# Patient Record
Sex: Male | Born: 1955 | Race: Black or African American | Hispanic: No | Marital: Married | Smoking: Former smoker
Health system: Southern US, Community
[De-identification: ages and names within clinical notes are randomized; demographics above are authoritative.]

## PROBLEM LIST (undated history)

## (undated) DIAGNOSIS — B20 Human immunodeficiency virus [HIV] disease: Secondary | ICD-10-CM

## (undated) DIAGNOSIS — F329 Major depressive disorder, single episode, unspecified: Secondary | ICD-10-CM

## (undated) DIAGNOSIS — Z21 Asymptomatic human immunodeficiency virus [HIV] infection status: Secondary | ICD-10-CM

## (undated) DIAGNOSIS — Z720 Tobacco use: Secondary | ICD-10-CM

## (undated) DIAGNOSIS — E119 Type 2 diabetes mellitus without complications: Secondary | ICD-10-CM

## (undated) DIAGNOSIS — F32A Depression, unspecified: Secondary | ICD-10-CM

## (undated) HISTORY — DX: Tobacco use: Z72.0

## (undated) HISTORY — DX: Human immunodeficiency virus (HIV) disease: B20

## (undated) HISTORY — DX: Major depressive disorder, single episode, unspecified: F32.9

## (undated) HISTORY — PX: OTHER SURGICAL HISTORY: SHX169

## (undated) HISTORY — DX: Depression, unspecified: F32.A

## (undated) HISTORY — DX: Asymptomatic human immunodeficiency virus (hiv) infection status: Z21

---

## 2014-05-03 ENCOUNTER — Encounter: Payer: Self-pay | Admitting: Internal Medicine

## 2014-05-03 ENCOUNTER — Ambulatory Visit (INDEPENDENT_AMBULATORY_CARE_PROVIDER_SITE_OTHER): Payer: Managed Care, Other (non HMO) | Admitting: Internal Medicine

## 2014-05-03 ENCOUNTER — Other Ambulatory Visit (INDEPENDENT_AMBULATORY_CARE_PROVIDER_SITE_OTHER): Payer: Managed Care, Other (non HMO)

## 2014-05-03 VITALS — BP 124/86 | HR 62 | Temp 98.0°F | Resp 12 | Ht 69.0 in | Wt 151.8 lb

## 2014-05-03 DIAGNOSIS — Z0000 Encounter for general adult medical examination without abnormal findings: Secondary | ICD-10-CM

## 2014-05-03 DIAGNOSIS — D171 Benign lipomatous neoplasm of skin and subcutaneous tissue of trunk: Secondary | ICD-10-CM | POA: Insufficient documentation

## 2014-05-03 DIAGNOSIS — Z87891 Personal history of nicotine dependence: Secondary | ICD-10-CM | POA: Insufficient documentation

## 2014-05-03 DIAGNOSIS — Z720 Tobacco use: Secondary | ICD-10-CM

## 2014-05-03 LAB — CBC
HCT: 45.5 % (ref 39.0–52.0)
Hemoglobin: 15.1 g/dL (ref 13.0–17.0)
MCHC: 33.1 g/dL (ref 30.0–36.0)
MCV: 81.4 fl (ref 78.0–100.0)
Platelets: 243 10*3/uL (ref 150.0–400.0)
RBC: 5.6 Mil/uL (ref 4.22–5.81)
RDW: 13.3 % (ref 11.5–15.5)
WBC: 6.9 10*3/uL (ref 4.0–10.5)

## 2014-05-03 LAB — LIPID PANEL
CHOLESTEROL: 132 mg/dL (ref 0–200)
HDL: 30.6 mg/dL — ABNORMAL LOW (ref >39.00)
LDL CALC: 80 mg/dL (ref 0–99)
NonHDL: 101.4
TRIGLYCERIDES: 109 mg/dL (ref 0.0–149.0)
Total CHOL/HDL Ratio: 4
VLDL: 21.8 mg/dL (ref 0.0–40.0)

## 2014-05-03 LAB — BASIC METABOLIC PANEL
BUN: 12 mg/dL (ref 6–23)
CALCIUM: 8.7 mg/dL (ref 8.4–10.5)
CO2: 26 mEq/L (ref 19–32)
Chloride: 107 mEq/L (ref 96–112)
Creatinine, Ser: 0.9 mg/dL (ref 0.4–1.5)
GFR: 108.6 mL/min (ref >60.00)
GLUCOSE: 102 mg/dL — AB (ref 70–99)
Potassium: 3.6 mEq/L (ref 3.5–5.1)
Sodium: 138 mEq/L (ref 135–145)

## 2014-05-03 NOTE — Progress Notes (Signed)
Pre visit review using our clinic review tool, if applicable. No additional management support is needed unless otherwise documented below in the visit note. 

## 2014-05-03 NOTE — Progress Notes (Signed)
   Subjective:    Patient ID: Ronald Castaneda, male    DOB: February 06, 1956, 58 y.o.   MRN: [id number]  HPI The patient is a 58 year old male who comes in to establish care today. He has no known past medical history. He does have a spot on his back he wants me to look at. He denies any chest pain, shortness of breath, cough, abdominal pains, diarrhea, constipation, GERD. He denies any joint pain or swelling. He has never had colonoscopy. He does not wish any immunizations today. He is a half pack per day smoker and does not wish to quit at this time although he has thought about it.   Review of Systems  Constitutional: Negative for fever, activity change, appetite change, fatigue and unexpected weight change.  HENT: Negative.   Respiratory: Negative for cough, chest tightness, shortness of breath and wheezing.   Cardiovascular: Negative for chest pain, palpitations and leg swelling.  Gastrointestinal: Negative for abdominal pain, diarrhea, constipation and abdominal distention.  Musculoskeletal: Positive for back pain. Negative for myalgias and arthralgias.       Occasional stiffness after work  Skin: Negative.   Neurological: Negative.       Objective:   Physical Exam  Constitutional: He is oriented to person, place, and time. He appears well-developed and well-nourished. No distress.  HENT:  Head: Normocephalic and atraumatic.  Eyes: EOM are normal.  Neck: Normal range of motion.  Cardiovascular: Normal rate and regular rhythm.   Pulmonary/Chest: Effort normal and breath sounds normal. No respiratory distress. He has no wheezes. He has no rales.  Abdominal: Soft. Bowel sounds are normal. He exhibits no distension. There is no tenderness. There is no rebound.  Neurological: He is alert and oriented to person, place, and time. Coordination normal.  Skin: Skin is warm and dry.  1 cm diameter lipoma upper left scapula.   Filed Vitals:   05/03/14 0845  BP: 124/86  Pulse: 62  Temp: 98 F  (36.7 C)  TempSrc: Oral  Resp: 12  Height: 5\' 9"  (1.753 m)  Weight: 151 lb 12.8 oz (68.856 kg)  SpO2: 98%      Assessment & Plan:

## 2014-05-03 NOTE — Assessment & Plan Note (Signed)
Lesion on back lipoma, spoke with him about removal for cosmetic reasons or if it's irritated. No need for intervention at this time.

## 2014-05-03 NOTE — Patient Instructions (Addendum)
The spot on your back is called a lipoma. This means that it's a little bit of fat that is under the skin. It is not harmful and not cancerous. If it ever hurts you or gets irritated and starts bleeding we can send you to a skin doctor who can take it off. If it is not bothering you we did not need to do anything about it. It may grow a little bit over the years.  We will check your blood work and call you back with the results. Think about getting colon cancer screening.  Colonoscopy A colonoscopy is an exam to look at your colon. This exam can help find lumps (tumors), growths (polyps), bleeding, and redness and puffiness (inflammation) in your colon.  BEFORE THE PROCEDURE  Ask your doctor about changing or stopping your regular medicines.  You may need to drink a large amount of a special liquid (oral bowel prep). You start drinking this the day before your procedure. It will cause you to have watery poop (stool). This cleans out your colon.  Do not eat or drink anything else once you have started the bowel prep, unless your doctor tells you it is safe to do so.  Make plans for someone to drive you home after the procedure. PROCEDURE  You will be given medicine to help you relax (sedative).  You will lie on your side with your knees bent.  A tube with a camera on the end is put in the opening of your butt (anus) and into your colon. Pictures are sent to a computer screen. Your doctor will look for anything that is not normal.  Your doctor may take a tissue sample (biopsy) from your colon to be looked at more closely.  The exam is finished when your doctor has viewed all of the colon. AFTER THE PROCEDURE  Do not drive for 24 hours after the exam.  You may have a small amount of blood in your poop. This is normal.  You may pass gas and have belly (abdominal) cramps. This is normal.  Ask when your test results will be ready. Make sure you get your test results. Document Released:  06/12/2010 Document Revised: 05/15/2013 Document Reviewed: 01/15/2013 [facility] Patient Information 2015 [place], [place]. This information is not intended to replace advice given to you by your health care provider. Make sure you discuss any questions you have with your health care provider.

## 2014-05-03 NOTE — Assessment & Plan Note (Signed)
Talked with patient about colonoscopy and explained procedure and risk of colon cancer. He did understand and asked several questions. He wishes to think further about it at this time and provided more information for him to take home and read. He declines all immunizations today. Check lipid panel, BMP, CBC.

## 2014-05-03 NOTE — Assessment & Plan Note (Signed)
Patient smokes half pack per day. Talked to him about cessation and he thinks now is not a good time. He has thought about it in the past but just doesn't want to try right now. Reminded him about the risks and harms from cigarette smoke.

## 2015-02-20 ENCOUNTER — Ambulatory Visit (INDEPENDENT_AMBULATORY_CARE_PROVIDER_SITE_OTHER): Payer: BLUE CROSS/BLUE SHIELD | Admitting: Internal Medicine

## 2015-02-20 ENCOUNTER — Encounter: Payer: Self-pay | Admitting: Internal Medicine

## 2015-02-20 ENCOUNTER — Other Ambulatory Visit (INDEPENDENT_AMBULATORY_CARE_PROVIDER_SITE_OTHER): Payer: BLUE CROSS/BLUE SHIELD

## 2015-02-20 VITALS — BP 132/84 | HR 68 | Temp 99.5°F | Resp 12 | Ht 69.5 in | Wt 157.4 lb

## 2015-02-20 DIAGNOSIS — D171 Benign lipomatous neoplasm of skin and subcutaneous tissue of trunk: Secondary | ICD-10-CM

## 2015-02-20 DIAGNOSIS — Z0000 Encounter for general adult medical examination without abnormal findings: Secondary | ICD-10-CM

## 2015-02-20 DIAGNOSIS — Z720 Tobacco use: Secondary | ICD-10-CM | POA: Diagnosis not present

## 2015-02-20 LAB — BASIC METABOLIC PANEL
BUN: 12 mg/dL (ref 6–23)
CHLORIDE: 103 meq/L (ref 96–112)
CO2: 33 mEq/L — ABNORMAL HIGH (ref 19–32)
Calcium: 9.3 mg/dL (ref 8.4–10.5)
Creatinine, Ser: 0.95 mg/dL (ref 0.40–1.50)
GFR: 104.36 mL/min (ref >60.00)
GLUCOSE: 106 mg/dL — AB (ref 70–99)
POTASSIUM: 4.1 meq/L (ref 3.5–5.1)
Sodium: 140 mEq/L (ref 135–145)

## 2015-02-20 LAB — HEMOGLOBIN A1C: Hgb A1c MFr Bld: 6.2 % (ref 4.6–6.5)

## 2015-02-20 LAB — LIPID PANEL
CHOLESTEROL: 153 mg/dL (ref 0–200)
HDL: 41.1 mg/dL (ref >39.00)
LDL CALC: 91 mg/dL (ref 0–99)
NonHDL: 111.68
Total CHOL/HDL Ratio: 4
Triglycerides: 104 mg/dL (ref 0.0–149.0)
VLDL: 20.8 mg/dL (ref 0.0–40.0)

## 2015-02-20 NOTE — Patient Instructions (Signed)
We will check the blood work today and call you back about the results.  We will send you to the surgeon who will take off the spot on your back.  You can use ibuprofen or tylenol for the pain in the elbow and below is the stretching and icing regimen for it.   Lateral Epicondylitis (Tennis Elbow) with Rehab Lateral epicondylitis involves inflammation and pain around the outer portion of the elbow. The pain is caused by inflammation of the tendons in the forearm that bring back (extend) the wrist. Lateral epicondylitis is also called tennis elbow, because it is very common in tennis players. However, it may occur in any individual who extends the wrist repetitively. If lateral epicondylitis is left untreated, it may become a chronic problem. SYMPTOMS   Pain, tenderness, and inflammation on the outer (lateral) side of the elbow.  Pain or weakness with gripping activities.  Pain that increases with wrist-twisting motions (playing tennis, using a screwdriver, opening a door or a jar).  Pain with lifting objects, including a coffee cup. CAUSES  Lateral epicondylitis is caused by inflammation of the tendons that extend the wrist. Causes of injury may include:  Repetitive stress and strain on the muscles and tendons that extend the wrist.  Sudden change in activity level or intensity.  Incorrect grip in racquet sports.  Incorrect grip size of racquet (often too large).  Incorrect hitting position or technique (usually backhand, leading with the elbow).  Using a racket that is too heavy. RISK INCREASES WITH:  Sports or occupations that require repetitive and/or strenuous forearm and wrist movements (tennis, squash, racquetball, carpentry).  Poor wrist and forearm strength and flexibility.  Failure to warm up properly before activity.  Resuming activity before healing, rehabilitation, and conditioning are complete. PREVENTION   Warm up and stretch properly before  activity.  Maintain physical fitness:  Strength, flexibility, and endurance.  Cardiovascular fitness.  Wear and use properly fitted equipment.  Learn and use proper technique and have a coach correct improper technique.  Wear a tennis elbow (counterforce) brace. PROGNOSIS  The course of this condition depends on the degree of the injury. If treated properly, acute cases (symptoms lasting less than 4 weeks) are often resolved in 2 to 6 weeks. Chronic (longer lasting cases) often resolve in 3 to 6 months but may require physical therapy. RELATED COMPLICATIONS   Frequently recurring symptoms, resulting in a chronic problem. Properly treating the problem the first time decreases frequency of recurrence.  Chronic inflammation, scarring tendon degeneration, and partial tendon tear, requiring surgery.  Delayed healing or resolution of symptoms. TREATMENT  Treatment first involves the use of ice and medicine to reduce pain and inflammation. Strengthening and stretching exercises may help reduce discomfort if performed regularly. These exercises may be performed at home if the condition is an acute injury. Chronic cases may require a referral to a physical therapist for evaluation and treatment. Your caregiver may advise a corticosteroid injection to help reduce inflammation. Rarely, surgery is needed. MEDICATION  If pain medicine is needed, nonsteroidal anti-inflammatory medicines (aspirin and ibuprofen), or other minor pain relievers (acetaminophen), are often advised.  Do not take pain medicine for 7 days before surgery.  Prescription pain relievers may be given, if your caregiver thinks they are needed. Use only as directed and only as much as you need.  Corticosteroid injections may be recommended. These injections should be reserved only for the most severe cases, because they can only be given a certain number of  times. HEAT AND COLD  Cold treatment (icing) should be applied for 10  to 15 minutes every 2 to 3 hours for inflammation and pain, and immediately after activity that aggravates your symptoms. Use ice packs or an ice massage.  Heat treatment may be used before performing stretching and strengthening activities prescribed by your caregiver, physical therapist, or athletic trainer. Use a heat pack or a warm water soak. SEEK MEDICAL CARE IF: Symptoms get worse or do not improve in 2 weeks, despite treatment. EXERCISES  RANGE OF MOTION (ROM) AND STRETCHING EXERCISES - Epicondylitis, Lateral (Tennis Elbow) These exercises may help you when beginning to rehabilitate your injury. Your symptoms may go away with or without further involvement from your physician, physical therapist, or athletic trainer. While completing these exercises, remember:   Restoring tissue flexibility helps normal motion to return to the joints. This allows healthier, less painful movement and activity.  An effective stretch should be held for at least 30 seconds.  A stretch should never be painful. You should only feel a gentle lengthening or release in the stretched tissue. RANGE OF MOTION - Wrist Flexion, Active-Assisted  Extend your right / left elbow with your fingers pointing down.*  Gently pull the back of your hand towards you, until you feel a gentle stretch on the top of your forearm.  Hold this position for __________ seconds. Repeat __________ times. Complete this exercise __________ times per day.  *If directed by your physician, physical therapist or athletic trainer, complete this stretch with your elbow bent, rather than extended. RANGE OF MOTION - Wrist Extension, Active-Assisted  Extend your right / left elbow and turn your palm upwards.*  Gently pull your palm and fingertips back, so your wrist extends and your fingers point more toward the ground.  You should feel a gentle stretch on the inside of your forearm.  Hold this position for __________ seconds. Repeat  __________ times. Complete this exercise __________ times per day. *If directed by your physician, physical therapist or athletic trainer, complete this stretch with your elbow bent, rather than extended. STRETCH - Wrist Flexion  Place the back of your right / left hand on a tabletop, leaving your elbow slightly bent. Your fingers should point away from your body.  Gently press the back of your hand down onto the table by straightening your elbow. You should feel a stretch on the top of your forearm.  Hold this position for __________ seconds. Repeat __________ times. Complete this stretch __________ times per day.  STRETCH - Wrist Extension   Place your right / left fingertips on a tabletop, leaving your elbow slightly bent. Your fingers should point backwards.  Gently press your fingers and palm down onto the table by straightening your elbow. You should feel a stretch on the inside of your forearm.  Hold this position for __________ seconds. Repeat __________ times. Complete this stretch __________ times per day.  STRENGTHENING EXERCISES - Epicondylitis, Lateral (Tennis Elbow) These exercises may help you when beginning to rehabilitate your injury. They may resolve your symptoms with or without further involvement from your physician, physical therapist, or athletic trainer. While completing these exercises, remember:   Muscles can gain both the endurance and the strength needed for everyday activities through controlled exercises.  Complete these exercises as instructed by your physician, physical therapist or athletic trainer. Increase the resistance and repetitions only as guided.  You may experience muscle soreness or fatigue, but the pain or discomfort you are trying to eliminate  should never worsen during these exercises. If this pain does get worse, stop and make sure you are following the directions exactly. If the pain is still present after adjustments, discontinue the exercise  until you can discuss the trouble with your caregiver. STRENGTH - Wrist Flexors  Sit with your right / left forearm palm-up and fully supported on a table or countertop. Your elbow should be resting below the height of your shoulder. Allow your wrist to extend over the edge of the surface.  Loosely holding a __________ weight, or a piece of rubber exercise band or tubing, slowly curl your hand up toward your forearm.  Hold this position for __________ seconds. Slowly lower the wrist back to the starting position in a controlled manner. Repeat __________ times. Complete this exercise __________ times per day.  STRENGTH - Wrist Extensors  Sit with your right / left forearm palm-down and fully supported on a table or countertop. Your elbow should be resting below the height of your shoulder. Allow your wrist to extend over the edge of the surface.  Loosely holding a __________ weight, or a piece of rubber exercise band or tubing, slowly curl your hand up toward your forearm.  Hold this position for __________ seconds. Slowly lower the wrist back to the starting position in a controlled manner. Repeat __________ times. Complete this exercise __________ times per day.  STRENGTH - Ulnar Deviators  Stand with a ____________________ weight in your right / left hand, or sit while holding a rubber exercise band or tubing, with your healthy arm supported on a table or countertop.  Move your wrist, so that your pinkie travels toward your forearm and your thumb moves away from your forearm.  Hold this position for __________ seconds and then slowly lower the wrist back to the starting position. Repeat __________ times. Complete this exercise __________ times per day STRENGTH - Radial Deviators  Stand with a ____________________ weight in your right / left hand, or sit while holding a rubber exercise band or tubing, with your injured arm supported on a table or countertop.  Raise your hand upward in  front of you or pull up on the rubber tubing.  Hold this position for __________ seconds and then slowly lower the wrist back to the starting position. Repeat __________ times. Complete this exercise __________ times per day. STRENGTH - Forearm Supinators   Sit with your right / left forearm supported on a table, keeping your elbow below shoulder height. Rest your hand over the edge, palm down.  Gently grip a hammer or a soup ladle.  Without moving your elbow, slowly turn your palm and hand upward to a "thumbs-up" position.  Hold this position for __________ seconds. Slowly return to the starting position. Repeat __________ times. Complete this exercise __________ times per day.  STRENGTH - Forearm Pronators   Sit with your right / left forearm supported on a table, keeping your elbow below shoulder height. Rest your hand over the edge, palm up.  Gently grip a hammer or a soup ladle.  Without moving your elbow, slowly turn your palm and hand upward to a "thumbs-up" position.  Hold this position for __________ seconds. Slowly return to the starting position. Repeat __________ times. Complete this exercise __________ times per day.  STRENGTH - Grip  Grasp a tennis ball, a dense sponge, or a large, rolled sock in your hand.  Squeeze as hard as you can, without increasing any pain.  Hold this position for __________ seconds. Release  your grip slowly. Repeat __________ times. Complete this exercise __________ times per day.  STRENGTH - Elbow Extensors, Isometric  Stand or sit upright, on a firm surface. Place your right / left arm so that your palm faces your stomach, and it is at the height of your waist.  Place your opposite hand on the underside of your forearm. Gently push up as your right / left arm resists. Push as hard as you can with both arms, without causing any pain or movement at your right / left elbow. Hold this stationary position for __________ seconds. Gradually  release the tension in both arms. Allow your muscles to relax completely before repeating. Document Released: 05/10/2005 Document Revised: 09/24/2013 Document Reviewed: 08/22/2008 [facility] Patient Information 2015 [place], [place]. This information is not intended to replace advice given to you by your health care provider. Make sure you discuss any questions you have with your health care provider.

## 2015-02-20 NOTE — Assessment & Plan Note (Signed)
Referral to general surgery as he wants removed. Reassured him that this is non-cancerous.

## 2015-02-20 NOTE — Progress Notes (Signed)
   Subjective:    Patient ID: Ronald Castaneda, male    DOB: 1955/10/20, 59 y.o.   MRN: [id number]  HPI The patient is a 59 YO man here for wellness. No new concerns. 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 Still smoking some but less than last year.   PMH, [facility], social history reviewed and updated.   Review of Systems  Constitutional: Negative for fever, activity change, appetite change, fatigue and unexpected weight change.  HENT: Negative.   Respiratory: Negative for cough, chest tightness, shortness of breath and wheezing.   Cardiovascular: Negative for chest pain, palpitations and leg swelling.  Gastrointestinal: Negative for abdominal pain, diarrhea, constipation and abdominal distention.  Musculoskeletal: Positive for arthralgias. Negative for myalgias and back pain.       Right elbow  Skin: Negative.   Neurological: Negative.       Objective:   Physical Exam  Constitutional: He is oriented to person, place, and time. He appears well-developed and well-nourished. No distress.  HENT:  Head: Normocephalic and atraumatic.  Eyes: EOM are normal.  Neck: Normal range of motion.  Cardiovascular: Normal rate and regular rhythm.   Pulmonary/Chest: Effort normal and breath sounds normal. No respiratory distress. He has no wheezes. He has no rales.  Abdominal: Soft. Bowel sounds are normal. He exhibits no distension. There is no tenderness. There is no rebound.  Neurological: He is alert and oriented to person, place, and time. Coordination normal.  Skin: Skin is warm and dry.  1 cm diameter lipoma upper left scapula.   Filed Vitals:   02/20/15 1053  BP: 132/84  Pulse: 68  Temp: 99.5 F (37.5 C)  TempSrc: Oral  Resp: 12  Height: 5' 9.5" (1.765 m)  Weight: 157 lb 6.4 oz (71.396 kg)  SpO2: 99%      Assessment & Plan:

## 2015-02-20 NOTE — Assessment & Plan Note (Signed)
Still smoking but less than last year. He has thought about quitting but feels that this is not a good option at this time.

## 2015-02-20 NOTE — Assessment & Plan Note (Signed)
Patient does not want colonoscopy. Declines all immunizations. Checking labs including screening for hep c and HIV. Counseled him about the risks and harms of cigarette smoke.

## 2015-02-20 NOTE — Progress Notes (Signed)
Pre visit review using our clinic review tool, if applicable. No additional management support is needed unless otherwise documented below in the visit note. 

## 2015-02-21 LAB — HIV 1/2 CONFIRMATION
HIV 1 ANTIBODY: POSITIVE — AB
HIV-2 Ab: NEGATIVE

## 2015-02-21 LAB — HIV ANTIBODY (ROUTINE TESTING W REFLEX): HIV: REACTIVE — AB

## 2015-02-21 LAB — HEPATITIS C ANTIBODY: HCV AB: NEGATIVE

## 2015-02-24 ENCOUNTER — Telehealth: Payer: Self-pay | Admitting: Geriatric Medicine

## 2015-02-24 ENCOUNTER — Other Ambulatory Visit: Payer: Self-pay | Admitting: Internal Medicine

## 2015-02-24 DIAGNOSIS — Z21 Asymptomatic human immunodeficiency virus [HIV] infection status: Secondary | ICD-10-CM

## 2015-02-24 NOTE — Telephone Encounter (Signed)
-----   Message from Hoyt Koch, MD sent at 02/24/2015  8:08 AM EDT ----- Please have him come for a visit to discuss some abnormal labs.

## 2015-02-24 NOTE — Telephone Encounter (Signed)
Tried to reach patient. I left a message for patient to call me back to discuss lab results.

## 2015-02-27 ENCOUNTER — Other Ambulatory Visit: Payer: BLUE CROSS/BLUE SHIELD

## 2015-02-27 ENCOUNTER — Ambulatory Visit (INDEPENDENT_AMBULATORY_CARE_PROVIDER_SITE_OTHER): Payer: BLUE CROSS/BLUE SHIELD | Admitting: Internal Medicine

## 2015-02-27 ENCOUNTER — Encounter: Payer: Self-pay | Admitting: Internal Medicine

## 2015-02-27 VITALS — BP 136/80 | HR 75 | Temp 98.6°F | Resp 14 | Ht 69.5 in | Wt 151.0 lb

## 2015-02-27 DIAGNOSIS — Z21 Asymptomatic human immunodeficiency virus [HIV] infection status: Secondary | ICD-10-CM | POA: Diagnosis not present

## 2015-02-27 DIAGNOSIS — B20 Human immunodeficiency virus [HIV] disease: Secondary | ICD-10-CM | POA: Insufficient documentation

## 2015-02-27 NOTE — Progress Notes (Signed)
   Subjective:    Patient ID: Ronald Castaneda, male    DOB: 01/08/1956, 59 y.o.   MRN: [id number]  HPI The patient is a 59 YO man coming in for new diagnosed HIV. He is with one partner (his wife) and has been married about 1 year. He denies any extramarital partners. Has never been tested for HIV per his knowledge. Not feeling bad or any flu-like symptoms.   Review of Systems  Constitutional: Negative for fever, activity change, appetite change, fatigue and unexpected weight change.  HENT: Negative.   Respiratory: Negative for cough, chest tightness, shortness of breath and wheezing.   Cardiovascular: Negative for chest pain, palpitations and leg swelling.  Gastrointestinal: Negative for abdominal pain, diarrhea, constipation and abdominal distention.  Musculoskeletal: Positive for arthralgias. Negative for myalgias and back pain.       Right elbow  Skin: Negative.   Neurological: Negative.       Objective:   Physical Exam  Constitutional: He is oriented to person, place, and time. He appears well-developed and well-nourished. No distress.  HENT:  Head: Normocephalic and atraumatic.  Eyes: EOM are normal.  Neck: Normal range of motion.  Cardiovascular: Normal rate and regular rhythm.   Pulmonary/Chest: Effort normal and breath sounds normal. No respiratory distress. He has no wheezes. He has no rales.  Abdominal: Soft. Bowel sounds are normal. He exhibits no distension. There is no tenderness. There is no rebound.  Neurological: He is alert and oriented to person, place, and time. Coordination normal.  Skin: Skin is warm and dry.  1 cm diameter lipoma upper left scapula.    Filed Vitals:   02/27/15 0836  BP: 136/80  Pulse: 75  Temp: 98.6 F (37 C)  TempSrc: Oral  Resp: 14  Height: 5' 9.5" (1.765 m)  Weight: 151 lb (68.493 kg)  SpO2: 98%      Assessment & Plan:  Visit time 25 minutes: greater than 50% of that time was spent in coordination of care and counseling with the  patient on new diagnosis of HIV and methods of spread and likely course.

## 2015-02-27 NOTE — Progress Notes (Signed)
Pre visit review using our clinic review tool, if applicable. No additional management support is needed unless otherwise documented below in the visit note. 

## 2015-02-27 NOTE — Patient Instructions (Signed)
We have called the infectious disease doctor to work on getting you in to their clinic sooner. If you have not heard back from them this week call our office back.   Remember that this is not something that will take you life and can be well controlled with medication.

## 2015-02-28 ENCOUNTER — Other Ambulatory Visit: Payer: BLUE CROSS/BLUE SHIELD

## 2015-02-28 ENCOUNTER — Telehealth: Payer: Self-pay | Admitting: Geriatric Medicine

## 2015-02-28 LAB — HIV-1 RNA ULTRAQUANT REFLEX TO GENTYP+
HIV 1 RNA QUANT: 11139 {copies}/mL — AB (ref <20)
HIV-1 RNA Quant, Log: 4.05 {Log} — ABNORMAL HIGH (ref <1.30)

## 2015-02-28 LAB — RPR

## 2015-02-28 NOTE — Assessment & Plan Note (Signed)
Due to high volume of new patients the [facility] is not able to see him for several weeks. Urgent referral placed to ID for his assurance. Checking RPR, HIV quant (with reflex to genotype), and CD4 count. Talked to him about the etiology and how this is spread. Advised usage of condoms with every sexual encounter. He is somewhat in shock as he has been faithful to his wife and talked to him about the fact that he has never been screened before and could have been positive for some time. Talked to him about the prognosis of HIV in today's world versus what he may have heard about.

## 2015-02-28 NOTE — Telephone Encounter (Signed)
CD4 count did not get drawn yesterday. Cannot be added. Please find out why this test was not drawn. Thanks.

## 2015-03-05 ENCOUNTER — Telehealth: Payer: Self-pay

## 2015-03-05 NOTE — Telephone Encounter (Signed)
Patient contacted regarding new intake appointment. Date and time given. Information given regarding documents needed to qualify for financial eligibility.  Ronald Welchel K Arely Tinner, RN  

## 2015-03-08 LAB — HIV-1 GENOTYPR PLUS

## 2015-03-11 ENCOUNTER — Telehealth: Payer: Self-pay | Admitting: *Deleted

## 2015-03-11 ENCOUNTER — Ambulatory Visit: Payer: BLUE CROSS/BLUE SHIELD

## 2015-03-11 DIAGNOSIS — B20 Human immunodeficiency virus [HIV] disease: Secondary | ICD-10-CM

## 2015-03-11 DIAGNOSIS — Z202 Contact with and (suspected) exposure to infections with a predominantly sexual mode of transmission: Secondary | ICD-10-CM

## 2015-03-11 DIAGNOSIS — E785 Hyperlipidemia, unspecified: Secondary | ICD-10-CM

## 2015-03-11 DIAGNOSIS — Z111 Encounter for screening for respiratory tuberculosis: Secondary | ICD-10-CM

## 2015-03-11 LAB — CBC WITH DIFFERENTIAL/PLATELET
BASOS ABS: 0 10*3/uL (ref 0.0–0.1)
BASOS PCT: 0 % (ref 0–1)
EOS ABS: 0.1 10*3/uL (ref 0.0–0.7)
Eosinophils Relative: 1 % (ref 0–5)
HCT: 43.9 % (ref 39.0–52.0)
HEMOGLOBIN: 14.7 g/dL (ref 13.0–17.0)
Lymphocytes Relative: 32 % (ref 12–46)
Lymphs Abs: 2 10*3/uL (ref 0.7–4.0)
MCH: 26.9 pg (ref 26.0–34.0)
MCHC: 33.5 g/dL (ref 30.0–36.0)
MCV: 80.4 fL (ref 78.0–100.0)
MONOS PCT: 12 % (ref 3–12)
MPV: 10.2 fL (ref 8.6–12.4)
Monocytes Absolute: 0.8 10*3/uL (ref 0.1–1.0)
NEUTROS ABS: 3.5 10*3/uL (ref 1.7–7.7)
NEUTROS PCT: 55 % (ref 43–77)
PLATELETS: 257 10*3/uL (ref 150–400)
RBC: 5.46 MIL/uL (ref 4.22–5.81)
RDW: 15.4 % (ref 11.5–15.5)
WBC: 6.4 10*3/uL (ref 4.0–10.5)

## 2015-03-11 LAB — LIPID PANEL
CHOL/HDL RATIO: 4.6 ratio (ref <=5.0)
CHOLESTEROL: 139 mg/dL (ref 125–200)
HDL: 30 mg/dL — AB (ref >=40)
LDL Cholesterol: 71 mg/dL (ref <130)
TRIGLYCERIDES: 192 mg/dL — AB (ref <150)
VLDL: 38 mg/dL — ABNORMAL HIGH (ref <30)

## 2015-03-11 LAB — COMPLETE METABOLIC PANEL WITH GFR
ALBUMIN: 3.7 g/dL (ref 3.6–5.1)
ALK PHOS: 107 U/L (ref 40–115)
ALT: 14 U/L (ref 9–46)
AST: 22 U/L (ref 10–35)
BILIRUBIN TOTAL: 0.3 mg/dL (ref 0.2–1.2)
BUN: 14 mg/dL (ref 7–25)
CO2: 28 mmol/L (ref 20–31)
CREATININE: 0.9 mg/dL (ref 0.70–1.33)
Calcium: 9.2 mg/dL (ref 8.6–10.3)
Chloride: 105 mmol/L (ref 98–110)
GFR, Est African American: >89 mL/min (ref >=60)
GLUCOSE: 82 mg/dL (ref 65–99)
Potassium: 4.3 mmol/L (ref 3.5–5.3)
SODIUM: 142 mmol/L (ref 135–146)
TOTAL PROTEIN: 7.1 g/dL (ref 6.1–8.1)

## 2015-03-11 NOTE — Telephone Encounter (Signed)
Left msg on triage stating saw pt today he has mention that he has been very anxious, not able to sleep, or relax his mind since he was diagnose. Wanting to see would md rx something. Can notifiy pt @ [id number]...Ronald Castaneda

## 2015-03-12 LAB — URINALYSIS
Bilirubin Urine: NEGATIVE
GLUCOSE, UA: NEGATIVE
Ketones, ur: NEGATIVE
NITRITE: NEGATIVE
Specific Gravity, Urine: 1.028 (ref 1.001–1.035)
pH: 6 (ref 5.0–8.0)

## 2015-03-12 LAB — RPR

## 2015-03-12 LAB — HEPATITIS B SURFACE ANTIBODY,QUALITATIVE: Hep B S Ab: NEGATIVE

## 2015-03-12 LAB — HEPATITIS C ANTIBODY: HCV Ab: NEGATIVE

## 2015-03-12 LAB — URINE CYTOLOGY ANCILLARY ONLY
Chlamydia: NEGATIVE
Neisseria Gonorrhea: NEGATIVE

## 2015-03-12 LAB — HEPATITIS B CORE ANTIBODY, TOTAL: Hep B Core Total Ab: NONREACTIVE

## 2015-03-12 LAB — HEPATITIS B SURFACE ANTIGEN: Hepatitis B Surface Ag: NEGATIVE

## 2015-03-12 LAB — HEPATITIS A ANTIBODY, TOTAL: HEP A TOTAL AB: NONREACTIVE

## 2015-03-12 MED ORDER — BUSPIRONE HCL 10 MG PO TABS: 10.0000 mg | ORAL_TABLET | Freq: Two times a day (BID) | ORAL | Status: DC | PRN

## 2015-03-12 NOTE — Telephone Encounter (Signed)
Pls advise on msg below.../lmb 

## 2015-03-12 NOTE — Telephone Encounter (Signed)
Caren Griffins in lab was not real sure what happened with this lab---the person who actually drew blood was on vacation for the next week---but Caren Griffins is to follow up with the lab personnel to make sure we understand what happened and fix any problems that need correcting

## 2015-03-12 NOTE — Telephone Encounter (Signed)
Have sent in buspar for the anxiety that he can take up to twice daily. If not effective needs visit.

## 2015-03-12 NOTE — Telephone Encounter (Signed)
Notified pt with md response. Rx has been sent to [facility]...Johny Chess

## 2015-03-13 LAB — HIV-1 RNA ULTRAQUANT REFLEX TO GENTYP+
HIV 1 RNA Quant: 13932 copies/mL — ABNORMAL HIGH (ref <20)
HIV-1 RNA QUANT, LOG: 4.14 {Log_copies}/mL — AB (ref <1.30)

## 2015-03-13 LAB — T-HELPER CELL (CD4) - (RCID CLINIC ONLY)
CD4 T CELL HELPER: 14 % — AB (ref 33–55)
CD4 T Cell Abs: 310 /uL — ABNORMAL LOW (ref 400–2700)

## 2015-03-14 LAB — QUANTIFERON TB GOLD ASSAY (BLOOD)
INTERFERON GAMMA RELEASE ASSAY: NEGATIVE
MITOGEN VALUE: 9.51 [IU]/mL
Quantiferon Nil Value: 0.03 IU/mL
Quantiferon Tb Ag Minus Nil Value: 0 IU/mL
TB Ag value: 0.03 IU/mL

## 2015-03-16 LAB — HLA B*5701: HLA-B 5701 W/RFLX HLA-B HIGH: NEGATIVE

## 2015-03-19 NOTE — Progress Notes (Signed)
Patient is here today after testing positive for HIV during a routine physical exam.  He does not recall ever being tested for HIV.  He has been really depressed and not able to sleep since hearing the diagnosis.  His wife is present today and they both are concerned about treatment options.  His wife has tested negative for HIV at her primary care office per patient.   Wife states they are sexually active and have been using condoms on off for the last three years due to her fear of possibilities of pregnancy during menopause.  She is willing to support her husband and continue the marriage since she knows this was most likely before they were together.  Patient denies any IV drug use, sex with males or blood transfusions.  He thinks someone from his past must have been HIV infected and did not know.   In the last 4 years he has only been sexually active with his wife. Please note his wife was present doing the entire intake.  He is not able to understand how he can be so physically fit and have HIV with no symptoms present.   I explained to patient and his wife the need to use condoms each time they engage in intercourse. We discussed treatment options and how new medications with single tablet regimens have increased adherence. Ronald Castaneda is struggling with having to take daily medications for the rest of his life. I shared future treatment options and encouraged him to include taking medications into his normal morning routine.  Majority of intake was focused on current state of denial, treatment adherence,  and education related to sex in the future as a couple.   Information shared with wife for possible consideration of pre exposure prophylaxis.  HIV antibody repeated at patient's insistence.   Patients wife has asked me to call Dr Sharlet Salina and share information related to insomnia, restlessness and depression.  Message left on Dr Nathanial Millman CMA voicemail.    Patient refused vaccines.  Medical records  are in [place].   Laverle Patter, RN

## 2015-03-20 LAB — HIV-1 GENOTYPR PLUS

## 2015-04-01 ENCOUNTER — Ambulatory Visit (INDEPENDENT_AMBULATORY_CARE_PROVIDER_SITE_OTHER): Payer: BLUE CROSS/BLUE SHIELD | Admitting: Internal Medicine

## 2015-04-01 ENCOUNTER — Encounter: Payer: Self-pay | Admitting: Internal Medicine

## 2015-04-01 VITALS — BP 166/98 | HR 68 | Temp 98.5°F | Ht 69.5 in | Wt 149.8 lb

## 2015-04-01 DIAGNOSIS — B20 Human immunodeficiency virus [HIV] disease: Secondary | ICD-10-CM | POA: Diagnosis not present

## 2015-04-01 DIAGNOSIS — F329 Major depressive disorder, single episode, unspecified: Secondary | ICD-10-CM | POA: Insufficient documentation

## 2015-04-01 DIAGNOSIS — Z720 Tobacco use: Secondary | ICD-10-CM | POA: Diagnosis not present

## 2015-04-01 DIAGNOSIS — F32A Depression, unspecified: Secondary | ICD-10-CM | POA: Insufficient documentation

## 2015-04-01 MED ORDER — ELVITEG-COBIC-EMTRICIT-TENOFAF 150-150-200-10 MG PO TABS: 1.0000 | ORAL_TABLET | Freq: Every day | ORAL | Status: DC

## 2015-04-01 NOTE — Assessment & Plan Note (Signed)
He has newly diagnosed HIV infection. He has moderate reduction in his CD4 count but is asymptomatic. He is motivated to start therapy. I met with him today and begin to discuss interpretation of CD4 count and viral load and begun the process of education about living with HIV infection. He also met with Milus Glazier, our infectious disease pharmacist. We reviewed his work and meal schedule and will start him on Genvoya once daily to be taken with a meal when he gets off of work in the late evening. He will follow-up in 3 weeks. I encouraged him to have his wife attend his next visit. In addition a condom use of it suggests that she consider PreP.

## 2015-04-01 NOTE — Assessment & Plan Note (Signed)
He states that he and his wife have thought about trying to quit smoking cigarettes but currently he has no plan. I will continue to talk to him about the benefits of cigarette cessation.

## 2015-04-01 NOTE — Assessment & Plan Note (Signed)
He does have some reactive depression and insomnia. I will arrange for him to meet with our behavioral health counselor.

## 2015-04-01 NOTE — Progress Notes (Signed)
Patient Active Problem List   Diagnosis Date Noted  . HIV disease ([place]) 02/27/2015    Priority: High  . Depression 04/01/2015  . Routine general medical examination at a health care facility 05/03/2014  . Lipoma of back 05/03/2014  . Tobacco abuse 05/03/2014    Patient's Medications  New Prescriptions   ELVITEGRAVIR-COBICISTAT-EMTRICITABINE-TENOFOVIR (GENVOYA) 150-150-200-10 MG TABS TABLET    Take 1 tablet by mouth daily with breakfast.  Previous Medications   BUSPIRONE (BUSPAR) 10 MG TABLET    Take 1 tablet (10 mg total) by mouth 2 (two) times daily as needed.  Modified Medications   No medications on file  Discontinued Medications   No medications on file    Subjective: Ronald Castaneda is in for his initial visit to establish ongoing care for his recently diagnosed HIV infection. He underwent routine screening during a recent primary care visit and was found to be HIV positive. He does not recall being tested previously. He is not sure how he became infected. He has had some tattoos in the past but all from professional parlors. He's never had any transfusions. He denies any history of injecting drug use. He states that he has 85 lifetime sexual partners all male. He does not know of any history of hepatitis or sexual transmitted diseases in his partners. He's been married to his current wife for 1 year. She is HIV negative. They have been using condoms since his diagnosis.  He states that his new diagnosis has " put me in another world". He has been feeling depressed and has been having difficulty sleeping. This is slowly improving but still very difficult. His wife is aware of his diagnosis but no one else has been told. He states that he does not know much about HIV. He knew someone about 20 years ago they died of AIDS and knows that this thing "can kill you".  Review of Systems: Review of Systems  Constitutional: Negative for fever, chills, weight loss, malaise/fatigue and  diaphoresis.  HENT: Negative for sore throat.   Eyes: Negative.        He wears reading glasses.  Respiratory: Negative for cough, sputum production and shortness of breath.   Cardiovascular: Negative for chest pain.  Gastrointestinal: Negative for nausea, vomiting, abdominal pain and diarrhea.  Genitourinary: Negative for dysuria and frequency.  Musculoskeletal: Negative for myalgias and joint pain.  Skin: Negative for rash.  Neurological: Negative for focal weakness and headaches.  Psychiatric/Behavioral: Negative for depression and substance abuse. The patient has insomnia. The patient is not nervous/anxious.     Past Medical History  Diagnosis Date  . HIV infection ([place])   . Depression     Social History  Substance Use Topics  . Smoking status: Current Every Day Smoker -- 0.50 packs/day    Types: Cigarettes  . Smokeless tobacco: Never Used  . Alcohol Use: 4.2 oz/week    7 Standard drinks or equivalent per week    Family History  Problem Relation Age of Onset  . Hypertension Mother     No Known Allergies  Objective:  Filed Vitals:   04/01/15 0939  BP: 166/98  Pulse: 68  Temp: 98.5 F (36.9 C)  TempSrc: Oral  Height: 5' 9.5" (1.765 m)  Weight: 149 lb 12.8 oz (67.949 kg)   Body mass index is 21.81 kg/(m^2).  Physical Exam  Constitutional: He is oriented to person, place, and time.  He is alert and in no distress.  He is dressed in a [employer] and cap.  Eyes: Conjunctivae are normal.  Cardiovascular: Normal rate and regular rhythm.   No murmur heard. Pulmonary/Chest: Breath sounds normal.  Abdominal: Soft. He exhibits no mass. There is no tenderness.  Musculoskeletal: Normal range of motion.  Lymphadenopathy:    He has axillary adenopathy.  Neurological: He is alert and oriented to person, place, and time.  Skin: No rash noted.  Psychiatric: Mood and affect normal.    Lab Results Lab Results  Component Value Date   WBC 6.4 03/11/2015     HGB 14.7 03/11/2015   HCT 43.9 03/11/2015   MCV 80.4 03/11/2015   PLT 257 03/11/2015    Lab Results  Component Value Date   CREATININE 0.90 03/11/2015   BUN 14 03/11/2015   NA 142 03/11/2015   K 4.3 03/11/2015   CL 105 03/11/2015   CO2 28 03/11/2015    Lab Results  Component Value Date   ALT 14 03/11/2015   AST 22 03/11/2015   ALKPHOS 107 03/11/2015   BILITOT 0.3 03/11/2015    Lab Results  Component Value Date   CHOL 139 03/11/2015   HDL 30* 03/11/2015   LDLCALC 71 03/11/2015   TRIG 192* 03/11/2015   CHOLHDL 4.6 03/11/2015    Lab Results HIV 1 RNA QUANT (copies/mL)  Date Value  03/11/2015 13932*  02/27/2015 11139*   CD4 T CELL ABS (/uL)  Date Value  03/11/2015 310*      Problem List Items Addressed This Visit      High   HIV disease ([place])    He has newly diagnosed HIV infection. He has moderate reduction in his CD4 count but is asymptomatic. He is motivated to start therapy. I met with him today and begin to discuss interpretation of CD4 count and viral load and begun the process of education about living with HIV infection. He also met with Milus Glazier, our infectious disease pharmacist. We reviewed his work and meal schedule and will start him on Genvoya once daily to be taken with a meal when he gets off of work in the late evening. He will follow-up in 3 weeks. I encouraged him to have his wife attend his next visit. In addition a condom use of it suggests that she consider PreP.      Relevant Medications   elvitegravir-cobicistat-emtricitabine-tenofovir (GENVOYA) 150-150-200-10 MG TABS tablet     Unprioritized   Depression - Primary    He does have some reactive depression and insomnia. I will arrange for him to meet with our behavioral health counselor.      Tobacco abuse    He states that he and his wife have thought about trying to quit smoking cigarettes but currently he has no plan. I will continue to talk to him about the benefits of cigarette  cessation.           Michel Bickers, MD [facility] [phone] pager   [phone] cell 04/01/2015, 12:34 PM

## 2015-04-07 ENCOUNTER — Telehealth: Payer: Self-pay | Admitting: *Deleted

## 2015-04-07 NOTE — Telephone Encounter (Signed)
Patient left message in Triage asking for return call.  RN returned the call, went to voicemail.  Asked him to please call back if he still needed assistance.  Landis Gandy, RN

## 2015-04-28 ENCOUNTER — Emergency Department (HOSPITAL_COMMUNITY)
Admission: EM | Admit: 2015-04-28 | Discharge: 2015-04-28 | Disposition: A | Discharge status: Home / Self Care | Payer: BLUE CROSS/BLUE SHIELD | Attending: Emergency Medicine | Admitting: Emergency Medicine

## 2015-04-28 ENCOUNTER — Ambulatory Visit (INDEPENDENT_AMBULATORY_CARE_PROVIDER_SITE_OTHER): Payer: BLUE CROSS/BLUE SHIELD | Admitting: Family Medicine

## 2015-04-28 ENCOUNTER — Encounter (HOSPITAL_COMMUNITY): Payer: Self-pay | Admitting: Emergency Medicine

## 2015-04-28 ENCOUNTER — Emergency Department (HOSPITAL_COMMUNITY): Payer: BLUE CROSS/BLUE SHIELD

## 2015-04-28 VITALS — BP 124/80 | HR 94 | Temp 98.6°F | Resp 17 | Ht 68.5 in | Wt 143.0 lb

## 2015-04-28 DIAGNOSIS — F329 Major depressive disorder, single episode, unspecified: Secondary | ICD-10-CM | POA: Insufficient documentation

## 2015-04-28 DIAGNOSIS — R1030 Lower abdominal pain, unspecified: Secondary | ICD-10-CM | POA: Diagnosis present

## 2015-04-28 DIAGNOSIS — F17210 Nicotine dependence, cigarettes, uncomplicated: Secondary | ICD-10-CM | POA: Diagnosis not present

## 2015-04-28 DIAGNOSIS — B20 Human immunodeficiency virus [HIV] disease: Secondary | ICD-10-CM | POA: Insufficient documentation

## 2015-04-28 DIAGNOSIS — L02215 Cutaneous abscess of perineum: Secondary | ICD-10-CM

## 2015-04-28 DIAGNOSIS — Z79899 Other long term (current) drug therapy: Secondary | ICD-10-CM | POA: Diagnosis not present

## 2015-04-28 MED ORDER — HYDROCODONE-ACETAMINOPHEN 5-325 MG PO TABS
1.0000 | ORAL_TABLET | Freq: Once | ORAL | Status: AC
  Administered 2015-04-28: 1 via ORAL
  Filled 2015-04-28: qty 1

## 2015-04-28 MED ORDER — HYDROCODONE-ACETAMINOPHEN 5-325 MG PO TABS: 1.0000 | ORAL_TABLET | Freq: Four times a day (QID) | ORAL | Status: DC | PRN

## 2015-04-28 MED ORDER — CLINDAMYCIN HCL 150 MG PO CAPS: 300.0000 mg | ORAL_CAPSULE | Freq: Three times a day (TID) | ORAL | Status: DC

## 2015-04-28 MED ORDER — BUPIVACAINE HCL (PF) 0.5 % IJ SOLN
10.0000 mL | Freq: Once | INTRAMUSCULAR | Status: AC
  Administered 2015-04-28: 30 mL
  Filled 2015-04-28: qty 30

## 2015-04-28 MED ORDER — DOXYCYCLINE HYCLATE 100 MG PO TABS: 100.0000 mg | ORAL_TABLET | Freq: Two times a day (BID) | ORAL | Status: DC

## 2015-04-28 MED ORDER — HYDROCODONE-ACETAMINOPHEN 5-325 MG PO TABS: 1.0000 | ORAL_TABLET | ORAL | Status: DC | PRN

## 2015-04-28 NOTE — ED Notes (Signed)
Verbalized understanding discharge instructions. In no acute distress.   

## 2015-04-28 NOTE — ED Provider Notes (Signed)
CSN: [id number]     Arrival date & time 04/28/15  1554 History   First MD Initiated Contact with Patient 04/28/15 1604     Chief Complaint  Patient presents with  . Groin Pain     (Consider location/radiation/quality/duration/timing/severity/associated sxs/prior Treatment) Patient is a 59 y.o. male presenting with groin pain. The history is provided by the patient.  Groin Pain This is a new problem. Episode onset: 4 days ago. The problem occurs constantly. The problem has been gradually worsening. Pertinent negatives include no abdominal pain. Nothing aggravates the symptoms. Nothing relieves the symptoms. He has tried nothing for the symptoms.    Past Medical History  Diagnosis Date  . HIV infection ([place])   . Depression    History reviewed. No pertinent past surgical history. Family History  Problem Relation Age of Onset  . Hypertension Mother    Social History  Substance Use Topics  . Smoking status: Current Every Day Smoker -- 1.00 packs/day for 44 years    Types: Cigarettes  . Smokeless tobacco: Never Used  . Alcohol Use: 4.2 oz/week    7 Standard drinks or equivalent per week    Review of Systems  Gastrointestinal: Negative for abdominal pain.  All other systems reviewed and are negative.     Allergies  Review of patient's allergies indicates no known allergies.  Home Medications   Prior to Admission medications   Medication Sig Start Date End Date Taking? Authorizing Provider  busPIRone (BUSPAR) 10 MG tablet Take 1 tablet (10 mg total) by mouth 2 (two) times daily as needed. Patient not taking: Reported on 04/28/2015 03/12/15   Hoyt Koch, MD  doxycycline (VIBRA-TABS) 100 MG tablet Take 1 tablet (100 mg total) by mouth 2 (two) times daily. 04/28/15   Robyn Haber, MD  elvitegravir-cobicistat-emtricitabine-tenofovir (GENVOYA) 150-150-200-10 MG TABS tablet Take 1 tablet by mouth daily with breakfast. Patient not taking: Reported on 04/28/2015 04/01/15    Michel Bickers, MD  HYDROcodone-acetaminophen (NORCO) 5-325 MG tablet Take 1 tablet by mouth every 6 (six) hours as needed for moderate pain. 04/28/15   Robyn Haber, MD   BP 148/101 mmHg  Pulse 93  Temp(Src) 98.4 F (36.9 C) (Oral)  Resp 17  SpO2 98% Physical Exam  Constitutional: He is oriented to person, place, and time. He appears well-developed and well-nourished. No distress.  HENT:  Head: Normocephalic and atraumatic.  Eyes: Conjunctivae are normal.  Neck: Neck supple. No tracheal deviation present.  Cardiovascular: Normal rate and regular rhythm.   Pulmonary/Chest: Effort normal. No respiratory distress.  Abdominal: Soft. He exhibits no distension.  Genitourinary: Testes normal.     Induration of 2cm and tenderness at base of left scrotum overlying perineum  Neurological: He is alert and oriented to person, place, and time.  Skin: Skin is warm and dry.  Psychiatric: He has a normal mood and affect.    ED Course  Procedures (including critical care time) INCISION AND DRAINAGE Performed by: Leo Grosser Consent: Verbal consent obtained. Risks and benefits: risks, benefits and alternatives were discussed Type: abscess  Body area: left perineum posterior to scrotum  Anesthesia: local infiltration  Incision was made with a scalpel.  Local anesthetic: 0.5% bupivacaine  Anesthetic total: 10 ml  Complexity: complex Blunt dissection to break up loculations  Drainage: purulent  Drainage amount: copious  Packing material: none  Patient tolerance: Patient tolerated the procedure well with no immediate complications.     Labs Review Labs Reviewed - No data to display  Imaging Review No results found. I have personally reviewed and evaluated these images and lab results as part of my medical decision-making.   EKG Interpretation None      MDM   Final diagnoses:  Perineal abscess    59 year old male with history of HIV with CD4 count of 300  presents with swelling and pain of the perineum at the base of the left side of the scrotum. No fevers, difficulty with urination, scrotal pain or pain with defecation. 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 Scrotal ultrasound ordered to evaluate sensitive structures to exclude extension into these areas.  NO extension to testis, appears isolated to perineum posterior to scrotum. I&D as above with large amount of pus extracted. Placed on ABx d/t immunosuppressed status. Plan to follow up with PCP as needed and return precautions discussed for worsening or new concerning symptoms.   Leo Grosser, MD 04/29/15 [phone]

## 2015-04-28 NOTE — ED Notes (Signed)
US in with pt. 

## 2015-04-28 NOTE — Progress Notes (Signed)
@UMFCLOGO @  By signing my name below, I, Raven Small, attest that this documentation has been prepared under the direction and in the presence of Robyn Haber, MD.  Electronically Signed: Thea Alken, ED Scribe. 04/28/2015. 3:18 PM.  Patient ID: Ronald Castaneda MRN: [id number], DOB: 1955-07-18, 59 y.o. Date of Encounter: 04/28/2015, 3:18 PM  Primary Physician: Hoyt Koch, MD  Chief Complaint:  Chief Complaint  Patient presents with  . Groin Pain  . Hip Pain    HPI: 59 y.o. year old male with history of HIV presents with sudden onset of groin pain for that began 4 days ago. He denies known injury or fall but states he could have injured himself at work. He has pain with driving and with sitting up. He has tried warm soaks without relief. He denies pain with urination and blood in urine.  Pt drives a forklift for work.    Past Medical History  Diagnosis Date  . HIV infection ([place])   . Depression      Home Meds: Prior to Admission medications   Medication Sig Start Date End Date Taking? Authorizing Provider  busPIRone (BUSPAR) 10 MG tablet Take 1 tablet (10 mg total) by mouth 2 (two) times daily as needed. Patient not taking: Reported on 04/28/2015 03/12/15   Hoyt Koch, MD  elvitegravir-cobicistat-emtricitabine-tenofovir (GENVOYA) 150-150-200-10 MG TABS tablet Take 1 tablet by mouth daily with breakfast. Patient not taking: Reported on 04/28/2015 04/01/15   Michel Bickers, MD    Allergies: No Known Allergies  Social History   Social History  . Marital Status: Unknown    Spouse Name: N/A  . Number of Children: N/A  . Years of Education: N/A   Occupational History  . Not on file.   Social History Main Topics  . Smoking status: Current Every Day Smoker -- 1.00 packs/day for 44 years    Types: Cigarettes  . Smokeless tobacco: Never Used  . Alcohol Use: 4.2 oz/week    7 Standard drinks or equivalent per week  . Drug Use: No  . Sexual Activity:     Partners: Female    Patent examiner Protection: Condom   Other Topics Concern  . Not on file   Social History Narrative     Review of Systems: Constitutional: negative for chills, fever, night sweats, weight changes, or fatigue  HEENT: negative for vision changes, hearing loss, congestion, rhinorrhea, ST, epistaxis, or sinus pressure Cardiovascular: negative for chest pain or palpitations Respiratory: negative for hemoptysis, wheezing, shortness of breath, or cough Abdominal: negative for abdominal pain, nausea, vomiting, diarrhea, or constipation Dermatological: negative for rash Neurologic: negative for headache, dizziness, or syncope All other systems reviewed and are otherwise negative with the exception to those above and in the HPI.   Physical Exam: Blood pressure 124/80, pulse 94, temperature 98.6 F (37 C), temperature source Oral, resp. rate 17, height 5' 8.5" (1.74 m), weight 143 lb (64.864 kg), SpO2 97 %., Body mass index is 21.42 kg/(m^2). General: Well developed, well nourished, in no acute distress. Head: Normocephalic, atraumatic, eyes without discharge, sclera non-icteric, nares are without discharge. Bilateral auditory canals clear, TM's are without perforation, pearly grey and translucent with reflective cone of light bilaterally. Oral cavity moist, posterior pharynx without exudate, erythema, peritonsillar abscess, or post nasal drip.  Neck: Supple. No thyromegaly. Full ROM. No lymphadenopathy. Msk:  Strength and tone normal for age. Extremities/Skin: Warm and dry. No clubbing or cyanosis. No edema. No rashes or suspicious lesions. Neuro: Alert and oriented  X 3. Moves all extremities spontaneously. Gait is normal. CNII-XII grossly in tact. Psych:  Responds to questions appropriately with a normal affect. Genitals: Tender indurated mass in perineum measuring 1x2 cm.    ASSESSMENT AND PLAN:  59 y.o. year old HIV positive male with perineal abscess that is become  acutely inflamed, enlarged, and difficult to tolerate any longer.  I spoke with Dr. Junious Silk who recommended the patient be transferred to [facility] long ED for further evaluation  This chart was scribed in my presence and reviewed by me personally.    ICD-9-CM ICD-10-CM   1. Abscess, perineum 682.2 L02.215 doxycycline (VIBRA-TABS) 100 MG tablet     HYDROcodone-acetaminophen (NORCO) 5-325 MG tablet     Ambulatory referral to Urology     Signed, Robyn Haber, MD 04/28/2015 3:18 PM

## 2015-04-28 NOTE — Discharge Instructions (Signed)
Incision and Drainage °Incision and drainage is a procedure in which a sac-like structure (cystic structure) is opened and drained. The area to be drained usually contains material such as pus, fluid, or blood.  °LET YOUR CAREGIVER KNOW ABOUT:  °· Allergies to medicine. °· Medicines taken, including vitamins, herbs, eyedrops, over-the-counter medicines, and creams. °· Use of steroids (by mouth or creams). °· Previous problems with anesthetics or numbing medicines. °· History of bleeding problems or blood clots. °· Previous surgery. °· Other health problems, including diabetes and kidney problems. °· Possibility of pregnancy, if this applies. °RISKS AND COMPLICATIONS °· Pain. °· Bleeding. °· Scarring. °· Infection. °BEFORE THE PROCEDURE  °You may need to have an ultrasound or other imaging tests to see how large or deep your cystic structure is. Blood tests may also be used to determine if you have an infection or how severe the infection is. You may need to have a tetanus shot. °PROCEDURE  °The affected area is cleaned with a cleaning fluid. The cyst area will then be numbed with a medicine (local anesthetic). A small incision will be made in the cystic structure. A syringe or catheter may be used to drain the contents of the cystic structure, or the contents may be squeezed out. The area will then be flushed with a cleansing solution. After cleansing the area, it is often gently packed with a gauze or another wound dressing. Once it is packed, it will be covered with gauze and tape or some other type of wound dressing.  °AFTER THE PROCEDURE  °· Often, you will be allowed to go home right after the procedure. °· You may be given antibiotic medicine to prevent or heal an infection. °· If the area was packed with gauze or some other wound dressing, you will likely need to come back in 1 to 2 days to get it removed. °· The area should heal in about 14 days. °  °This information is not intended to replace advice given  to you by your health care provider. Make sure you discuss any questions you have with your health care provider. °  °Document Released: 11/03/2000 Document Revised: 11/09/2011 Document Reviewed: 07/05/2011 °Elsevier Interactive Patient Education ©2016 Elsevier Inc[employer] ° °Abscess °An abscess is an infected area that contains a collection of pus and debris. It can occur in almost any part of the body. An abscess is also known as a furuncle or boil. °CAUSES  °An abscess occurs when tissue gets infected. This can occur from blockage of oil or sweat glands, infection of hair follicles, or a minor injury to the skin. As the body tries to fight the infection, pus collects in the area and creates pressure under the skin. This pressure causes pain. People with weakened immune systems have difficulty fighting infections and get certain abscesses more often.  °SYMPTOMS °Usually an abscess develops on the skin and becomes a painful mass that is red, warm, and tender. If the abscess forms under the skin, you may feel a moveable soft area under the skin. Some abscesses break open (rupture) on their own, but most will continue to get worse without care. The infection can spread deeper into the body and eventually into the bloodstream, causing you to feel ill.  °DIAGNOSIS  °Your caregiver will take your medical history and perform a physical exam. A sample of fluid may also be taken from the abscess to determine what is causing your infection. °TREATMENT  °Your caregiver may prescribe antibiotic medicines to fight the infection.   However, taking antibiotics alone usually does not cure an abscess. Your caregiver may need to make a small cut (incision) in the abscess to drain the pus. In some cases, gauze is packed into the abscess to reduce pain and to continue draining the area. °HOME CARE INSTRUCTIONS  °· Only take over-the-counter or prescription medicines for pain, discomfort, or fever as directed by your caregiver. °· If you were  prescribed antibiotics, take them as directed. Finish them even if you start to feel better. °· If gauze is used, follow your caregiver's directions for changing the gauze. °· To avoid spreading the infection: °¨ Keep your draining abscess covered with a bandage. °¨ Wash your hands well. °¨ Do not share personal care items, towels, or whirlpools with others. °¨ Avoid skin contact with others. °· Keep your skin and clothes clean around the abscess. °· Keep all follow-up appointments as directed by your caregiver. °SEEK MEDICAL CARE IF:  °· You have increased pain, swelling, redness, fluid drainage, or bleeding. °· You have muscle aches, chills, or a general ill feeling. °· You have a fever. °MAKE SURE YOU:  °· Understand these instructions. °· Will watch your condition. °· Will get help right away if you are not doing well or get worse. °  °This information is not intended to replace advice given to you by your health care provider. Make sure you discuss any questions you have with your health care provider. °  °Document Released: 02/17/2005 Document Revised: 11/09/2011 Document Reviewed: 07/23/2011 °Elsevier Interactive Patient Education ©2016 Elsevier Inc. ° °

## 2015-04-28 NOTE — Patient Instructions (Addendum)
  Go directly to [facility] emergency department where he will be evaluated for surgery   Abscess An abscess is an infected area that contains a collection of pus and debris.It can occur in almost any part of the body. An abscess is also known as a furuncle or boil. CAUSES  An abscess occurs when tissue gets infected. This can occur from blockage of oil or sweat glands, infection of hair follicles, or a minor injury to the skin. As the body tries to fight the infection, pus collects in the area and creates pressure under the skin. This pressure causes pain. People with weakened immune systems have difficulty fighting infections and get certain abscesses more often.  SYMPTOMS Usually an abscess develops on the skin and becomes a painful mass that is red, warm, and tender. If the abscess forms under the skin, you may feel a moveable soft area under the skin. Some abscesses break open (rupture) on their own, but most will continue to get worse without care. The infection can spread deeper into the body and eventually into the bloodstream, causing you to feel ill.  DIAGNOSIS  Your caregiver will take your medical history and perform a physical exam. A sample of fluid may also be taken from the abscess to determine what is causing your infection. TREATMENT  Your caregiver may prescribe antibiotic medicines to fight the infection. However, taking antibiotics alone usually does not cure an abscess. Your caregiver may need to make a small cut (incision) in the abscess to drain the pus. In some cases, gauze is packed into the abscess to reduce pain and to continue draining the area. HOME CARE INSTRUCTIONS   Only take over-the-counter or prescription medicines for pain, discomfort, or fever as directed by your caregiver.  If you were prescribed antibiotics, take them as directed. Finish them even if you start to feel better.  If gauze is used, follow your caregiver's directions for changing the  gauze.  To avoid spreading the infection:  Keep your draining abscess covered with a bandage.  Wash your hands well.  Do not share personal care items, towels, or whirlpools with others.  Avoid skin contact with others.  Keep your skin and clothes clean around the abscess.  Keep all follow-up appointments as directed by your caregiver. SEEK MEDICAL CARE IF:   You have increased pain, swelling, redness, fluid drainage, or bleeding.  You have muscle aches, chills, or a general ill feeling.  You have a fever. MAKE SURE YOU:   Understand these instructions.  Will watch your condition.  Will get help right away if you are not doing well or get worse.   This information is not intended to replace advice given to you by your health care provider. Make sure you discuss any questions you have with your health care provider.   Document Released: 02/17/2005 Document Revised: 11/09/2011 Document Reviewed: 07/23/2011 Elsevier Interactive Patient Education [employer].

## 2015-04-28 NOTE — ED Notes (Signed)
Pt reports right groin pain starting last week. Also points to pain in the area between his scrotum and anus. Says he may have injured himself working. No other c/c.

## 2015-04-29 ENCOUNTER — Ambulatory Visit: Payer: BLUE CROSS/BLUE SHIELD | Admitting: Internal Medicine

## 2015-06-10 ENCOUNTER — Ambulatory Visit: Payer: BLUE CROSS/BLUE SHIELD | Admitting: Internal Medicine

## 2015-06-10 ENCOUNTER — Encounter: Payer: Self-pay | Admitting: *Deleted

## 2015-06-10 ENCOUNTER — Telehealth: Payer: Self-pay | Admitting: *Deleted

## 2015-06-10 NOTE — Telephone Encounter (Signed)
Pt has not started Genvoya per [facility].  The pt's insurance has changed after the first of the year.  Phone call to the pt.  Requested that the pt go to [facility] today to give them his new insurance care.  Pt stated that he would.  Dr. Megan Salon informed.

## 2015-06-26 ENCOUNTER — Ambulatory Visit: Payer: BLUE CROSS/BLUE SHIELD | Admitting: Internal Medicine

## 2015-07-01 ENCOUNTER — Ambulatory Visit (INDEPENDENT_AMBULATORY_CARE_PROVIDER_SITE_OTHER): Payer: BLUE CROSS/BLUE SHIELD | Admitting: Internal Medicine

## 2015-07-01 ENCOUNTER — Encounter: Payer: Self-pay | Admitting: Internal Medicine

## 2015-07-01 ENCOUNTER — Ambulatory Visit: Payer: BLUE CROSS/BLUE SHIELD | Admitting: *Deleted

## 2015-07-01 VITALS — BP 154/93 | HR 75 | Temp 98.4°F | Wt 144.8 lb

## 2015-07-01 DIAGNOSIS — Z720 Tobacco use: Secondary | ICD-10-CM | POA: Diagnosis not present

## 2015-07-01 DIAGNOSIS — B20 Human immunodeficiency virus [HIV] disease: Secondary | ICD-10-CM

## 2015-07-01 DIAGNOSIS — F32A Depression, unspecified: Secondary | ICD-10-CM

## 2015-07-01 DIAGNOSIS — Z23 Encounter for immunization: Secondary | ICD-10-CM | POA: Diagnosis not present

## 2015-07-01 DIAGNOSIS — F329 Major depressive disorder, single episode, unspecified: Secondary | ICD-10-CM

## 2015-07-01 DIAGNOSIS — F320 Major depressive disorder, single episode, mild: Secondary | ICD-10-CM

## 2015-07-01 NOTE — Assessment & Plan Note (Signed)
I asked him to set his cell phone alarm to remind him to take his Genvoya each evening. He will follow-up after lab work in 6 weeks.

## 2015-07-01 NOTE — Assessment & Plan Note (Signed)
I reiterated the importance of considering cigarette cessation with him.

## 2015-07-01 NOTE — Assessment & Plan Note (Signed)
He is still suffering from some situational depression caused by his recent HIV diagnosis. I'll arrange for him to meet with Rolena Infante, her behavioral health counselor today. I asked him to call us between visits if he would like to talk about how he is feeling. He will follow-up in 2 months.

## 2015-07-01 NOTE — Progress Notes (Signed)
Patient Active Problem List   Diagnosis Date Noted  . HIV disease ([place]) 02/27/2015    Priority: High  . Depression 04/01/2015  . Routine general medical examination at a health care facility 05/03/2014  . Lipoma of back 05/03/2014  . Tobacco abuse 05/03/2014    Patient's Medications  New Prescriptions   No medications on file  Previous Medications   ASPIRIN-SALICYLAMIDE-CAFFEINE (BC HEADACHE POWDER PO)    Take 1 packet by mouth every 6 (six) hours as needed (pain). Reported on 07/01/2015   ELVITEGRAVIR-COBICISTAT-EMTRICITABINE-TENOFOVIR (GENVOYA) 150-150-200-10 MG TABS TABLET    Take 1 tablet by mouth daily with breakfast.  Modified Medications   No medications on file  Discontinued Medications   BUSPIRONE (BUSPAR) 10 MG TABLET    Take 1 tablet (10 mg total) by mouth 2 (two) times daily as needed.   CLINDAMYCIN (CLEOCIN) 150 MG CAPSULE    Take 2 capsules (300 mg total) by mouth 3 (three) times daily.   DOXYCYCLINE (VIBRA-TABS) 100 MG TABLET    Take 1 tablet (100 mg total) by mouth 2 (two) times daily.   HYDROCODONE-ACETAMINOPHEN (NORCO/VICODIN) 5-325 MG TABLET    Take 1 tablet by mouth every 4 (four) hours as needed.   IBUPROFEN (ADVIL,MOTRIN) 200 MG TABLET    Take 200 mg by mouth every 6 (six) hours as needed for moderate pain.   PHENYLEPH-DOXYLAMINE-DM-APAP (ALKA SELTZER PLUS PO)    Take 1 tablet by mouth daily as needed (cold symptoms).    Subjective: Ronald Castaneda is in for his initial HIV follow-up visit. He just recently got started on his genital area after resolving some insurance issues. He has a co-pay car and does not feel like there be a problem getting his medication going forward. He takes is generally a each evening with dinner. He recalls missing only one dose. This occurred on the day that he did not work and he simply forgot to take it before he fell asleep. He is having no problems tolerating it. He continues to smoke cigarettes. His wife has been tested and is  HIV negative. He states that he does remain a little bit down over his recent diagnosis but states that his wife has been supportive. They have been sexually active but are using condoms. He has been having some problems with erectile dysfunction since his diagnosis.   Review of Systems: Review of Systems  Constitutional: Negative for fever, chills, weight loss, malaise/fatigue and diaphoresis.  HENT: Negative for sore throat.   Respiratory: Negative for cough, sputum production and shortness of breath.   Cardiovascular: Negative for chest pain.  Gastrointestinal: Negative for nausea, vomiting and diarrhea.  Genitourinary: Negative for dysuria.  Musculoskeletal: Negative for joint pain.  Skin: Negative for rash.  Neurological: Negative for sensory change and headaches.  Psychiatric/Behavioral: Positive for depression. Negative for substance abuse. The patient is not nervous/anxious.     Past Medical History  Diagnosis Date  . HIV infection ([place])   . Depression     Social History  Substance Use Topics  . Smoking status: Current Every Day Smoker -- 0.50 packs/day for 44 years    Types: Cigarettes  . Smokeless tobacco: Never Used  . Alcohol Use: 4.2 oz/week    7 Standard drinks or equivalent per week     Comment: weekend drinker    Family History  Problem Relation Age of Onset  . Hypertension Mother     No Known Allergies  Objective:  Filed Vitals:   07/01/15 0910  BP: 154/93  Pulse: 75  Temp: 98.4 F (36.9 C)  TempSrc: Oral  Weight: 144 lb 12 oz (65.658 kg)   Body mass index is 21.69 kg/(m^2).  Physical Exam  Constitutional: He is oriented to person, place, and time. No distress.  HENT:  Mouth/Throat: No oropharyngeal exudate.  Eyes: Conjunctivae are normal.  Cardiovascular: Normal rate and regular rhythm.   No murmur heard. Pulmonary/Chest: Breath sounds normal.  Abdominal: Soft. There is no tenderness.  Musculoskeletal: Normal range of motion.    Neurological: He is alert and oriented to person, place, and time.  Skin: No rash noted.  Psychiatric: Mood and affect normal.    Lab Results Lab Results  Component Value Date   WBC 6.4 03/11/2015   HGB 14.7 03/11/2015   HCT 43.9 03/11/2015   MCV 80.4 03/11/2015   PLT 257 03/11/2015    Lab Results  Component Value Date   CREATININE 0.90 03/11/2015   BUN 14 03/11/2015   NA 142 03/11/2015   K 4.3 03/11/2015   CL 105 03/11/2015   CO2 28 03/11/2015    Lab Results  Component Value Date   ALT 14 03/11/2015   AST 22 03/11/2015   ALKPHOS 107 03/11/2015   BILITOT 0.3 03/11/2015    Lab Results  Component Value Date   CHOL 139 03/11/2015   HDL 30* 03/11/2015   LDLCALC 71 03/11/2015   TRIG 192* 03/11/2015   CHOLHDL 4.6 03/11/2015    Lab Results HIV 1 RNA QUANT (copies/mL)  Date Value  03/11/2015 13932*  02/27/2015 11139*   CD4 T CELL ABS (/uL)  Date Value  03/11/2015 310*      Problem List Items Addressed This Visit      High   HIV disease ([place])    I asked him to set his cell phone alarm to remind him to take his Genvoya each evening. He will follow-up after lab work in 6 weeks.      Relevant Orders   T-helper cell (CD4)- ([facility] clinic only)   HIV 1 RNA quant-no reflex-bld   CBC   Comprehensive metabolic panel   Lipid panel   RPR     Unprioritized   Depression    He is still suffering from some situational depression caused by his recent HIV diagnosis. I'll arrange for him to meet with Rolena Infante, her behavioral health counselor today. I asked him to call us between visits if he would like to talk about how he is feeling. He will follow-up in 2 months.      Tobacco abuse    I reiterated the importance of considering cigarette cessation with him.       Other Visit Diagnoses    Need for prophylactic vaccination against Streptococcus pneumoniae (pneumococcus)    -  Primary         Michel Bickers, MD [facility] [place] [phone] pager   [phone] cell 07/01/2015, 9:34 AM

## 2015-07-01 NOTE — BH Specialist Note (Signed)
Counselor met with Ronald Castaneda today per Dr. Megan Salon request due to observing patient feeling down.  Patient was oriented times four with good affect and dress.  Patient was alert and talkative with counselor.  Patient indicated that at first he was in shock when diagnosed with HIV but says that over time the stinging sensation has been wearing off.  Patient did communicate that he and his wife of two years are having some problems and he fears that his diagnosis may be at the root of the problem.   Counselor provided support and encouragement. Counselor shared with patient that counseling services are available for him with his treatment.  Patient requested a business card and said he would like to set up an appointment to further process his relationship with his wife regarding his status.   Rolena Infante, MA [facility]

## 2015-09-02 ENCOUNTER — Ambulatory Visit: Payer: BLUE CROSS/BLUE SHIELD | Admitting: Internal Medicine

## 2015-09-09 ENCOUNTER — Encounter: Payer: Self-pay | Admitting: Internal Medicine

## 2015-09-09 ENCOUNTER — Telehealth: Payer: Self-pay | Admitting: *Deleted

## 2015-09-09 ENCOUNTER — Ambulatory Visit (INDEPENDENT_AMBULATORY_CARE_PROVIDER_SITE_OTHER): Payer: BLUE CROSS/BLUE SHIELD | Admitting: Internal Medicine

## 2015-09-09 VITALS — BP 161/95 | HR 71 | Temp 98.6°F | Ht 69.5 in | Wt 149.5 lb

## 2015-09-09 DIAGNOSIS — Z720 Tobacco use: Secondary | ICD-10-CM | POA: Diagnosis not present

## 2015-09-09 DIAGNOSIS — B20 Human immunodeficiency virus [HIV] disease: Secondary | ICD-10-CM

## 2015-09-09 DIAGNOSIS — F329 Major depressive disorder, single episode, unspecified: Secondary | ICD-10-CM | POA: Diagnosis not present

## 2015-09-09 DIAGNOSIS — N529 Male erectile dysfunction, unspecified: Secondary | ICD-10-CM | POA: Diagnosis not present

## 2015-09-09 DIAGNOSIS — F32A Depression, unspecified: Secondary | ICD-10-CM

## 2015-09-09 LAB — CBC
HEMATOCRIT: 44.2 % (ref 38.5–50.0)
Hemoglobin: 14.6 g/dL (ref 13.2–17.1)
MCH: 27 pg (ref 27.0–33.0)
MCHC: 33 g/dL (ref 32.0–36.0)
MCV: 81.7 fL (ref 80.0–100.0)
MPV: 10.5 fL (ref 7.5–12.5)
PLATELETS: 244 10*3/uL (ref 140–400)
RBC: 5.41 MIL/uL (ref 4.20–5.80)
RDW: 16.5 % — AB (ref 11.0–15.0)
WBC: 7.3 10*3/uL (ref 3.8–10.8)

## 2015-09-09 LAB — COMPREHENSIVE METABOLIC PANEL
ALT: 16 U/L (ref 9–46)
AST: 25 U/L (ref 10–35)
Albumin: 3.6 g/dL (ref 3.6–5.1)
Alkaline Phosphatase: 95 U/L (ref 40–115)
BUN: 16 mg/dL (ref 7–25)
CALCIUM: 9.1 mg/dL (ref 8.6–10.3)
CHLORIDE: 103 mmol/L (ref 98–110)
CO2: 26 mmol/L (ref 20–31)
Creat: 1.12 mg/dL (ref 0.70–1.33)
GLUCOSE: 80 mg/dL (ref 65–99)
POTASSIUM: 3.9 mmol/L (ref 3.5–5.3)
Sodium: 143 mmol/L (ref 135–146)
TOTAL PROTEIN: 7.3 g/dL (ref 6.1–8.1)
Total Bilirubin: 0.3 mg/dL (ref 0.2–1.2)

## 2015-09-09 MED ORDER — SILDENAFIL CITRATE 20 MG PO TABS: 20.0000 mg | ORAL_TABLET | ORAL | Status: DC | PRN

## 2015-09-09 NOTE — Assessment & Plan Note (Signed)
I encouraged him to set a quit date. 

## 2015-09-09 NOTE — Assessment & Plan Note (Signed)
I offered to give him a trial of the sildenafil.

## 2015-09-09 NOTE — Progress Notes (Signed)
Patient Active Problem List   Diagnosis Date Noted  . HIV disease ([place]) 02/27/2015    Priority: High  . Erectile dysfunction 09/09/2015  . Depression 04/01/2015  . Routine general medical examination at a health care facility 05/03/2014  . Lipoma of back 05/03/2014  . Tobacco abuse 05/03/2014    Patient's Medications  New Prescriptions   SILDENAFIL (REVATIO) 20 MG TABLET    Take 1 tablet (20 mg total) by mouth as needed.  Previous Medications   ASPIRIN-SALICYLAMIDE-CAFFEINE (BC HEADACHE POWDER PO)    Take 1 packet by mouth every 6 (six) hours as needed (pain). Reported on 07/01/2015   ELVITEGRAVIR-COBICISTAT-EMTRICITABINE-TENOFOVIR (GENVOYA) 150-150-200-10 MG TABS TABLET    Take 1 tablet by mouth daily with breakfast.  Modified Medications   No medications on file  Discontinued Medications   No medications on file    Subjective: Ronald Castaneda is in for his routine HIV follow-up visit. He started [place] 2 months ago. He is having no problems tolerating it. He takes it each evening when he gets off of work. He takes it with a meal. He states that he has not been depressed. His relationship with his wife is good. He has been having problems with erectile dysfunction since he was diagnosed with HIV. They have been sexually active but always use condoms. His wife is HIV negative. They're trying to quit smoking cigarettes.   Review of Systems: Review of Systems  Constitutional: Negative for fever, chills, weight loss, malaise/fatigue and diaphoresis.  HENT: Negative for sore throat.   Respiratory: Negative for cough, sputum production and shortness of breath.   Cardiovascular: Negative for chest pain.  Gastrointestinal: Negative for nausea, vomiting and diarrhea.  Genitourinary: Negative for dysuria.  Musculoskeletal: Negative for myalgias and joint pain.  Skin: Negative for rash.  Neurological: Negative for dizziness and headaches.  Psychiatric/Behavioral: Negative for  depression and substance abuse. The patient is not nervous/anxious.     Past Medical History  Diagnosis Date  . HIV infection ([place])   . Depression     Social History  Substance Use Topics  . Smoking status: Current Every Day Smoker -- 0.50 packs/day for 44 years    Types: Cigarettes  . Smokeless tobacco: Never Used  . Alcohol Use: 4.2 oz/week    7 Standard drinks or equivalent per week     Comment: weekend drinker    Family History  Problem Relation Age of Onset  . Hypertension Mother     No Known Allergies  Objective:  There were no vitals filed for this visit. There is no weight on file to calculate BMI.  Physical Exam  Constitutional: He is oriented to person, place, and time.  He is smiling and in good spirits.  HENT:  Mouth/Throat: No oropharyngeal exudate.  Eyes: Conjunctivae are normal.  Cardiovascular: Normal rate and regular rhythm.   No murmur heard. Pulmonary/Chest: Breath sounds normal.  Abdominal: Soft. He exhibits no mass. There is no tenderness.  Musculoskeletal: Normal range of motion.  Neurological: He is alert and oriented to person, place, and time.  Skin: No rash noted.  Psychiatric: Mood and affect normal.    Lab Results Lab Results  Component Value Date   WBC 6.4 03/11/2015   HGB 14.7 03/11/2015   HCT 43.9 03/11/2015   MCV 80.4 03/11/2015   PLT 257 03/11/2015    Lab Results  Component Value Date   CREATININE 0.90 03/11/2015   BUN 14 03/11/2015  NA 142 03/11/2015   K 4.3 03/11/2015   CL 105 03/11/2015   CO2 28 03/11/2015    Lab Results  Component Value Date   ALT 14 03/11/2015   AST 22 03/11/2015   ALKPHOS 107 03/11/2015   BILITOT 0.3 03/11/2015    Lab Results  Component Value Date   CHOL 139 03/11/2015   HDL 30* 03/11/2015   LDLCALC 71 03/11/2015   TRIG 192* 03/11/2015   CHOLHDL 4.6 03/11/2015    Lab Results HIV 1 RNA QUANT (copies/mL)  Date Value  03/11/2015 13932*  02/27/2015 11139*   CD4 T CELL ABS (/uL)    Date Value  03/11/2015 310*      Problem List Items Addressed This Visit      High   HIV disease ([place])    He is off to a good start with Genvoya. I will repeat blood work today and have him come back in 3 months. I talked to him about offering his wife PrEP and asked him to call us if she would like to meet with one of our counselors or pharmacist to review options.      Relevant Orders   T-helper cell (CD4)- ([facility] clinic only)   HIV 1 RNA quant-no reflex-bld   CBC   Comprehensive metabolic panel     Unprioritized   Depression    He does not seem depressed.      Erectile dysfunction - Primary    I offered to give him a trial of the sildenafil.      Relevant Medications   sildenafil (REVATIO) 20 MG tablet   Tobacco abuse    I encouraged him to set a quit date.           Michel Bickers, MD [facility] [place] [phone] pager   [phone] cell 09/09/2015, 8:58 AM

## 2015-09-09 NOTE — Assessment & Plan Note (Signed)
He does not seem depressed.

## 2015-09-09 NOTE — Assessment & Plan Note (Signed)
He is off to a good start with Genvoya. I will repeat blood work today and have him come back in 3 months. I talked to him about offering his wife PrEP and asked him to call us if she would like to meet with one of our counselors or pharmacist to review options.

## 2015-09-09 NOTE — Telephone Encounter (Signed)
PA for Sildenafil Citrate completed and faxed for approval.

## 2015-09-10 LAB — HIV-1 RNA QUANT-NO REFLEX-BLD
HIV 1 RNA Quant: <20 copies/mL (ref <20)
HIV-1 RNA Quant, Log: <1.3 Log copies/mL (ref <1.30)

## 2015-09-10 LAB — T-HELPER CELL (CD4) - (RCID CLINIC ONLY)
CD4 % Helper T Cell: 11 % — ABNORMAL LOW (ref 33–55)
CD4 T CELL ABS: 430 /uL (ref 400–2700)

## 2015-09-11 NOTE — Telephone Encounter (Signed)
PA denied - did not meet criteria for rx per insurance.  Letter was sent to the pt by the insurance company too.

## 2015-12-24 ENCOUNTER — Encounter (HOSPITAL_COMMUNITY): Payer: Self-pay | Admitting: Emergency Medicine

## 2015-12-24 ENCOUNTER — Emergency Department (HOSPITAL_COMMUNITY)
Admission: EM | Admit: 2015-12-24 | Discharge: 2015-12-24 | Disposition: A | Discharge status: Home / Self Care | Payer: BLUE CROSS/BLUE SHIELD | Attending: Emergency Medicine | Admitting: Emergency Medicine

## 2015-12-24 DIAGNOSIS — F17210 Nicotine dependence, cigarettes, uncomplicated: Secondary | ICD-10-CM | POA: Insufficient documentation

## 2015-12-24 DIAGNOSIS — Z21 Asymptomatic human immunodeficiency virus [HIV] infection status: Secondary | ICD-10-CM | POA: Diagnosis not present

## 2015-12-24 DIAGNOSIS — Z7982 Long term (current) use of aspirin: Secondary | ICD-10-CM | POA: Diagnosis not present

## 2015-12-24 DIAGNOSIS — L02212 Cutaneous abscess of back [any part, except buttock]: Secondary | ICD-10-CM | POA: Diagnosis not present

## 2015-12-24 DIAGNOSIS — Z79899 Other long term (current) drug therapy: Secondary | ICD-10-CM | POA: Diagnosis not present

## 2015-12-24 DIAGNOSIS — L0291 Cutaneous abscess, unspecified: Secondary | ICD-10-CM

## 2015-12-24 MED ORDER — BACITRACIN ZINC 500 UNIT/GM EX OINT: 1.0000 "application " | TOPICAL_OINTMENT | Freq: Two times a day (BID) | CUTANEOUS | 0 refills | Status: DC

## 2015-12-24 MED ORDER — SULFAMETHOXAZOLE-TRIMETHOPRIM 800-160 MG PO TABS: 1.0000 | ORAL_TABLET | Freq: Two times a day (BID) | ORAL | 0 refills | Status: AC

## 2015-12-24 MED ORDER — OXYCODONE-ACETAMINOPHEN 5-325 MG PO TABS
1.0000 | ORAL_TABLET | Freq: Once | ORAL | Status: AC
  Administered 2015-12-24: 1 via ORAL
  Filled 2015-12-24: qty 1

## 2015-12-24 MED ORDER — BACITRACIN ZINC 500 UNIT/GM EX OINT
TOPICAL_OINTMENT | Freq: Once | CUTANEOUS | Status: AC
  Administered 2015-12-24: 1 via TOPICAL
  Filled 2015-12-24: qty 28.35

## 2015-12-24 MED ORDER — BACITRACIN ZINC 500 UNIT/GM EX OINT
TOPICAL_OINTMENT | CUTANEOUS | Status: AC
  Filled 2015-12-24: qty 0.9

## 2015-12-24 MED ORDER — LIDOCAINE HCL 2 % IJ SOLN
10.0000 mL | Freq: Once | INTRAMUSCULAR | Status: AC
  Administered 2015-12-24: 200 mg via INTRADERMAL
  Filled 2015-12-24: qty 20

## 2015-12-24 NOTE — ED Triage Notes (Signed)
Patient presents for abscess to mid center back x2-3 months. Reports yesterday it began draining. Patient has gauze in place to wound. C/o pain to area. Denies fever, N/V.

## 2015-12-24 NOTE — Discharge Instructions (Signed)
Return to the emergency department if you have a fever that persists greater than 101 or your abscess appears to become infected (growing surrounding redness and warmth).   Abscess An abscess (boil or furuncle) is an infected area that contains a collection of pus.   SYMPTOMS Signs and symptoms of an abscess include pain, tenderness, redness, or hardness. You may feel a moveable soft area under your skin. An abscess can occur anywhere in the body.   TREATMENT  A surgical cut (incision) may be made over your abscess to drain the pus. Gauze may be packed into the space or a drain may be looped through the abscess cavity (pocket). This provides a drain that will allow the cavity to heal from the inside outwards. The abscess may be painful for a few days, but should feel much better if it was drained.  Your abscess, if seen early, may not have localized and may not have been drained. If not, another appointment may be required if it does not get better on its own or with medications.  HOME CARE INSTRUCTIONS  Only take over-the-counter or prescription medicines for pain, discomfort, or fever as directed by your caregiver.  Take your antibiotics as directed if they were prescribed. Finish them even if you start to feel better.  Keep the skin and clothes clean around your abscess.  If the abscess was drained, you will need to use gauze dressing to collect any draining pus. Dressings will typically need to be changed 3 or more times a day.  The infection may spread by skin contact with others. Avoid skin contact as much as possible.  Practice good hygiene. This includes regular hand washing, cover any draining skin lesions, and do not share personal care items.   SEEK MEDICAL CARE IF:  You develop increased pain, swelling, redness, drainage, or bleeding in the wound site.  You develop signs of generalized infection including muscle aches, chills, fever, or a general ill feeling.  You have an oral  temperature above 102 F (38.9 C).  MAKE SURE YOU:  Understand these instructions.  Will watch your condition.  Will get help right away if you are not doing well or get worse.  Document Released: 02/17/2005 Document Revised: 01/20/2011 Document Reviewed: 12/12/2007 [facility] Patient Information 2012 [place].

## 2015-12-24 NOTE — ED Provider Notes (Signed)
[facility] DEPT Provider Note   CSN: [id number] Arrival date & time: 12/24/15  [id number]  First Provider Contact:  First MD Initiated Contact with Patient 12/24/15 2012     By signing my name below, I, Reola Mosher, attest that this documentation has been prepared under the direction and in the presence of [facility], PA-C.  Electronically Signed: Reola Mosher, ED Scribe. 12/24/15. 8:27 PM.  History   Chief Complaint Chief Complaint  Patient presents with  . Abscess   The history is provided by the patient. No language interpreter was used.   HPI Comments: Ronald Castaneda is a 60 y.o. male with a PMHx of HIV who presents to the Emergency Department complaining of two moderate, gradually growing areas of pain and swelling to the mid-center and upper left-side back onset ~3 months ago and ~1 year ago, respectively. Pt reports associated intermittent malodorous, purulent drainage from the area to the lower back x 1 month, worsening yesterday. Pt states pain is worsened with palpation and direct pressure. No OTC medications or home remedies tried PTA. No hx of similar sx. Denies fever, chills, nausea, vomiting, or any other associated symptoms.   Past Medical History:  Diagnosis Date  . Depression   . HIV infection [facility])    Patient Active Problem List   Diagnosis Date Noted  . Erectile dysfunction 09/09/2015  . Depression 04/01/2015  . HIV disease ([place]) 02/27/2015  . Routine general medical examination at a health care facility 05/03/2014  . Lipoma of back 05/03/2014  . Tobacco abuse 05/03/2014   History reviewed. No pertinent surgical history.  Home Medications    Prior to Admission medications   Medication Sig Start Date End Date Taking? Authorizing Provider  Aspirin-Salicylamide-Caffeine (BC HEADACHE POWDER PO) Take 1 packet by mouth every 6 (six) hours as needed (pain). Reported on 07/01/2015    Historical Provider, MD  bacitracin ointment Apply 1 application  topically 2 (two) times daily. 12/24/15   Ozella Almond Ward, PA-C  elvitegravir-cobicistat-emtricitabine-tenofovir (GENVOYA) 150-150-200-10 MG TABS tablet Take 1 tablet by mouth daily with breakfast. 04/01/15   Michel Bickers, MD  sildenafil (REVATIO) 20 MG tablet Take 1 tablet (20 mg total) by mouth as needed. 09/09/15   Michel Bickers, MD  sulfamethoxazole-trimethoprim (BACTRIM DS,SEPTRA DS) 800-160 MG tablet Take 1 tablet by mouth 2 (two) times daily. 12/24/15 12/31/15  Harrison, PA-C   Family History Family History  Problem Relation Age of Onset  . Hypertension Mother    Social History Social History  Substance Use Topics  . Smoking status: Current Every Day Smoker    Packs/day: 0.50    Years: 44.00    Types: Cigarettes  . Smokeless tobacco: Never Used  . Alcohol use 4.2 oz/week    7 Standard drinks or equivalent per week     Comment: weekend drinker   Allergies   Review of patient's allergies indicates no known allergies.  Review of Systems Review of Systems  Constitutional: Negative for chills and fever.  Gastrointestinal: Negative for nausea and vomiting.  Skin: Positive for wound.   Physical Exam Updated Vital Signs BP 161/87 (BP Location: Left Arm)   Pulse 64   Temp 98.6 F (37 C) (Oral)   Resp 14   SpO2 99%   Physical Exam  Constitutional: He is oriented to person, place, and time. He appears well-developed and well-nourished. No distress.  HENT:  Head: Normocephalic and atraumatic.  Cardiovascular: Normal rate, regular rhythm, normal heart sounds and intact distal  pulses.  Exam reveals no gallop and no friction rub.   No murmur heard. Pulmonary/Chest: Effort normal and breath sounds normal. No respiratory distress. He has no wheezes. He has no rales. He exhibits no tenderness.  Abdominal: Soft. Bowel sounds are normal. He exhibits no distension. There is no tenderness.  Musculoskeletal: He exhibits no edema.  Neurological: He is alert and oriented to  person, place, and time.  Skin: Skin is warm.     Mid back: 4x4cm area of pain and swelling w/o surrounding erythema or fluctuance that is consistent w/ an abscess. Site is actively draining purulent-like fluid.  Upper back: 3x3cm area of pain and swelling, w/o surrounding erythema, fluctuance, or drainage consistent w/ an abscess.   Nursing note and vitals reviewed.  ED Treatments / Results  DIAGNOSTIC STUDIES: Oxygen Saturation is 100% on RA, normal by my interpretation.   COORDINATION OF CARE: 8:15 PM-Discussed next steps with pt. Pt verbalized understanding and is agreeable with the plan.   Procedures Procedures  INCISION AND DRAINAGE PROCEDURE NOTE: Patient identification was confirmed and verbal consent was obtained. This procedure was performed by Amie Portland, PA-C at 8:15 PM. Site: mid-back Sterile procedures observed Needle size: 25 gauge  Anesthetic used (type and amt): 45mL 2% Lidocaine w/o Epinephrine  Blade size: 11  Drainage: large amounts of purulent discharge  Complexity: Complex Site anesthetized, incision made over site, wound drained and explore loculations, rinsed with copious amounts of normal saline, wound packed with sterile gauze, covered with dry, sterile dressing. Pt tolerated procedure well without complications.  Instructions for care discussed verbally and pt provided with additional written instructions for homecare and f/u.  INCISION AND DRAINAGE PROCEDURE NOTE: Patient identification was confirmed and verbal consent was obtained. This procedure was performed by Amie Portland, PA-C at 8:44 PM. Site: upper left back Sterile procedures observed Needle size: 25 gauge Anesthetic used (type and amt): 5 mL 2% Lidocaine Blade size: 11 Drainage: large amounts of purulent drainage Complexity: Complex Site anesthetized, incision made over site, wound drained and explored loculations, rinsed with copious amounts of normal saline, wound packed with sterile  gauze, covered with dry, sterile dressing. Pt tolerated procedure well without complications. Instructions for care discussed verbally and pt provided with additional written instructions for homecare and f/u.  Medications Ordered in ED Medications  lidocaine (XYLOCAINE) 2 % (with pres) injection 200 mg (200 mg Intradermal Given 12/24/15 2103)  oxyCODONE-acetaminophen (PERCOCET/ROXICET) 5-325 MG per tablet 1 tablet (1 tablet Oral Given 12/24/15 2103)  bacitracin ointment (1 application Topical Given 12/24/15 2108)   Initial Impression / Assessment and Plan / ED Course  I have reviewed the triage vital signs and the nursing notes.  Pertinent labs & imaging results that were available during my care of the patient were reviewed by me and considered in my medical decision making (see chart for details).  Clinical Course   Ronald Castaneda is a 60 y.o. male who presents to the Emergency Department presenting with abscess requiring incision and drainage. No evidence of surrounding erythema to suggest cellulitis. No crepitance to suggest necrotizing fasciitis. Incision and drainage performed per procedure note. Patient tolerated the procedure well. Rx for Bactrim given. Wound care instructions discussed. Return to ER if concern for spread of infection, increasing pain, fevers, or other concerns. All questions answered.   Final Clinical Impressions(s) / ED Diagnoses   Final diagnoses:  Abscess   New Prescriptions Discharge Medication List as of 12/24/2015  9:09 PM    START taking  these medications   Details  sulfamethoxazole-trimethoprim (BACTRIM DS,SEPTRA DS) 800-160 MG tablet Take 1 tablet by mouth 2 (two) times daily., Starting Wed 12/24/2015, Until Wed 12/31/2015, Print       I personally performed the services described in this documentation, which was scribed in my presence. The recorded information has been reviewed and is accurate.    [facility] Ward, PA-C 12/24/15 2207    Malvin Johns,  MD 12/24/15 [phone]

## 2015-12-25 ENCOUNTER — Encounter: Payer: Self-pay | Admitting: Family

## 2015-12-25 ENCOUNTER — Ambulatory Visit (INDEPENDENT_AMBULATORY_CARE_PROVIDER_SITE_OTHER): Payer: BLUE CROSS/BLUE SHIELD | Admitting: Family

## 2015-12-25 DIAGNOSIS — L02212 Cutaneous abscess of back [any part, except buttock]: Secondary | ICD-10-CM

## 2015-12-25 DIAGNOSIS — L0291 Cutaneous abscess, unspecified: Secondary | ICD-10-CM | POA: Insufficient documentation

## 2015-12-25 MED ORDER — HYDROCODONE-ACETAMINOPHEN 5-325 MG PO TABS: 1.0000 | ORAL_TABLET | Freq: Four times a day (QID) | ORAL | 0 refills | Status: DC | PRN

## 2015-12-25 NOTE — Assessment & Plan Note (Signed)
Smaller abscess appears to be healing well with no complications. There is concern regarding the larger abscess for continued infection and possible need for additional incision and drainage which may require packing. Encouraged to start antibiotics as prescribed. Short dose of hydrocodone-acetaminophen as needed for pain. If no improvement in the next 24-48 hours recommend additional follow. Return precautions provided.

## 2015-12-25 NOTE — Progress Notes (Signed)
Subjective:    Patient ID: Ronald Castaneda, male    DOB: 1955-07-25, 60 y.o.   MRN: [id number]  Chief Complaint  Patient presents with  . cyst on back    I&D at ER last night, pt is still in pain    HPI:  Ronald Castaneda is a 60 y.o. male who  has a past medical history of Depression and HIV infection ([place]). and presents today for an office visit.   Recently evaluated in the emergency department with to moderate, gradually growing areas of pain and swelling to the mid center back and upper left side of his back that started approximately 3 months to a year ago prior to assessment. Physical assessment with a 4 x 4 centimeter area pain and swelling with surrounding erythema consistent with an abscess and purulent drainage. There is a secondary site measuring 3 x 3 cm with surrounding erythema as well with drainage consistent with an abscess. Incision and drainage was performed on both lesions with large amounts of purulent drainage noted. He was given a prescription for Bactrim. All ED records and labs were reviewed in detail.  This is a new problem. Indicates that he continues to experience the associated symptom of pain located in the areas of I&D last evening. Has yet to start taking the antibiotics. Describes that his back is very sensitive and unable to sit back at times. Pain is described as sharp and constant. Severity of the pain is rated a 9/10. Denies any modifying factors or attempted treatment.   No Known Allergies   Current Outpatient Prescriptions on File Prior to Visit  Medication Sig Dispense Refill  . Aspirin-Salicylamide-Caffeine (BC HEADACHE POWDER PO) Take 1 packet by mouth every 6 (six) hours as needed (pain). Reported on 07/01/2015    . bacitracin ointment Apply 1 application topically 2 (two) times daily. 14 g 0  . elvitegravir-cobicistat-emtricitabine-tenofovir (GENVOYA) 150-150-200-10 MG TABS tablet Take 1 tablet by mouth daily with breakfast. 30 tablet 11  . sildenafil  (REVATIO) 20 MG tablet Take 1 tablet (20 mg total) by mouth as needed. 10 tablet 3  . sulfamethoxazole-trimethoprim (BACTRIM DS,SEPTRA DS) 800-160 MG tablet Take 1 tablet by mouth 2 (two) times daily. (Patient not taking: Reported on 12/25/2015) 14 tablet 0   No current facility-administered medications on file prior to visit.     Past Medical History:  Diagnosis Date  . Depression   . HIV infection ([place])                         Review of Systems  Constitutional: Negative for chills and fever.  Skin: Positive for wound.      Objective:    BP (!) 142/92 (BP Location: Left Arm, Patient Position: Sitting, Cuff Size: Normal)   Pulse (!) 57   Temp 98.5 F (36.9 C) (Oral)   Ht 5' 9.5" (1.765 m)   Wt 148 lb (67.1 kg)   SpO2 98%   BMI 21.54 kg/m  Nursing note and vital signs reviewed.  Physical Exam  Constitutional: He is oriented to person, place, and time. He appears well-developed and well-nourished. No distress.  Cardiovascular: Normal rate, regular rhythm, normal heart sounds and intact distal pulses.   Pulmonary/Chest: Effort normal and breath sounds normal.  Neurological: He is alert and oriented to person, place, and time.  Skin: Skin is warm and dry.  3x3 cm abscess appears to be healing well with no evidence of infection or discharge. 4  x 4cm abscess located on the mid back appears refilled with fluid and is very tender to the touch. No drainage is able to be expressed.   Psychiatric: He has a normal mood and affect. His behavior is normal. Judgment and thought content normal.       Assessment & Plan:   Problem List Items Addressed This Visit      Other   Abscess of back    Smaller abscess appears to be healing well with no complications. There is concern regarding the larger abscess for continued infection and possible need for additional incision and drainage which may require packing. Encouraged to start antibiotics as prescribed. Short dose of  hydrocodone-acetaminophen as needed for pain. If no improvement in the next 24-48 hours recommend additional follow. Return precautions provided.       Relevant Medications   HYDROcodone-acetaminophen (NORCO/VICODIN) 5-325 MG tablet    Other Visit Diagnoses   None.      I am having Mr. Caulder start on HYDROcodone-acetaminophen. I am also having him maintain his elvitegravir-cobicistat-emtricitabine-tenofovir, Aspirin-Salicylamide-Caffeine (BC HEADACHE POWDER PO), sildenafil, sulfamethoxazole-trimethoprim, and bacitracin.   Meds ordered this encounter  Medications  . HYDROcodone-acetaminophen (NORCO/VICODIN) 5-325 MG tablet    Sig: Take 1 tablet by mouth every 6 (six) hours as needed for moderate pain.    Dispense:  10 tablet    Refill:  0    Order Specific Question:   Supervising Provider    Answer:   Pricilla Holm A [id number]     Follow-up: Return if symptoms worsen or fail to improve.  Mauricio Po, FNP

## 2015-12-25 NOTE — Patient Instructions (Signed)
Thank you for choosing [facility].  Summary/Instructions:  Please continue to monitor for signs of infection.  It may require additional treatment.  Please take the antibiotic as prescribed.   Your prescription(s) have been submitted to your pharmacy or been printed and provided for you. Please take as directed and contact our office if you believe you are having problem(s) with the medication(s) or have any questions.  If your symptoms worsen or fail to improve, please contact our office for further instruction, or in case of emergency go directly to the emergency room at the closest medical facility.

## 2015-12-25 NOTE — Progress Notes (Signed)
Pre visit review using our clinic review tool, if applicable. No additional management support is needed unless otherwise documented below in the visit note. 

## 2016-01-01 ENCOUNTER — Encounter: Payer: Self-pay | Admitting: Family

## 2016-01-01 ENCOUNTER — Ambulatory Visit (INDEPENDENT_AMBULATORY_CARE_PROVIDER_SITE_OTHER): Payer: BLUE CROSS/BLUE SHIELD | Admitting: Family

## 2016-01-01 DIAGNOSIS — L02212 Cutaneous abscess of back [any part, except buttock]: Secondary | ICD-10-CM | POA: Diagnosis not present

## 2016-01-01 MED ORDER — SULFAMETHOXAZOLE-TRIMETHOPRIM 800-160 MG PO TABS: 1.0000 | ORAL_TABLET | Freq: Two times a day (BID) | ORAL | 0 refills | Status: DC

## 2016-01-01 NOTE — Progress Notes (Signed)
Subjective:    Patient ID: Ronald Castaneda, male    DOB: 11-14-1955, 60 y.o.   MRN: [id number]  Chief Complaint  Patient presents with  . Follow-up    wants the place on his back checked out had an abscess    HPI:  Ronald Castaneda is a 60 y.o. male who  has a past medical history of Depression and HIV infection ([place]). and presents today Follow-up office visit.  Seen in the office approximately one week ago for 2 small abscesses that were drained in the emergency department with the larger abscess with continued swelling and discomfort. He was prescribed Bactrim. Reports taking the medication as prescribed and denies adverse side effects. His symptoms have improved since the last visit. Pain is much improved. No fevers or additional discharge.    No Known Allergies   Current Outpatient Prescriptions on File Prior to Visit  Medication Sig Dispense Refill  . Aspirin-Salicylamide-Caffeine (BC HEADACHE POWDER PO) Take 1 packet by mouth every 6 (six) hours as needed (pain). Reported on 07/01/2015    . bacitracin ointment Apply 1 application topically 2 (two) times daily. 14 g 0  . elvitegravir-cobicistat-emtricitabine-tenofovir (GENVOYA) 150-150-200-10 MG TABS tablet Take 1 tablet by mouth daily with breakfast. 30 tablet 11  . HYDROcodone-acetaminophen (NORCO/VICODIN) 5-325 MG tablet Take 1 tablet by mouth every 6 (six) hours as needed for moderate pain. 10 tablet 0  . sildenafil (REVATIO) 20 MG tablet Take 1 tablet (20 mg total) by mouth as needed. 10 tablet 3   No current facility-administered medications on file prior to visit.      Review of Systems  Constitutional: Negative for chills and fever.  Skin: Positive for wound.      Objective:    BP 132/78 (BP Location: Left Arm, Patient Position: Sitting, Cuff Size: Normal)   Pulse 75   Temp 98.4 F (36.9 C) (Oral)   Resp 16   Ht 5' 9.5" (1.765 m)   Wt 147 lb (66.7 kg)   SpO2 97%   BMI 21.40 kg/m  Nursing note and vital signs  reviewed.  Physical Exam  Constitutional: He is oriented to person, place, and time. He appears well-developed and well-nourished. No distress.  Cardiovascular: Normal rate, regular rhythm, normal heart sounds and intact distal pulses.   Pulmonary/Chest: Effort normal and breath sounds normal.  Neurological: He is alert and oriented to person, place, and time.  Skin: Skin is warm and dry.  Larger abscess decreased in size from previous with some remaining discharge. 2 small open holes are present. There is a small amount of purulent discharge able to be expressed. No evidence of redness or increased temperature.  Psychiatric: He has a normal mood and affect. His behavior is normal. Judgment and thought content normal.       Assessment & Plan:   Problem List Items Addressed This Visit      Other   Abscess of back    Abscess of back with significant improvement. There continues to remain some mild purulent drainage.No indication currently for incision and drainage Continue current dosage of Bactrim. Continue basic wound care. Follow-up in one week or sooner if symptoms worsen.       Other Visit Diagnoses   None.      I am having Mr. Hiott start on sulfamethoxazole-trimethoprim. I am also having him maintain his elvitegravir-cobicistat-emtricitabine-tenofovir, Aspirin-Salicylamide-Caffeine (BC HEADACHE POWDER PO), sildenafil, bacitracin, and HYDROcodone-acetaminophen.   Meds ordered this encounter  Medications  . sulfamethoxazole-trimethoprim (BACTRIM DS,SEPTRA DS)  800-160 MG tablet    Sig: Take 1 tablet by mouth 2 (two) times daily.    Dispense:  14 tablet    Refill:  0    Order Specific Question:   Supervising Provider    Answer:   Pricilla Holm A [id number]     Follow-up: Return in about 1 week (around 01/08/2016), or if symptoms worsen or fail to improve.  Mauricio Po, FNP

## 2016-01-01 NOTE — Assessment & Plan Note (Signed)
Abscess of back with significant improvement. There continues to remain some mild purulent drainage.No indication currently for incision and drainage Continue current dosage of Bactrim. Continue basic wound care. Follow-up in one week or sooner if symptoms worsen.

## 2016-01-01 NOTE — Patient Instructions (Addendum)
Thank you for choosing [facility].  Summary/Instructions:  Continue with basic wound care.  Some drainage may occur.   Overall it looks a lot better.  Continue the antibiotic for the next 7 days.   Your prescription(s) have been submitted to your pharmacy or been printed and provided for you. Please take as directed and contact our office if you believe you are having problem(s) with the medication(s) or have any questions.  If your symptoms worsen or fail to improve, please contact our office for further instruction, or in case of emergency go directly to the emergency room at the closest medical facility.

## 2016-01-27 ENCOUNTER — Ambulatory Visit: Payer: BLUE CROSS/BLUE SHIELD | Admitting: Internal Medicine

## 2016-02-12 ENCOUNTER — Ambulatory Visit: Payer: BLUE CROSS/BLUE SHIELD | Admitting: Internal Medicine

## 2016-03-16 ENCOUNTER — Encounter: Payer: Self-pay | Admitting: Internal Medicine

## 2016-03-16 ENCOUNTER — Ambulatory Visit (INDEPENDENT_AMBULATORY_CARE_PROVIDER_SITE_OTHER): Payer: BLUE CROSS/BLUE SHIELD | Admitting: Internal Medicine

## 2016-03-16 DIAGNOSIS — Z720 Tobacco use: Secondary | ICD-10-CM

## 2016-03-16 DIAGNOSIS — Z23 Encounter for immunization: Secondary | ICD-10-CM

## 2016-03-16 DIAGNOSIS — B20 Human immunodeficiency virus [HIV] disease: Secondary | ICD-10-CM

## 2016-03-16 DIAGNOSIS — F320 Major depressive disorder, single episode, mild: Secondary | ICD-10-CM | POA: Diagnosis not present

## 2016-03-16 NOTE — Addendum Note (Signed)
Addended by: Lorne Skeens D on: 03/16/2016 09:26 AM   Modules accepted: Orders

## 2016-03-16 NOTE — Assessment & Plan Note (Signed)
His infection has come under excellent control since starting antiretroviral therapy last year. He will continue Genvoya and get repeat blood work today. He will follow-up in 6 months

## 2016-03-16 NOTE — Assessment & Plan Note (Signed)
I talked him about the importance of cigarette cessation and encouraged him to think about trying to quit completely within the next year.

## 2016-03-16 NOTE — Progress Notes (Signed)
Patient Active Problem List   Diagnosis Date Noted  . HIV disease ([employer]) 02/27/2015    Priority: High  . Abscess of back 12/25/2015  . Erectile dysfunction 09/09/2015  . Depression 04/01/2015  . Routine general medical examination at a health care facility 05/03/2014  . Lipoma of back 05/03/2014  . Tobacco abuse 05/03/2014    Patient's Medications  New Prescriptions   No medications on file  Previous Medications   ASPIRIN-SALICYLAMIDE-CAFFEINE (BC HEADACHE POWDER PO)    Take 1 packet by mouth every 6 (six) hours as needed (pain). Reported on 07/01/2015   BACITRACIN OINTMENT    Apply 1 application topically 2 (two) times daily.   ELVITEGRAVIR-COBICISTAT-EMTRICITABINE-TENOFOVIR (GENVOYA) 150-150-200-10 MG TABS TABLET    Take 1 tablet by mouth daily with breakfast.   HYDROCODONE-ACETAMINOPHEN (NORCO/VICODIN) 5-325 MG TABLET    Take 1 tablet by mouth every 6 (six) hours as needed for moderate pain.   SILDENAFIL (REVATIO) 20 MG TABLET    Take 1 tablet (20 mg total) by mouth as needed.   SULFAMETHOXAZOLE-TRIMETHOPRIM (BACTRIM DS,SEPTRA DS) 800-160 MG TABLET    Take 1 tablet by mouth 2 (two) times daily.  Modified Medications   No medications on file  Discontinued Medications   No medications on file    Subjective: Ronald Castaneda is in for his routine HIV follow-up visit. He missed 2 days of his Genvoya when his pharmacy was late getting his refill. Other than that he has not missed any doses since his last visit. He continues to smoke cigarettes. He says he has cut down to half pack a day but has not considered setting a quit date and trying to quit completely. He states that he is stressed and slightly depressed over work. He feels like he gets more work assignments than his coworkers. He has talked to his supervisors about this but says that they have not done anything to address his concerns. He is not getting any regular exercise.   Review of Systems: Review of Systems    Constitutional: Negative for chills, diaphoresis, fever, malaise/fatigue and weight loss.  HENT: Negative for sore throat.   Respiratory: Negative for cough, sputum production and shortness of breath.   Cardiovascular: Negative for chest pain.  Gastrointestinal: Negative for abdominal pain, diarrhea, heartburn, nausea and vomiting.  Genitourinary: Negative for dysuria.  Musculoskeletal: Negative for joint pain and myalgias.  Skin: Negative for rash.  Neurological: Negative for dizziness and headaches.  Psychiatric/Behavioral: Positive for depression. Negative for substance abuse. The patient is not nervous/anxious.     Past Medical History:  Diagnosis Date  . Depression   . HIV infection [facility])     Social History  Substance Use Topics  . Smoking status: Current Every Day Smoker    Packs/day: 0.50    Years: 44.00    Types: Cigarettes  . Smokeless tobacco: Never Used     Comment: stressed at work, unable to decrease at this time  . Alcohol use 3.6 oz/week    6 Cans of beer per week     Comment: weekend drinker    Family History  Problem Relation Age of Onset  . Hypertension Mother     No Known Allergies  Objective:  Vitals:   03/16/16 0849 03/16/16 0857  BP: (!) 167/97 (!) 149/94  Pulse: (!) 59 65  Temp: 98.4 F (36.9 C)   TempSrc: Oral   Weight: 149 lb (67.6 kg)   Height: 5' 9.5" (1.765  m)    Body mass index is 21.69 kg/m.  Physical Exam  Constitutional: He is oriented to person, place, and time.  He is in good spirits.  HENT:  Mouth/Throat: No oropharyngeal exudate.  Eyes: Conjunctivae are normal.  Cardiovascular: Normal rate and regular rhythm.   No murmur heard. Pulmonary/Chest: Effort normal and breath sounds normal. He has no wheezes. He has no rales.  Abdominal: Soft. He exhibits no mass. There is no tenderness.  Musculoskeletal: Normal range of motion.  Neurological: He is alert and oriented to person, place, and time.  Skin: No rash noted.   Psychiatric: Mood and affect normal.    Lab Results Lab Results  Component Value Date   WBC 7.3 09/09/2015   HGB 14.6 09/09/2015   HCT 44.2 09/09/2015   MCV 81.7 09/09/2015   PLT 244 09/09/2015    Lab Results  Component Value Date   CREATININE 1.12 09/09/2015   BUN 16 09/09/2015   NA 143 09/09/2015   K 3.9 09/09/2015   CL 103 09/09/2015   CO2 26 09/09/2015    Lab Results  Component Value Date   ALT 16 09/09/2015   AST 25 09/09/2015   ALKPHOS 95 09/09/2015   BILITOT 0.3 09/09/2015    Lab Results  Component Value Date   CHOL 139 03/11/2015   HDL 30 (L) 03/11/2015   LDLCALC 71 03/11/2015   TRIG 192 (H) 03/11/2015   CHOLHDL 4.6 03/11/2015   HIV 1 RNA Quant (copies/mL)  Date Value  09/09/2015 <20  03/11/2015 13,932 (H)  02/27/2015 11,139 (H)   CD4 T Cell Abs (/uL)  Date Value  09/09/2015 430  03/11/2015 310 (L)     Problem List Items Addressed This Visit      High   HIV disease ([place])    His infection has come under excellent control since starting antiretroviral therapy last year. He will continue Genvoya and get repeat blood work today. He will follow-up in 6 months        Unprioritized   Depression    He continues to be stressed and mildly depressed about his work situation. He states that he is able to talk to his wife about this and that helps. I encouraged him to try to start getting some regular exercise.      Tobacco abuse    I talked him about the importance of cigarette cessation and encouraged him to think about trying to quit completely within the next year.       Other Visit Diagnoses   None.       Michel Bickers, MD [facility] [phone] pager   [phone] cell 03/16/2016, 9:17 AM

## 2016-03-16 NOTE — Assessment & Plan Note (Signed)
He continues to be stressed and mildly depressed about his work situation. He states that he is able to talk to his wife about this and that helps. I encouraged him to try to start getting some regular exercise.

## 2016-03-17 LAB — T-HELPER CELL (CD4) - (RCID CLINIC ONLY)
CD4 % Helper T Cell: 14 % — ABNORMAL LOW (ref 33–55)
CD4 T Cell Abs: 310 /uL — ABNORMAL LOW (ref 400–2700)

## 2016-03-18 LAB — HIV-1 RNA QUANT-NO REFLEX-BLD: HIV-1 RNA Quant, Log: <1.3 Log copies/mL (ref <1.30)

## 2016-05-03 ENCOUNTER — Other Ambulatory Visit: Payer: Self-pay | Admitting: Internal Medicine

## 2016-05-03 DIAGNOSIS — B20 Human immunodeficiency virus [HIV] disease: Secondary | ICD-10-CM

## 2016-05-04 ENCOUNTER — Other Ambulatory Visit: Payer: Self-pay | Admitting: *Deleted

## 2016-06-02 ENCOUNTER — Emergency Department (HOSPITAL_COMMUNITY)
Admission: EM | Admit: 2016-06-02 | Discharge: 2016-06-02 | Disposition: A | Discharge status: Home / Self Care | Payer: BLUE CROSS/BLUE SHIELD | Attending: Emergency Medicine | Admitting: Emergency Medicine

## 2016-06-02 ENCOUNTER — Encounter (HOSPITAL_COMMUNITY): Payer: Self-pay | Admitting: *Deleted

## 2016-06-02 ENCOUNTER — Emergency Department (HOSPITAL_COMMUNITY): Payer: BLUE CROSS/BLUE SHIELD

## 2016-06-02 DIAGNOSIS — M542 Cervicalgia: Secondary | ICD-10-CM | POA: Diagnosis not present

## 2016-06-02 DIAGNOSIS — R531 Weakness: Secondary | ICD-10-CM

## 2016-06-02 DIAGNOSIS — L02212 Cutaneous abscess of back [any part, except buttock]: Secondary | ICD-10-CM | POA: Diagnosis not present

## 2016-06-02 DIAGNOSIS — M6281 Muscle weakness (generalized): Secondary | ICD-10-CM | POA: Diagnosis not present

## 2016-06-02 DIAGNOSIS — Z7982 Long term (current) use of aspirin: Secondary | ICD-10-CM | POA: Insufficient documentation

## 2016-06-02 DIAGNOSIS — F17210 Nicotine dependence, cigarettes, uncomplicated: Secondary | ICD-10-CM | POA: Diagnosis not present

## 2016-06-02 DIAGNOSIS — Z79899 Other long term (current) drug therapy: Secondary | ICD-10-CM | POA: Diagnosis not present

## 2016-06-02 DIAGNOSIS — L0291 Cutaneous abscess, unspecified: Secondary | ICD-10-CM

## 2016-06-02 LAB — BASIC METABOLIC PANEL
ANION GAP: 7 (ref 5–15)
BUN: 18 mg/dL (ref 6–20)
CO2: 28 mmol/L (ref 22–32)
Calcium: 9.4 mg/dL (ref 8.9–10.3)
Chloride: 105 mmol/L (ref 101–111)
Creatinine, Ser: 1.04 mg/dL (ref 0.61–1.24)
Glucose, Bld: 109 mg/dL — ABNORMAL HIGH (ref 65–99)
POTASSIUM: 4.2 mmol/L (ref 3.5–5.1)
SODIUM: 140 mmol/L (ref 135–145)

## 2016-06-02 LAB — URINALYSIS, ROUTINE W REFLEX MICROSCOPIC
Bilirubin Urine: NEGATIVE
Glucose, UA: NEGATIVE mg/dL
Hgb urine dipstick: NEGATIVE
KETONES UR: NEGATIVE mg/dL
NITRITE: NEGATIVE
PH: 6 (ref 5.0–8.0)
Protein, ur: NEGATIVE mg/dL
SPECIFIC GRAVITY, URINE: 1.015 (ref 1.005–1.030)

## 2016-06-02 LAB — CBC
HEMATOCRIT: 43.7 % (ref 39.0–52.0)
Hemoglobin: 15.3 g/dL (ref 13.0–17.0)
MCH: 28.8 pg (ref 26.0–34.0)
MCHC: 35 g/dL (ref 30.0–36.0)
MCV: 82.1 fL (ref 78.0–100.0)
Platelets: 248 10*3/uL (ref 150–400)
RBC: 5.32 MIL/uL (ref 4.22–5.81)
RDW: 14.2 % (ref 11.5–15.5)
WBC: 7.9 10*3/uL (ref 4.0–10.5)

## 2016-06-02 LAB — URINALYSIS, MICROSCOPIC (REFLEX): RBC / HPF: NONE SEEN RBC/hpf (ref 0–5)

## 2016-06-02 MED ORDER — IOPAMIDOL (ISOVUE-370) INJECTION 76%
INTRAVENOUS | Status: AC
  Administered 2016-06-02: 50 mL
  Filled 2016-06-02: qty 50

## 2016-06-02 MED ORDER — LIDOCAINE HCL (PF) 1 % IJ SOLN
5.0000 mL | Freq: Once | INTRAMUSCULAR | Status: AC
Start: 2016-06-02 — End: 2016-06-02
  Administered 2016-06-02: 5 mL
  Filled 2016-06-02: qty 5

## 2016-06-02 MED ORDER — CEPHALEXIN 500 MG PO CAPS: 500.0000 mg | ORAL_CAPSULE | Freq: Two times a day (BID) | ORAL | 0 refills | Status: DC

## 2016-06-02 NOTE — ED Triage Notes (Signed)
Pt reports episodes of arm weakness, first started yesterday. Pt reports both arms feel heavy and weak and during episode pt is unable to move them. Today is having neck pain. Pt able to lift arms and +bilateral grip noted at triage.

## 2016-06-02 NOTE — ED Notes (Signed)
Provided patient with turkey sandwich and sprite. 

## 2016-06-02 NOTE — ED Notes (Signed)
Pt had episode of "arm numbness and tingling" started yesterday--went away, developed pain in neck today with numbness in both arms today-- no injury, has been feeling "weak" yesterday-- has a cough, no fever.  Also has a cyst on back that wife has been squeezing yellow liquid out -- mid thoracic area.

## 2016-06-02 NOTE — ED Notes (Signed)
PA-C at bedside 

## 2016-06-02 NOTE — ED Notes (Signed)
Patient verbalized understanding of discharge instructions and denies any further needs or questions at this time. VS stable. Patient ambulatory with steady gait.  

## 2016-06-02 NOTE — ED Provider Notes (Signed)
[place] DEPT Provider Note   CSN: [id number] Arrival date & time: 06/02/16  [id number]     History   Chief Complaint Chief Complaint  Patient presents with  . Neck Pain  . Extremity Weakness    HPI Ronald Castaneda is a 61 y.o. male.  Ronald Castaneda is a 61 y.o. male with h/o HIV, depression, ED, tobacco abuse presents to ED with complaint of b/l arm weakness. Patient reports yesterday while walking at work he experienced sudden onset of b/l arm weakness with associated lightheadedness that lasted for approximately 10-15 minutes and resolved on its own. At work today he experienced sudden onset of sharp pain in his neck with radiation into b/l upper extremities with associated weakness described as "arms felt like lead" prompting his visit today to the ED. He denies fever, chills, diaphoresis, trouble swallowing, visual disturbance, shortness of breath, chest pain, abdominal pain, nausea, vomiting, dysuria, hematuria, loss of bowel or bladder control, saddle anesthesia, numbness, facial droop, slurred speech, headache, IVDU, unexpected weight loss, cancer, or trauma. H/o HIV, on anti-retroviral therapy and followed by ID. Last CD4 counts 310 in Oct 2017. Of incidental note, pt complains of "cyst" on his back draining malodorous yellow drainage.       Past Medical History:  Diagnosis Date  . Depression   . HIV infection [facility])     Patient Active Problem List   Diagnosis Date Noted  . Abscess of back 12/25/2015  . Erectile dysfunction 09/09/2015  . Depression 04/01/2015  . HIV disease ([place]) 02/27/2015  . Routine general medical examination at a health care facility 05/03/2014  . Lipoma of back 05/03/2014  . Tobacco abuse 05/03/2014    History reviewed. No pertinent surgical history.     Home Medications    Prior to Admission medications   Medication Sig Start Date End Date Taking? Authorizing Provider  Aspirin-Salicylamide-Caffeine (BC HEADACHE POWDER PO) Take 1 packet by  mouth every 6 (six) hours as needed (pain). Reported on 07/01/2015   Yes Historical Provider, MD  GENVOYA 150-150-200-10 MG TABS tablet TAKE ONE TABLET BY MOUTH ONCE DAILY WITH BREAKFAST 05/04/16  Yes Michel Bickers, MD  Multiple Vitamin (MULTIVITAMIN WITH MINERALS) TABS tablet Take 1 tablet by mouth daily.   Yes Historical Provider, MD  cephALEXin (KEFLEX) 500 MG capsule Take 1 capsule (500 mg total) by mouth 2 (two) times daily. 06/02/16   Roxanna Mew, PA-C  HYDROcodone-acetaminophen (NORCO/VICODIN) 5-325 MG tablet Take 1 tablet by mouth every 6 (six) hours as needed for moderate pain. Patient not taking: Reported on 06/02/2016 12/25/15   [place], FNP  sildenafil (REVATIO) 20 MG tablet Take 1 tablet (20 mg total) by mouth as needed. Patient not taking: Reported on 06/02/2016 09/09/15   Michel Bickers, MD    Family History Family History  Problem Relation Age of Onset  . Hypertension Mother     Social History Social History  Substance Use Topics  . Smoking status: Current Every Day Smoker    Packs/day: 0.50    Years: 44.00    Types: Cigarettes  . Smokeless tobacco: Never Used     Comment: stressed at work, unable to decrease at this time  . Alcohol use 3.6 oz/week    6 Cans of beer per week     Comment: weekend drinker     Allergies   Patient has no known allergies.   Review of Systems Review of Systems  Constitutional: Negative for chills, diaphoresis and fever.  HENT: Negative for  trouble swallowing.   Eyes: Negative for visual disturbance.  Respiratory: Negative for cough and shortness of breath.   Cardiovascular: Negative for chest pain.  Gastrointestinal: Negative for abdominal pain, diarrhea, nausea and vomiting.  Genitourinary: Negative for dysuria and hematuria.  Musculoskeletal: Negative for arthralgias and myalgias.  Skin: Positive for wound ( head and back).  Allergic/Immunologic: Positive for immunocompromised state.  Neurological: Positive for  weakness and light-headedness ( during initial episode yesterday, none today). Negative for dizziness, facial asymmetry, speech difficulty, numbness and headaches.     Physical Exam Updated Vital Signs BP 146/75 (BP Location: Right Arm)   Pulse 62   Temp 98.1 F (36.7 C) (Oral)   Resp 18   SpO2 100%   Physical Exam  Constitutional: He appears well-developed and well-nourished. No distress.  HENT:  Head: Normocephalic and atraumatic.  Mouth/Throat: Oropharynx is clear and moist. No oropharyngeal exudate.  Eyes: Conjunctivae and EOM are normal. Pupils are equal, round, and reactive to light. Right eye exhibits no discharge. Left eye exhibits no discharge. No scleral icterus.  Neck: Normal range of motion and phonation normal. Neck supple. No neck rigidity. Normal range of motion present.  Cardiovascular: Normal rate, regular rhythm, normal heart sounds and intact distal pulses.   No murmur heard. Pulmonary/Chest: Effort normal and breath sounds normal. No stridor. No respiratory distress. He has no wheezes. He has no rales.  Abdominal: Soft. Bowel sounds are normal. He exhibits no distension. There is no tenderness. There is no rigidity, no rebound, no guarding and no CVA tenderness.  Musculoskeletal: Normal range of motion.  Lymphadenopathy:    He has no cervical adenopathy.  Neurological: He is alert. He is not disoriented. Coordination and gait normal. GCS eye subscore is 4. GCS verbal subscore is 5. GCS motor subscore is 6.  Mental Status:  Alert, thought content appropriate, able to give a coherent history. Speech fluent without evidence of aphasia. Able to follow 2 step commands without difficulty.  Cranial Nerves:  II:  Peripheral visual fields grossly normal, pupils equal, round, reactive to light III,IV, VI: ptosis not present, extra-ocular motions intact bilaterally  V,VII: smile symmetric, facial light touch sensation equal VIII: hearing grossly normal to voice  X: uvula  elevates symmetrically  XI: bilateral shoulder shrug symmetric and strong XII: midline tongue extension without fassiculations Motor:  Normal tone. 5/5 in upper and lower extremities bilaterally including strong and equal grip strength and dorsiflexion/plantar flexion Sensory: light touch normal in all extremities. DTRs: biceps and patellar 2+ symmetric b/l Cerebellar: normal finger-to-nose with bilateral upper extremities Gait: normal gait and balance CV: distal pulses palpable throughout    Skin: Skin is warm and dry. He is not diaphoretic.     Psychiatric: He has a normal mood and affect. His behavior is normal.     ED Treatments / Results  Labs (all labs ordered are listed, but only abnormal results are displayed) Labs Reviewed  BASIC METABOLIC PANEL - Abnormal; Notable for the following:       Result Value   Glucose, Bld 109 (*)    All other components within normal limits  URINALYSIS, ROUTINE W REFLEX MICROSCOPIC - Abnormal; Notable for the following:    Leukocytes, UA TRACE (*)    All other components within normal limits  URINALYSIS, MICROSCOPIC (REFLEX) - Abnormal; Notable for the following:    Bacteria, UA RARE (*)    Squamous Epithelial / LPF 0-5 (*)    All other components within normal limits  CBC  EKG  EKG Interpretation  Date/Time:  Wednesday June 02 2016 10:21:47 EST Ventricular Rate:  65 PR Interval:  118 QRS Duration: 92 QT Interval:  380 QTC Calculation: 395 R Axis:   92 Text Interpretation:  Normal sinus rhythm Rightward axis Borderline ECG no prior EKG  Confirmed by LIU MD, DANA [id number]) on 06/02/2016 1:05:20 PM       Radiology No results found.  Procedures .Marland KitchenIncision and Drainage Date/Time: 06/05/2016 6:39 AM Performed by: Roxanna Mew Authorized by: Roxanna Mew   Consent:    Consent obtained:  Verbal   Consent given by:  Patient   Risks discussed:  Incomplete drainage and infection   Alternatives discussed:  No  treatment Location:    Type:  Abscess   Location:  Trunk   Trunk location:  Back Pre-procedure details:    Skin preparation:  Betadine Anesthesia (see MAR for exact dosages):    Anesthesia method:  Topical application   Topical anesthesia: Pain ease. Procedure type:    Complexity:  Simple Procedure details:    Needle aspiration: no     Incision types:  Single straight   Incision depth:  Dermal   Scalpel blade:  11   Wound management:  Irrigated with saline   Drainage:  Purulent and bloody   Drainage amount: mild.   Wound treatment:  Wound left open   Packing materials:  None Post-procedure details:    Patient tolerance of procedure:  Tolerated well, no immediate complications   (including critical care time)  EMERGENCY DEPARTMENT US SOFT TISSUE INTERPRETATION "Study: Limited Ultrasound of the noted body part in comments below"  INDICATIONS: Soft tissue infection Multiple views of the body part are obtained with a multi-frequency linear probe  PERFORMED BY:  Myself  IMAGES ARCHIVED?: Yes  SIDE:Midline  BODY PART:Lower back  FINDINGS: Abcess present  LIMITATIONS:  none  INTERPRETATION:  Abcess present  COMMENT:  none    Medications Ordered in ED Medications  iopamidol (ISOVUE-370) 76 % injection (50 mLs  Contrast Given 06/02/16 1431)  lidocaine (PF) (XYLOCAINE) 1 % injection 5 mL (5 mLs Infiltration Given by Other 06/02/16 1541)     Initial Impression / Assessment and Plan / ED Course  I have reviewed the triage vital signs and the nursing notes.  Pertinent labs & imaging results that were available during my care of the patient were reviewed by me and considered in my medical decision making (see chart for details).  Clinical Course as of Jun 05 638  Wed Jun 02, 2016  1518 CT Angio Head W/Cm &/Or Wo Cm [AM]  1518 CT Angio Neck W and/or Wo Contrast [AM]    Clinical Course User Index [AM] Roxanna Mew, PA-C   Patient presents to ED with  complaint of 2 episodes of intermittent b/l upper extremity weakness. During second episode today patient experienced neck pain. Pt currently asymptomatic. h/o HIV on anti-retroviral, CD 4 counts in 02/2016 were 310. Patient is afebrile and non-toxic appearing in NAD. VSS. Heart RRR. Lungs CTABL. Abdomen soft and non-tender. No focal neuro deficits on exam. CBC and BMP grossly unremarkable. U/A shows trace leukocytes, asx. Given sxs will consult neurology regarding if imaging is indicated at this time.   1:49 PM: Spoke with Neurology, greatly appreciate their time and input. Recommend CTA head and neck to r/o dissection. If clean OP MRI cervical spine and follow up with neurology.   CTA head did not show any acute intracranial abnormality; no evidence of  occlusion, stenosis, aneurysm, or malformation at circle of willis; chronic ischemic microvascular changes. CTA neck did not show any evidence of dissection, occlusion, aneurysm, or significant stenosis of carotid or vertebral arteries. Of incidental finding is a hypoattenuating lesion in left parotid gland - ?cyst vs. Neoplasm - discussed these findings with the patient and recommend OP follow up with PCP for further evaluation. Of other note, soft tissue swelling of dermis and subcutaneous at suboccipital region - ?cellulitis vs. Hidradinitis. On physical exam patient does appear to have mild swelling to occipital region of head, no obvious fluctuance. Will initiate ABX.   Patient also states he has a "draining cyst" on his lower back. On evaluation patient has abscess amenable to incision and drainage.  Abscess was not large enough to warrant packing or drain,  wound recheck in 2 days. Encouraged home warm soaks and flushing. Given h/o HIV, ABX initiated. Return precautions given.   Discussed CTA findings and recommendations by Neurology. OP cervical MRI ordered. Referral information to Neurology provided. Follow up with PCP for wound check of abscess and  further evaluation of parotid gland CT findings. Return precautions given. Pt voiced understanding and is agreeable.   Final Clinical Impressions(s) / ED Diagnoses   Final diagnoses:  Neck pain  Weakness  Abscess    New Prescriptions Discharge Medication List as of 06/02/2016  4:53 PM    START taking these medications   Details  cephALEXin (KEFLEX) 500 MG capsule Take 1 capsule (500 mg total) by mouth 2 (two) times daily., Starting Wed 06/02/2016, [place], [place] 06/05/16 [place], PA-C 06/05/16 [place] Liu, MD 06/05/16 [phone]

## 2016-06-02 NOTE — Discharge Instructions (Signed)
Read the information below.  Your CT scan of your head and neck did not show any dissection or aneurysm.  Please call the contact information above for Neurology to schedule a follow up appointment. I have also scheduled an outpatient MRI of your cervical spine.  There is a lesion noted in your left parotid gland. Please call your primary provider and schedule a follow up for further evaluation and management.  You are being started on antibiotics for abscess. Please take as directed. Perform warm soaks daily. Change dressing daily. Please follow up with your primary doctor in 2-3 days for re-evaluation of wound.  Use the prescribed medication as directed.  Please discuss all new medications with your pharmacist.   You may return to the Emergency Department at any time for worsening condition or any new symptoms that concern you. Return if develop fever, chest pain, shortness of breath, new neurologic symptoms, or any other new/concerning symptoms.

## 2016-06-17 ENCOUNTER — Encounter: Payer: Self-pay | Admitting: Internal Medicine

## 2016-06-17 ENCOUNTER — Ambulatory Visit (INDEPENDENT_AMBULATORY_CARE_PROVIDER_SITE_OTHER): Payer: BLUE CROSS/BLUE SHIELD | Admitting: Internal Medicine

## 2016-06-17 VITALS — BP 140/86 | HR 63 | Temp 98.5°F | Resp 12 | Ht 69.5 in | Wt 156.0 lb

## 2016-06-17 DIAGNOSIS — Z0000 Encounter for general adult medical examination without abnormal findings: Secondary | ICD-10-CM | POA: Diagnosis not present

## 2016-06-17 DIAGNOSIS — K118 Other diseases of salivary glands: Secondary | ICD-10-CM | POA: Diagnosis not present

## 2016-06-17 DIAGNOSIS — M5010 Cervical disc disorder with radiculopathy, unspecified cervical region: Secondary | ICD-10-CM | POA: Diagnosis not present

## 2016-06-17 DIAGNOSIS — Z720 Tobacco use: Secondary | ICD-10-CM | POA: Diagnosis not present

## 2016-06-17 MED ORDER — CYCLOBENZAPRINE HCL 5 MG PO TABS: 5.0000 mg | ORAL_TABLET | Freq: Three times a day (TID) | ORAL | 1 refills | Status: DC | PRN

## 2016-06-17 NOTE — Patient Instructions (Addendum)
We have put in the order for the scan of the neck and you will get a call about scheduling.   We do not need blood work today.   We have sent in flexeril for the pain in the neck and arms that you can take as needed up to 3 times per day.   We will try to move up the neurology visit.   Health Maintenance, Male A healthy lifestyle and preventative care can promote health and wellness.  Maintain regular health, dental, and eye exams.  Eat a healthy diet. Foods like vegetables, fruits, whole grains, low-fat dairy products, and lean protein foods contain the nutrients you need and are low in calories. Decrease your intake of foods high in solid fats, added sugars, and salt. Get information about a proper diet from your health care provider, if necessary.  Regular physical exercise is one of the most important things you can do for your health. Most adults should get at least 150 minutes of moderate-intensity exercise (any activity that increases your heart rate and causes you to sweat) each week. In addition, most adults need muscle-strengthening exercises on 2 or more days a week.   Maintain a healthy weight. The body mass index (BMI) is a screening tool to identify possible weight problems. It provides an estimate of body fat based on height and weight. Your health care provider can find your BMI and can help you achieve or maintain a healthy weight. For males 20 years and older:  A BMI below 18.5 is considered underweight.  A BMI of 18.5 to 24.9 is normal.  A BMI of 25 to 29.9 is considered overweight.  A BMI of 30 and above is considered obese.  Maintain normal blood lipids and cholesterol by exercising and minimizing your intake of saturated fat. Eat a balanced diet with plenty of fruits and vegetables. Blood tests for lipids and cholesterol should begin at age 22 and be repeated every 5 years. If your lipid or cholesterol levels are high, you are over age 71, or you are at high risk for  heart disease, you may need your cholesterol levels checked more frequently.Ongoing high lipid and cholesterol levels should be treated with medicines if diet and exercise are not working.  If you smoke, find out from your health care provider how to quit. If you do not use tobacco, do not start.  Lung cancer screening is recommended for adults aged 74-80 years who are at high risk for developing lung cancer because of a history of smoking. A yearly low-dose CT scan of the lungs is recommended for people who have at least a 30-pack-year history of smoking and are current smokers or have quit within the past 15 years. A pack year of smoking is smoking an average of 1 pack of cigarettes a day for 1 year (for example, a 30-pack-year history of smoking could mean smoking 1 pack a day for 30 years or 2 packs a day for 15 years). Yearly screening should continue until the smoker has stopped smoking for at least 15 years. Yearly screening should be stopped for people who develop a health problem that would prevent them from having lung cancer treatment.  If you choose to drink alcohol, do not have more than 2 drinks per day. One drink is considered to be 12 oz (360 mL) of beer, 5 oz (150 mL) of wine, or 1.5 oz (45 mL) of liquor.  Avoid the use of street drugs. Do not share needles  with anyone. Ask for help if you need support or instructions about stopping the use of drugs.  High blood pressure causes heart disease and increases the risk of stroke. High blood pressure is more likely to develop in:  People who have blood pressure in the end of the normal range (100-139/85-89 mm Hg).  People who are overweight or obese.  People who are African American.  If you are 63-34 years of age, have your blood pressure checked every 3-5 years. If you are 31 years of age or older, have your blood pressure checked every year. You should have your blood pressure measured twice-once when you are at a hospital or clinic,  and once when you are not at a hospital or clinic. Record the average of the two measurements. To check your blood pressure when you are not at a hospital or clinic, you can use:  An automated blood pressure machine at a pharmacy.  A home blood pressure monitor.  If you are 87-49 years old, ask your health care provider if you should take aspirin to prevent heart disease.  Diabetes screening involves taking a blood sample to check your fasting blood sugar level. This should be done once every 3 years after age 34 if you are at a normal weight and without risk factors for diabetes. Testing should be considered at a younger age or be carried out more frequently if you are overweight and have at least 1 risk factor for diabetes.  Colorectal cancer can be detected and often prevented. Most routine colorectal cancer screening begins at the age of 71 and continues through age 49. However, your health care provider may recommend screening at an earlier age if you have risk factors for colon cancer. On a yearly basis, your health care provider may provide home test kits to check for hidden blood in the stool. A small camera at the end of a tube may be used to directly examine the colon (sigmoidoscopy or colonoscopy) to detect the earliest forms of colorectal cancer. Talk to your health care provider about this at age 18 when routine screening begins. A direct exam of the colon should be repeated every 5-10 years through age 73, unless early forms of precancerous polyps or small growths are found.  People who are at an increased risk for hepatitis B should be screened for this virus. You are considered at high risk for hepatitis B if:  You were born in a country where hepatitis B occurs often. Talk with your health care provider about which countries are considered high risk.  Your parents were born in a high-risk country and you have not received a shot to protect against hepatitis B (hepatitis B  vaccine).  You have HIV or AIDS.  You use needles to inject street drugs.  You live with, or have sex with, someone who has hepatitis B.  You are a man who has sex with other men (MSM).  You get hemodialysis treatment.  You take certain medicines for conditions like cancer, organ transplantation, and autoimmune conditions.  Hepatitis C blood testing is recommended for all people born from [birth year] through 1965 and any individual with known risk factors for hepatitis C.  Healthy men should no longer receive prostate-specific antigen (PSA) blood tests as part of routine cancer screening. Talk to your health care provider about prostate cancer screening.  Testicular cancer screening is not recommended for adolescents or adult males who have no symptoms. Screening includes self-exam, a health care provider  exam, and other screening tests. Consult with your health care provider about any symptoms you have or any concerns you have about testicular cancer.  Practice safe sex. Use condoms and avoid high-risk sexual practices to reduce the spread of sexually transmitted infections (STIs).  You should be screened for STIs, including gonorrhea and chlamydia if:  You are sexually active and are younger than 24 years.  You are older than 24 years, and your health care provider tells you that you are at risk for this type of infection.  Your sexual activity has changed since you were last screened, and you are at an increased risk for chlamydia or gonorrhea. Ask your health care provider if you are at risk.  If you are at risk of being infected with HIV, it is recommended that you take a prescription medicine daily to prevent HIV infection. This is called pre-exposure prophylaxis (PrEP). You are considered at risk if:  You are a man who has sex with other men (MSM).  You are a heterosexual man who is sexually active with multiple partners.  You take drugs by injection.  You are sexually active  with a partner who has HIV.  Talk with your health care provider about whether you are at high risk of being infected with HIV. If you choose to begin PrEP, you should first be tested for HIV. You should then be tested every 3 months for as long as you are taking PrEP.  Use sunscreen. Apply sunscreen liberally and repeatedly throughout the day. You should seek shade when your shadow is shorter than you. Protect yourself by wearing long sleeves, pants, a wide-brimmed hat, and sunglasses year round whenever you are outdoors.  Tell your health care provider of new moles or changes in moles, especially if there is a change in shape or color. Also, tell your health care provider if a mole is larger than the size of a pencil eraser.  A one-time screening for abdominal aortic aneurysm (AAA) and surgical repair of large AAAs by ultrasound is recommended for men aged 35-75 years who are current or former smokers.  Stay current with your vaccines (immunizations). This information is not intended to replace advice given to you by your health care provider. Make sure you discuss any questions you have with your health care provider. Document Released: 11/06/2007 Document Revised: 05/31/2014 Document Reviewed: 02/11/2015 Elsevier Interactive Patient Education  2017 [employer].

## 2016-06-17 NOTE — Assessment & Plan Note (Signed)
Abnormality of CT angio neck with lesion in the parotid gland which they are unable to characterize. With his history of hidradenitis he could have cyst but need to rule out malignancy with his smoking history. Ordered CT soft tissue with contrast. Recent BMP okay for contrast.

## 2016-06-17 NOTE — Assessment & Plan Note (Signed)
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 Rx for flexeril for pain and will see if we can move up his neurology visit.

## 2016-06-17 NOTE — Progress Notes (Signed)
   Subjective:    Patient ID: Ronald Castaneda, male    DOB: 08/08/1955, 61 y.o.   MRN: [id number]  HPI  The patient is a 61 YO male coming in for wellness but he is having a couple concerns which we address as well.   PMH, [facility], social history reviewed and updated.   Review of Systems  Constitutional: Positive for activity change. Negative for appetite change, chills, fatigue, fever and unexpected weight change.  HENT: Negative.   Eyes: Negative.   Respiratory: Negative for cough, chest tightness, shortness of breath and wheezing.   Cardiovascular: Negative for chest pain, palpitations and leg swelling.  Gastrointestinal: Negative for abdominal distention, abdominal pain, constipation, diarrhea, nausea and vomiting.  Musculoskeletal: Positive for arthralgias, neck pain and neck stiffness. Negative for back pain and gait problem.  Skin: Negative.   Neurological: Positive for numbness. Negative for dizziness, tremors, facial asymmetry and weakness.  Psychiatric/Behavioral: Negative.       Objective:   Physical Exam  Constitutional: He is oriented to person, place, and time. He appears well-developed and well-nourished.  HENT:  Head: Normocephalic and atraumatic.  Right Ear: External ear normal.  Left Ear: External ear normal.  Left parotid gland area with some swelling, no palpable gland but some hard soft tissue from past hidradenitis changes  Eyes: EOM are normal.  Neck: Neck supple. No JVD present.  Some limitation of ROM, mild, pain over the cervical spine and some pain in the muscles in the shoulders and scapula region  Cardiovascular: Normal rate and regular rhythm.   Pulmonary/Chest: Effort normal and breath sounds normal. No respiratory distress. He has no wheezes. He has no rales.  Abdominal: Soft. Bowel sounds are normal. He exhibits no distension. There is no tenderness. There is no rebound.  Musculoskeletal: He exhibits no edema.  Lymphadenopathy:    He has no cervical  adenopathy.  Neurological: He is alert and oriented to person, place, and time. Coordination normal.  Skin: Skin is warm and dry.  Psychiatric: He has a normal mood and affect.   Vitals:   06/17/16 0821  BP: 140/86  Pulse: 63  Resp: 12  Temp: 98.5 F (36.9 C)  TempSrc: Oral  SpO2: 100%  Weight: 156 lb (70.8 kg)  Height: 5' 9.5" (1.765 m)      Assessment & Plan:

## 2016-06-17 NOTE — Assessment & Plan Note (Signed)
Declines tetanus shot and colonoscopy. He did get flu shot this season. Counseled on the need to stop smoking and he will make an effort. Declines shingles as well. Given screening recommendations at the visit.

## 2016-06-17 NOTE — Progress Notes (Signed)
Pre visit review using our clinic review tool, if applicable. No additional management support is needed unless otherwise documented below in the visit note. 

## 2016-06-17 NOTE — Assessment & Plan Note (Signed)
He does want to quit and will make an effort.

## 2016-07-05 ENCOUNTER — Other Ambulatory Visit: Payer: Self-pay | Admitting: Internal Medicine

## 2016-07-05 ENCOUNTER — Ambulatory Visit
Admission: RE | Admit: 2016-07-05 | Discharge: 2016-07-05 | Disposition: A | Discharge status: Home / Self Care | Payer: BLUE CROSS/BLUE SHIELD | Source: Ambulatory Visit | Attending: Internal Medicine | Admitting: Internal Medicine

## 2016-07-05 DIAGNOSIS — K118 Other diseases of salivary glands: Secondary | ICD-10-CM

## 2016-07-05 MED ORDER — IOPAMIDOL (ISOVUE-300) INJECTION 61%
75.0000 mL | Freq: Once | INTRAVENOUS | Status: AC | PRN
  Administered 2016-07-05: 75 mL via INTRAVENOUS

## 2016-07-16 ENCOUNTER — Telehealth: Payer: Self-pay | Admitting: Internal Medicine

## 2016-07-16 ENCOUNTER — Encounter: Payer: Self-pay | Admitting: Internal Medicine

## 2016-07-16 NOTE — Telephone Encounter (Signed)
Please see result note from CT. The cyst was seen on the CT and they could not determine for sure if this was all liquid or some solid component and felt he needed a biopsy to be sure. He should call the ear, nose and throat doctor to get this biopsy done as they have been trying to call him about this [phone])

## 2016-07-16 NOTE — Telephone Encounter (Signed)
Patient contacted and stated awareness and has apt with ent

## 2016-07-16 NOTE — Telephone Encounter (Signed)
Pt would like someone to call him back about ct results.

## 2016-07-18 ENCOUNTER — Encounter (HOSPITAL_COMMUNITY): Payer: Self-pay | Admitting: *Deleted

## 2016-07-18 ENCOUNTER — Ambulatory Visit (HOSPITAL_COMMUNITY)
Admission: EM | Admit: 2016-07-18 | Discharge: 2016-07-18 | Disposition: A | Discharge status: Home / Self Care | Payer: BLUE CROSS/BLUE SHIELD | Attending: Family Medicine | Admitting: Family Medicine

## 2016-07-18 DIAGNOSIS — L02215 Cutaneous abscess of perineum: Secondary | ICD-10-CM

## 2016-07-18 LAB — POCT URINALYSIS DIP (DEVICE)
Bilirubin Urine: NEGATIVE
GLUCOSE, UA: NEGATIVE mg/dL
Ketones, ur: NEGATIVE mg/dL
NITRITE: NEGATIVE
PROTEIN: 100 mg/dL — AB
Specific Gravity, Urine: >=1.03 (ref 1.005–1.030)
UROBILINOGEN UA: 0.2 mg/dL (ref 0.0–1.0)
pH: 6 (ref 5.0–8.0)

## 2016-07-18 MED ORDER — CEFTRIAXONE SODIUM 1 G IJ SOLR
INTRAMUSCULAR | Status: AC
  Filled 2016-07-18: qty 10

## 2016-07-18 MED ORDER — HYDROCODONE-ACETAMINOPHEN 5-325 MG PO TABS: 1.0000 | ORAL_TABLET | Freq: Four times a day (QID) | ORAL | 0 refills | Status: DC | PRN

## 2016-07-18 MED ORDER — DOXYCYCLINE HYCLATE 100 MG PO TABS: 100.0000 mg | ORAL_TABLET | Freq: Two times a day (BID) | ORAL | 0 refills | Status: DC

## 2016-07-18 MED ORDER — CEFTRIAXONE SODIUM 1 G IJ SOLR
1.0000 g | Freq: Once | INTRAMUSCULAR | Status: AC
  Administered 2016-07-18: 1 g via INTRAMUSCULAR

## 2016-07-18 MED ORDER — LIDOCAINE HCL (PF) 1 % IJ SOLN
INTRAMUSCULAR | Status: AC
Start: 2016-07-18 — End: 2016-07-18
  Filled 2016-07-18: qty 2

## 2016-07-18 NOTE — Discharge Instructions (Signed)
If you are not significantly better in 24 hours, please either return here or see her primary care doctor for further evaluation.  Please sit in a tub of warm water for at least an hour today and an hour each day that you're home from work

## 2016-07-18 NOTE — ED Provider Notes (Signed)
[facility]    CSN: [id number] Arrival date & time: 07/18/16  1508     History   Chief Complaint Chief Complaint  Patient presents with  . Abdominal Pain  . Testicle Pain    HPI Ronald Castaneda is a 61 y.o. male.   This is 61 year old male who complains of constant low abd pain radiating into bilat testicles x 5 days; testicles swollen.  His symptoms are associated with n/v.  Felt feverish yesterday. He comes in with his wife.  Patient first noted the problem on Tuesday, 5 days ago. It reminds him of a small abscess he had a year ago. The pain is in his perineum, just behind the scrotum. He has no significant swelling in the scrotum, no urinary symptoms whatsoever. He has no rectal pain.  Patient works in [place] in the [employer].      Past Medical History:  Diagnosis Date  . Depression   . HIV infection [facility])     Patient Active Problem List   Diagnosis Date Noted  . Cervical disc disorder with radiculopathy of cervical region 06/17/2016  . Parotid nodule 06/17/2016  . Abscess of back 12/25/2015  . Erectile dysfunction 09/09/2015  . Depression 04/01/2015  . HIV disease ([place]) 02/27/2015  . Routine general medical examination at a health care facility 05/03/2014  . Lipoma of back 05/03/2014  . Tobacco abuse 05/03/2014    History reviewed. No pertinent surgical history.     Home Medications    Prior to Admission medications   Medication Sig Start Date End Date Taking? Authorizing Provider  Aspirin-Salicylamide-Caffeine (BC HEADACHE POWDER PO) Take 1 packet by mouth every 6 (six) hours as needed (pain). Reported on 07/01/2015   Yes Historical Provider, MD  GENVOYA 150-150-200-10 MG TABS tablet TAKE ONE TABLET BY MOUTH ONCE DAILY WITH BREAKFAST 05/04/16  Yes Michel Bickers, MD  doxycycline (VIBRA-TABS) 100 MG tablet Take 1 tablet (100 mg total) by mouth 2 (two) times daily. 07/18/16   Robyn Haber, MD  HYDROcodone-acetaminophen (NORCO) 5-325 MG  tablet Take 1 tablet by mouth every 6 (six) hours as needed for moderate pain. 07/18/16   Robyn Haber, MD    Family History Family History  Problem Relation Age of Onset  . Hypertension Mother     Social History Social History  Substance Use Topics  . Smoking status: Current Every Day Smoker    Packs/day: 0.50    Years: 44.00    Types: Cigarettes  . Smokeless tobacco: Never Used     Comment: stressed at work, unable to decrease at this time  . Alcohol use 3.6 oz/week    6 Cans of beer per week     Comment: weekend drinker     Allergies   Patient has no known allergies.   Review of Systems Review of Systems  Constitutional: Positive for diaphoresis.  Gastrointestinal: Positive for abdominal pain.  Genitourinary: Positive for testicular pain.     Physical Exam Triage Vital Signs ED Triage Vitals  Enc Vitals Group     BP 07/18/16 1530 141/82     Pulse Rate 07/18/16 1530 72     Resp 07/18/16 1530 16     Temp 07/18/16 1530 98.6 F (37 C)     Temp Source 07/18/16 1530 Oral     SpO2 07/18/16 1530 96 %     Weight --      Height --      Head Circumference --      Peak  Flow --      Pain Score 07/18/16 1532 6     Pain Loc --      Pain Edu? --      Excl. in [place]? --    No data found.   Updated Vital Signs BP 141/82   Pulse 72   Temp 98.6 F (37 C) (Oral)   Resp 16   SpO2 96%   Physical Exam  Constitutional: He is oriented to person, place, and time. He appears well-developed and well-nourished.  HENT:  Right Ear: External ear normal.  Left Ear: External ear normal.  Mouth/Throat: Oropharynx is clear and moist.  Eyes: Conjunctivae and EOM are normal. Pupils are equal, round, and reactive to light.  Neck: Normal range of motion. Neck supple.  Pulmonary/Chest: Effort normal.  Abdominal: Soft. There is no tenderness.  Genitourinary: Penis normal.  Genitourinary Comments: No rectal tenderness Perineum is mildly swollen and draining and tender in the  midline  Musculoskeletal: Normal range of motion.  Neurological: He is alert and oriented to person, place, and time.  Skin: Skin is warm and dry.  Nursing note and vitals reviewed.    UC Treatments / Results  Labs (all labs ordered are listed, but only abnormal results are displayed) Labs Reviewed  POCT URINALYSIS DIP (DEVICE) - Abnormal; Notable for the following:       Result Value   Hgb urine dipstick MODERATE (*)    Protein, ur 100 (*)    Leukocytes, UA TRACE (*)    All other components within normal limits    EKG  EKG Interpretation None       Radiology No results found.  Procedures Procedures (including critical care time)  Medications Ordered in UC Medications  cefTRIAXone (ROCEPHIN) injection 1 g (1 g Intramuscular Given 07/18/16 1602)     Initial Impression / Assessment and Plan / UC Course  I have reviewed the triage vital signs and the nursing notes.  Pertinent labs & imaging results that were available during my care of the patient were reviewed by me and considered in my medical decision making (see chart for details).     Final Clinical Impressions(s) / UC Diagnoses   Final diagnoses:  Perineal abscess, superficial    New Prescriptions New Prescriptions   DOXYCYCLINE (VIBRA-TABS) 100 MG TABLET    Take 1 tablet (100 mg total) by mouth 2 (two) times daily.   HYDROCODONE-ACETAMINOPHEN (NORCO) 5-325 MG TABLET    Take 1 tablet by mouth every 6 (six) hours as needed for moderate pain.     Robyn Haber, MD 07/18/16 [phone]

## 2016-07-18 NOTE — ED Triage Notes (Signed)
C/O constant low abd pain radiating into bilat testicles x 5 days; testicles swollen.  C/O n/v.  Felt feverish yesterday.

## 2016-07-20 DIAGNOSIS — B20 Human immunodeficiency virus [HIV] disease: Secondary | ICD-10-CM | POA: Diagnosis not present

## 2016-07-20 DIAGNOSIS — K116 Mucocele of salivary gland: Secondary | ICD-10-CM | POA: Diagnosis not present

## 2016-07-20 DIAGNOSIS — Z79899 Other long term (current) drug therapy: Secondary | ICD-10-CM | POA: Diagnosis not present

## 2016-07-22 ENCOUNTER — Ambulatory Visit (INDEPENDENT_AMBULATORY_CARE_PROVIDER_SITE_OTHER): Payer: BLUE CROSS/BLUE SHIELD | Admitting: Internal Medicine

## 2016-07-22 ENCOUNTER — Encounter: Payer: Self-pay | Admitting: Internal Medicine

## 2016-07-22 DIAGNOSIS — M5010 Cervical disc disorder with radiculopathy, unspecified cervical region: Secondary | ICD-10-CM | POA: Diagnosis not present

## 2016-07-22 DIAGNOSIS — K118 Other diseases of salivary glands: Secondary | ICD-10-CM

## 2016-07-22 DIAGNOSIS — L0291 Cutaneous abscess, unspecified: Secondary | ICD-10-CM | POA: Diagnosis not present

## 2016-07-22 NOTE — Assessment & Plan Note (Signed)
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 Not needing pain medication at this time.

## 2016-07-22 NOTE — Progress Notes (Signed)
Pre visit review using our clinic review tool, if applicable. No additional management support is needed unless otherwise documented below in the visit note. 

## 2016-07-22 NOTE — Patient Instructions (Addendum)
We are happy to fill out the term forms if needed.   Keep taking the antibiotics until they are gone.   It is okay to take tylenol or ibuprofen for the pain once you are back at work.

## 2016-07-22 NOTE — Assessment & Plan Note (Signed)
Taking doxycycline and hydrocodone with the pain only. He is improving clinically and less pain and swelling. This was not drained at urgent care due to his severe pain.

## 2016-07-22 NOTE — Assessment & Plan Note (Signed)
ENT has recommended surveillance clinically, no biopsy indicated. No new symptoms at this time so will continue to monitor with physical exam and history.

## 2016-07-22 NOTE — Progress Notes (Signed)
   Subjective:    Patient ID: Ronald Castaneda, male    DOB: [birth year], 61 y.o.   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 Going back Tuesday March 6th from the abscess.   Review of Systems  Constitutional: Positive for activity change. Negative for appetite change, chills, fatigue, fever and unexpected weight change.  HENT: Negative.   Eyes: Negative.   Respiratory: Negative for cough, chest tightness and shortness of breath.   Cardiovascular: Negative for chest pain, palpitations and leg swelling.  Gastrointestinal: Negative for abdominal distention, abdominal pain, constipation, diarrhea, nausea and vomiting.  Musculoskeletal: Positive for myalgias.       Improving as the abscess is shrinking  Skin: Positive for color change.       Abscess improving  Neurological: Negative.   Psychiatric/Behavioral: Negative.       Objective:   Physical Exam  Constitutional: He is oriented to person, place, and time. He appears well-developed and well-nourished.  HENT:  Head: Normocephalic and atraumatic.  Eyes: EOM are normal.  Neck: Normal range of motion. Neck supple. No JVD present. No thyromegaly present.  No cyst detected on exam  Cardiovascular: Normal rate and regular rhythm.   Pulmonary/Chest: Effort normal and breath sounds normal. No respiratory distress. He has no wheezes. He  has no rales.  Abdominal: Soft. Bowel sounds are normal. He exhibits no distension. There is no tenderness. There is no rebound.  Genitourinary:  Genitourinary Comments: Declines exam of the abscess today due to improvement  Musculoskeletal: He exhibits no edema.  Lymphadenopathy:    He has no cervical adenopathy.  Neurological: He is alert and oriented to person, place, and time. Coordination normal.  Skin: Skin is warm and dry.  Psychiatric: He has a normal mood and affect.   Vitals:   07/22/16 0836  BP: 118/76  Pulse: 69  Temp: 98.4 F (36.9 C)  TempSrc: Oral  SpO2: 99%  Weight: 149 lb (67.6 kg)  Height: 5' 9.5" (1.765 m)      Assessment & Plan:

## 2016-08-05 NOTE — Progress Notes (Signed)
[facility] Clinic Note - Initial Visit   Date: 08/06/16  Ronald Castaneda MRN: [id number] DOB: 1955/12/09   Dear Dr. Sharlet Castaneda:  Thank you for your kind referral of Ronald Castaneda for consultation of bilateral arm numbness. Although his history is well known to you, please allow [place] to reiterate it for the purpose of our medical record. The patient was accompanied to the clinic by self.    History of Present Illness: Ronald Castaneda is a 61 y.o. right-handed [place] American male with HIV, depression, tobacco use presenting for evaluation of bilateral arm numbness and tingling.    He works as a Programmer, systems and after returning from the bathroom on January 9th, he developed numbness over the lateral upper arm radiating into the forearm and heavy sensation.  Symptoms lasted about 10 minutes.  He did not have associated neck pain.  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  After symptoms improved, he went back to work.  The following day, he was driving and developed sharp neck pain which was followed by bilateral arm numbness and heaviness again. This lasted again 10-15 minutes.   He immediately turned off the forklift and went to the ER.  To evaluate for dissection, he had CTA head did not show any acute intracranial abnormality.  CTA neck did not show any evidence of extracranial vessel abnormalities. 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  He was evaluated by ENT and recommended to follow clinically.  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 He has been asked work on tankers, but he is scared that symptoms may occur again when climbing or working on the tanker.  Out-side paper records, electronic medical record,  and images have been reviewed where available and summarized as:  CTA head and neck 06/02/2016:  1. No acute intracranial abnormality identified. 2. Mild chronic microvascular ischemic changes of the brain. 3. No evidence of dissection, hemodynamically significant stenosis, or occlusion of the carotid and vertebral arteries of the neck.  4. No significant stenosis, proximal occlusion, aneurysm, or vascular malformation of circle of Willis. 5. Irregular hypoattenuating lesion within the left parotid gland measuring 25 x 16 x 19 mm with uncertain enhancement given arterial contrast bolus timing (series 5, image 79 and series 13, image 63). Differential includes parotid cystic lesion or neoplasm. Further characterization with soft tissue contrast CT or MRI of neck is recommended. 6. Soft tissue thickening of the dermis and subcutaneous fat in the suboccipital region of the scalp. Question cellulitis or hidradenitis. Clinical correlation recommended.   Past Medical History:  Diagnosis Date  . Depression   . HIV infection ([place])   . Tobacco use     Past Surgical History:  Procedure Laterality Date  . None       Medications:  Outpatient Encounter Prescriptions as of 08/06/2016  Medication Sig  . Aspirin-Salicylamide-Caffeine (BC HEADACHE POWDER PO) Take 1 packet by mouth every 6 (six) hours as needed (pain). Reported on 07/01/2015  . GENVOYA 150-150-200-10 MG TABS tablet TAKE ONE TABLET BY MOUTH ONCE DAILY WITH BREAKFAST  . [DISCONTINUED] doxycycline (VIBRA-TABS) 100 MG tablet Take 1 tablet (100 mg total) by mouth 2 (two) times daily.  . [DISCONTINUED] HYDROcodone-acetaminophen (NORCO) 5-325 MG tablet  Take 1 tablet by mouth every 6 (six) hours as needed for moderate pain.   No facility-administered encounter medications on file as of 08/06/2016.      Allergies: No Known Allergies  Family History: Family History  Problem Relation Age of Onset  . Hypertension Mother   . Healthy Sister     . Healthy Brother   . Diabetes type I Daughter     Social History: Social History  Substance Use Topics  . Smoking status: Current Every Day Smoker    Packs/day: 0.50    Years: 44.00    Types: Cigarettes  . Smokeless tobacco: Never Used     Comment: stressed at work, unable to decrease at this time  . Alcohol use 3.6 oz/week    6 Cans of beer per week     Comment: weekend drinker (3 -4 beers)   Social History   Social History Narrative   Lives with wife in a one story home.  Has 4 children.  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  Education: high school.     Review of Systems:  CONSTITUTIONAL: No fevers, chills, night sweats, or weight loss.   EYES: No visual changes or eye pain ENT: No hearing changes.  No history of nose bleeds.   RESPIRATORY: No cough, wheezing and shortness of breath.   CARDIOVASCULAR: Negative for chest pain, and palpitations.   GI: Negative for abdominal discomfort, blood in stools or black stools.  No recent change in bowel habits.   GU:  No history of incontinence.   MUSCLOSKELETAL: No history of joint pain or swelling.  No myalgias.   SKIN: Negative for lesions, rash, and itching.   HEMATOLOGY/ONCOLOGY: Negative for prolonged bleeding, bruising easily, and swollen nodes.     ENDOCRINE: Negative for cold or heat intolerance, polydipsia or goiter.   PSYCH:  No depression or anxiety symptoms.   NEURO: As Above.   Vital Signs:  BP 140/90   Pulse 72   Ht 5' 9.5" (1.765 m)   Wt 147 lb 9 oz (66.9 kg)   SpO2 98%   BMI 21.48 kg/m    General Medical Exam:   General:  Well appearing, comfortable.   Eyes/ENT: see cranial nerve examination.   Neck: No masses appreciated.  Full range of motion without tenderness.  No carotid bruits. Respiratory:  Clear to auscultation, good air entry bilaterally.   Cardiac:  Regular rate and rhythm, no murmur.   Extremities:  No deformities, edema, or skin discoloration.   Skin:  No rashes or lesions.  Neurological Exam: MENTAL STATUS including orientation to time, place, person, recent and remote memory, attention span and concentration, language, and fund of knowledge is normal.  Speech is not dysarthric.  CRANIAL NERVES: II:  No visual field defects.  Unremarkable fundi.   III-IV-VI: Pupils equal round and reactive to light.  Normal conjugate, extra-ocular eye movements in all directions of gaze.  No nystagmus.  No ptosis.   V:  Normal facial sensation.     VII:  Normal facial symmetry and movements.  VIII:  Normal hearing and vestibular function.   IX-X:  Normal palatal movement.   XI:  Normal shoulder shrug and head rotation.   XII:  Normal tongue strength and range of motion, no deviation or fasciculation.  MOTOR:  No atrophy, fasciculations or abnormal movements.  No pronator drift.  Tone is normal.    Right Upper Extremity:  Left Upper Extremity:    Deltoid  5/5   Deltoid  5/5   Biceps  5/5   Biceps  5/5   Triceps  5/5   Triceps  5/5   Wrist extensors  5/5   Wrist extensors  5/5   Wrist flexors  5/5   Wrist flexors  5/5   Finger extensors  5/5   Finger extensors  5/5   Finger flexors  5/5   Finger flexors  5/5   Dorsal interossei  5-/5   Dorsal interossei  5-/5   Abductor pollicis  5/5   Abductor pollicis  5/5   Tone (Ashworth scale)  0  Tone (Ashworth scale)  0   Right Lower Extremity:    Left Lower Extremity:    Hip flexors  5/5   Hip flexors  5/5   Hip extensors  5/5   Hip extensors  5/5   Knee flexors  5/5   Knee flexors  5/5   Knee extensors  5/5   Knee extensors  5/5   Dorsiflexors  5/5   Dorsiflexors  5/5   Plantarflexors  5/5   Plantarflexors  5/5   Toe extensors  5/5   Toe extensors  5/5   Toe flexors  5/5   Toe flexors  5/5   Tone (Ashworth scale)  0  Tone (Ashworth scale)  0   MSRs:  Right                                                                 Left brachioradialis 3+  brachioradialis 3+  biceps 3+  biceps 3+   triceps 2+  triceps 2+  patellar 2+  patellar 2+  ankle jerk 2+  ankle jerk 2+  Hoffman no  Hoffman no  plantar response down  plantar response down   SENSORY:  Normal and symmetric perception of light touch, pinprick, vibration, and proprioception.  Romberg's sign absent.   COORDINATION/GAIT: Normal finger-to- nose-finger and heel-to-shin.  Intact rapid alternating movements bilaterally.  Able to rise from a chair without using arms.  Gait narrow based and stable. Tandem and stressed gait intact.    IMPRESSION: Ronald Castaneda is a 61 year-old gentleman with history of HIV referred for evaluation of transient bilateral arm paresthesias and weakness.  The distribution of his symptoms are suggestive of C5-C6 radiculopathy.  With his work history of working on a forklift and constant neck rotation, he may certainly have some degenerative changes of the cervical spine.  His exam shows symmetrically brisk brachioradialis and biceps reflexes.  Motor strength and sensation is preserved.  He will have MRI cervical spine wwo contrast to evaluate for cervical myelopathy.  Contrasted study will be obtained to be sure pathology intrinsic to the cord is not missed with his history of HIV. In the meantime, letter was provided specifying work restriction to not use the forklift and avoid hyperextension and neck rotation.  Further recommendations will be based on the results of his testing.  The duration of this appointment visit was 45 minutes of face-to-face time with the patient.  Greater than 50% of this time was spent in counseling, explanation of diagnosis, planning of further management, and coordination of care.   Thank you for allowing me to participate in  patient's care.  If I can answer any additional questions, I would be pleased to do so.    Sincerely,    Donika K. Posey Pronto, DO

## 2016-08-06 ENCOUNTER — Ambulatory Visit (INDEPENDENT_AMBULATORY_CARE_PROVIDER_SITE_OTHER): Payer: BLUE CROSS/BLUE SHIELD | Admitting: Neurology

## 2016-08-06 ENCOUNTER — Encounter: Payer: Self-pay | Admitting: *Deleted

## 2016-08-06 ENCOUNTER — Encounter: Payer: Self-pay | Admitting: Neurology

## 2016-08-06 VITALS — BP 140/90 | HR 72 | Ht 69.5 in | Wt 147.6 lb

## 2016-08-06 DIAGNOSIS — B20 Human immunodeficiency virus [HIV] disease: Secondary | ICD-10-CM | POA: Diagnosis not present

## 2016-08-06 DIAGNOSIS — R292 Abnormal reflex: Secondary | ICD-10-CM | POA: Diagnosis not present

## 2016-08-06 DIAGNOSIS — G959 Disease of spinal cord, unspecified: Secondary | ICD-10-CM

## 2016-08-06 DIAGNOSIS — Z21 Asymptomatic human immunodeficiency virus [HIV] infection status: Secondary | ICD-10-CM

## 2016-08-06 NOTE — Patient Instructions (Signed)
1.  MRI cervical spine wwo contrast 2.  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

## 2016-08-18 ENCOUNTER — Ambulatory Visit
Admission: RE | Admit: 2016-08-18 | Discharge: 2016-08-18 | Disposition: A | Discharge status: Home / Self Care | Payer: BLUE CROSS/BLUE SHIELD | Source: Ambulatory Visit | Attending: Neurology | Admitting: Neurology

## 2016-08-18 DIAGNOSIS — G959 Disease of spinal cord, unspecified: Secondary | ICD-10-CM

## 2016-08-18 DIAGNOSIS — M4802 Spinal stenosis, cervical region: Secondary | ICD-10-CM | POA: Diagnosis not present

## 2016-08-18 MED ORDER — GADOBENATE DIMEGLUMINE 529 MG/ML IV SOLN
14.0000 mL | Freq: Once | INTRAVENOUS | Status: AC | PRN
  Administered 2016-08-18: 14 mL via INTRAVENOUS

## 2016-08-19 ENCOUNTER — Telehealth: Payer: Self-pay | Admitting: Neurology

## 2016-08-19 NOTE — Telephone Encounter (Signed)
I attempted to contact patient via phone today regarding the results of MRI cervical spine, however there was no answer so a message was left for the patient to return my call.   Garfield Coiner K. Posey Pronto, DO

## 2016-08-23 ENCOUNTER — Telehealth: Payer: Self-pay | Admitting: Neurology

## 2016-08-23 NOTE — Telephone Encounter (Signed)
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 No compressive central canal stenosis. Foraminal encroachment at those levels by osteophytes that would have some potential to affect the exiting nerve roots, though definite neural compression is not established at those levels. Discogenic marrow changes could be associated with neck pain.  C7-T1 facet arthropathy with edema and enhancement. 3 mm anterolisthesis. No compressive central canal stenosis. Foraminal narrowing that could affect either or both C8 nerve roots. The facet arthropathy could certainly be painful.  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 He will let us know if he needs additional paperwork completed to specific his work restrictions.  Donika K. Posey Pronto, DO

## 2016-08-23 NOTE — Telephone Encounter (Signed)
Pt called said he was returning Dr. Serita Grit call.  He is at work and would like her to return the call after 4pm.

## 2016-08-23 NOTE — Telephone Encounter (Signed)
Did you call patient?

## 2017-02-23 ENCOUNTER — Telehealth: Payer: Self-pay | Admitting: *Deleted

## 2017-02-23 ENCOUNTER — Other Ambulatory Visit: Payer: Self-pay | Admitting: Internal Medicine

## 2017-02-23 DIAGNOSIS — B20 Human immunodeficiency virus [HIV] disease: Secondary | ICD-10-CM

## 2017-02-23 NOTE — Telephone Encounter (Signed)
Patient called for appointment. It is unclear if he has been on his meds. Appointment made with pharmacy for 03/01/17 and to get labs same day.

## 2017-03-01 ENCOUNTER — Other Ambulatory Visit (HOSPITAL_COMMUNITY)
Admission: RE | Admit: 2017-03-01 | Discharge: 2017-03-01 | Disposition: A | Discharge status: Home / Self Care | Payer: BLUE CROSS/BLUE SHIELD | Source: Ambulatory Visit | Attending: Internal Medicine | Admitting: Internal Medicine

## 2017-03-01 ENCOUNTER — Other Ambulatory Visit: Payer: BLUE CROSS/BLUE SHIELD

## 2017-03-01 ENCOUNTER — Ambulatory Visit (INDEPENDENT_AMBULATORY_CARE_PROVIDER_SITE_OTHER): Payer: BLUE CROSS/BLUE SHIELD | Admitting: Pharmacist Clinician (PhC)/ Clinical Pharmacy Specialist

## 2017-03-01 DIAGNOSIS — B20 Human immunodeficiency virus [HIV] disease: Secondary | ICD-10-CM | POA: Insufficient documentation

## 2017-03-01 DIAGNOSIS — Z23 Encounter for immunization: Secondary | ICD-10-CM | POA: Diagnosis not present

## 2017-03-01 DIAGNOSIS — F329 Major depressive disorder, single episode, unspecified: Secondary | ICD-10-CM | POA: Insufficient documentation

## 2017-03-01 DIAGNOSIS — F17210 Nicotine dependence, cigarettes, uncomplicated: Secondary | ICD-10-CM | POA: Insufficient documentation

## 2017-03-01 MED ORDER — BICTEGRAVIR-EMTRICITAB-TENOFOV 50-200-25 MG PO TABS: 1.0000 | ORAL_TABLET | Freq: Every day | ORAL | 3 refills | Status: DC

## 2017-03-01 MED FILL — BIKTARVY 50-200-25 MG TABS: 50-200-25 | 30 days supply | Qty: 30 | Fill #0

## 2017-03-01 NOTE — Patient Instructions (Signed)
Stop [facility] 1 daily  Follow up with Dr. Megan Salon 10/24 Follow up with me Nov for the second vaccine and labs

## 2017-03-01 NOTE — Progress Notes (Signed)
HPI: Ronald Castaneda is a 61 y.o. male who his here to see pharmacy after not being seen in a year.   Allergies: No Known Allergies  Vitals:    Past Medical History: Past Medical History:  Diagnosis Date  . Depression   . HIV infection ([place])   . Tobacco use     Social History: Social History   Social History  . Marital status: Married    Spouse name: N/A  . Number of children: 4  . Years of education: 99   Social History Main Topics  . Smoking status: Current Every Day Smoker    Packs/day: 0.50    Years: 44.00    Types: Cigarettes  . Smokeless tobacco: Never Used     Comment: stressed at work, unable to decrease at this time  . Alcohol use 3.6 oz/week    6 Cans of beer per week     Comment: weekend drinker (3 -4 beers)  . Drug use: No  . Sexual activity: Not on file     Comment: given condoms   Other Topics Concern  . Not on file   Social History Narrative   Lives with wife in a one story home.  Has 4 children.  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  Education: high school.     Previous Regimen:   Current Regimen: Genvoya  Labs: HIV 1 RNA Quant (copies/mL)  Date Value  03/16/2016 <20  09/09/2015 <20  03/11/2015 13,932 (H)   CD4 T Cell Abs (/uL)  Date Value  03/16/2016 310 (L)  09/09/2015 430  03/11/2015 310 (L)   Hep B S Ab (no units)  Date Value  03/11/2015 NEG   Hepatitis B Surface Ag (no units)  Date Value  03/11/2015 NEGATIVE   HCV Ab (no units)  Date Value  03/11/2015 NEGATIVE    CrCl: CrCl cannot be calculated (Patient's most recent lab result is older than the maximum 21 days allowed.).  Lipids:    Component Value Date/Time   CHOL 139 03/11/2015 1534   TRIG 192 (H) 03/11/2015 1534   HDL 30 (L) 03/11/2015 1534   CHOLHDL 4.6 03/11/2015 1534   VLDL 38 (H) 03/11/2015 1534   LDLCALC 71 03/11/2015 1534    Assessment: [name] is here after not being seen since 10/17. He has well on  Genvoya until he ran out of refill about 3-4 days ago. Jackie sent in a month supply but he has not picked that up. 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 He said that he has not missed any doses before he ran out. He has not noticed any STDs symptoms. He has an appt with Dr. Sharlet Salina coming for his neck pain. Counseled him on compliance again. Since he has not pick up his supply of Genovya, showed him again the different options that are available today. He decided to switch to [facility] due to the lower potential for interactions, food requirement, smaller size. [facility] will mail it out to him. He needs flu, hep A/B. He agreed to hep A/B but adamantly refused the flu because it made him feel bad. He will come back in 1 month for the 2nd hep B and VL. He has an appt with Dr. Megan Salon on 10/24. We will get all labs today.   Recommendations:  Stop [employer] 1 daily HIV labs, GC, RPR, lipids Hep A/B #1 today F/u with Dr  Megan Salon in 2 wks F/u with me in Nov for 2nd hep B and HIV labs  Onnie Boer, PharmD, BCPS, AAHIVP, CPP Clinical Infect[facility] 03/01/2017, 2:44 PM

## 2017-03-02 LAB — BASIC METABOLIC PANEL
BUN: 17 mg/dL (ref 7–25)
CALCIUM: 9 mg/dL (ref 8.6–10.3)
CO2: 29 mmol/L (ref 20–32)
Chloride: 103 mmol/L (ref 98–110)
Creat: 0.94 mg/dL (ref 0.70–1.25)
GLUCOSE: 100 mg/dL — AB (ref 65–99)
Potassium: 4.3 mmol/L (ref 3.5–5.3)
Sodium: 141 mmol/L (ref 135–146)

## 2017-03-02 LAB — URINE CYTOLOGY ANCILLARY ONLY
CHLAMYDIA, DNA PROBE: NEGATIVE
Neisseria Gonorrhea: NEGATIVE

## 2017-03-02 LAB — LIPID PANEL
CHOL/HDL RATIO: 3.4 (calc) (ref <5.0)
Cholesterol: 161 mg/dL (ref <200)
HDL: 48 mg/dL (ref >40)
LDL CHOLESTEROL (CALC): 90 mg/dL
Non-HDL Cholesterol (Calc): 113 mg/dL (calc) (ref <130)
Triglycerides: 125 mg/dL (ref <150)

## 2017-03-02 LAB — RPR: RPR: NONREACTIVE

## 2017-03-03 LAB — T-HELPER CELL (CD4) - (RCID CLINIC ONLY)
CD4 T CELL HELPER: 17 % — AB (ref 33–55)
CD4 T Cell Abs: 420 /uL (ref 400–2700)

## 2017-03-10 ENCOUNTER — Ambulatory Visit (INDEPENDENT_AMBULATORY_CARE_PROVIDER_SITE_OTHER): Payer: BLUE CROSS/BLUE SHIELD | Admitting: Internal Medicine

## 2017-03-10 ENCOUNTER — Encounter: Payer: Self-pay | Admitting: Internal Medicine

## 2017-03-10 DIAGNOSIS — M5010 Cervical disc disorder with radiculopathy, unspecified cervical region: Secondary | ICD-10-CM

## 2017-03-10 MED ORDER — CYCLOBENZAPRINE HCL 5 MG PO TABS: 5.0000 mg | ORAL_TABLET | Freq: Three times a day (TID) | ORAL | 1 refills | Status: DC | PRN

## 2017-03-10 NOTE — Patient Instructions (Signed)
We have sent in the muscle relaxer to use if needed.    Cervical Strain and Sprain Rehab Ask your health care provider which exercises are safe for you. Do exercises exactly as told by your health care provider and adjust them as directed. It is normal to feel mild stretching, pulling, tightness, or discomfort as you do these exercises, but you should stop right away if you feel sudden pain or your pain gets worse.Do not begin these exercises until told by your health care provider. Stretching and range of motion exercises These exercises warm up your muscles and joints and improve the movement and flexibility of your neck. These exercises also help to relieve pain, numbness, and tingling. Exercise A: Cervical side bend  1. Using good posture, sit on a stable chair or stand up. 2. Without moving your shoulders, slowly tilt your left / right ear to your shoulder until you feel a stretch in your neck muscles. You should be looking straight ahead. 3. Hold for __________ seconds. 4. Repeat with the other side of your neck. Repeat __________ times. Complete this exercise __________ times a day. Exercise B: Cervical rotation  1. Using good posture, sit on a stable chair or stand up. 2. Slowly turn your head to the side as if you are looking over your left / right shoulder. ? Keep your eyes level with the ground. ? Stop when you feel a stretch along the side and the back of your neck. 3. Hold for __________ seconds. 4. Repeat this by turning to your other side. Repeat __________ times. Complete this exercise __________ times a day. Exercise C: Thoracic extension and pectoral stretch 1. Roll a towel or a small blanket so it is about 4 inches (10 cm) in diameter. 2. Lie down on your back on a firm surface. 3. Put the towel lengthwise, under your spine in the middle of your back. It should not be not under your shoulder blades. The towel should line up with your spine from your middle back to your  lower back. 4. Put your hands behind your head and let your elbows fall out to your sides. 5. Hold for __________ seconds. Repeat __________ times. Complete this exercise __________ times a day. Strengthening exercises These exercises build strength and endurance in your neck. Endurance is the ability to use your muscles for a long time, even after your muscles get tired. Exercise D: Upper cervical flexion, isometric 1. Lie on your back with a thin pillow behind your head and a small rolled-up towel under your neck. 2. Gently tuck your chin toward your chest and nod your head down to look toward your feet. Do not lift your head off the pillow. 3. Hold for __________ seconds. 4. Release the tension slowly. Relax your neck muscles completely before you repeat this exercise. Repeat __________ times. Complete this exercise __________ times a day. Exercise E: Cervical extension, isometric  1. Stand about 6 inches (15 cm) away from a wall, with your back facing the wall. 2. Place a soft object, about 6-8 inches (15-20 cm) in diameter, between the back of your head and the wall. A soft object could be a small pillow, a ball, or a folded towel. 3. Gently tilt your head back and press into the soft object. Keep your jaw and forehead relaxed. 4. Hold for __________ seconds. 5. Release the tension slowly. Relax your neck muscles completely before you repeat this exercise. Repeat __________ times. Complete this exercise __________ times a day.  Posture and body mechanics  Body mechanics refers to the movements and positions of your body while you do your daily activities. Posture is part of body mechanics. Good posture and healthy body mechanics can help to relieve stress in your body's tissues and joints. Good posture means that your spine is in its natural S-curve position (your spine is neutral), your shoulders are pulled back slightly, and your head is not tipped forward. The following are general  guidelines for applying improved posture and body mechanics to your everyday activities. Standing  When standing, keep your spine neutral and keep your feet about hip-width apart. Keep a slight bend in your knees. Your ears, shoulders, and hips should line up.  When you do a task in which you stand in one place for a long time, place one foot up on a stable object that is 2-4 inches (5-10 cm) high, such as a footstool. This helps keep your spine neutral. Sitting   When sitting, keep your spine neutral and your keep feet flat on the floor. Use a footrest, if necessary, and keep your thighs parallel to the floor. Avoid rounding your shoulders, and avoid tilting your head forward.  When working at a desk or a computer, keep your desk at a height where your hands are slightly lower than your elbows. Slide your chair under your desk so you are close enough to maintain good posture.  When working at a computer, place your monitor at a height where you are looking straight ahead and you do not have to tilt your head forward or downward to look at the screen. Resting When lying down and resting, avoid positions that are most painful for you. Try to support your neck in a neutral position. You can use a contour pillow or a small rolled-up towel. Your pillow should support your neck but not push on it. This information is not intended to replace advice given to you by your health care provider. Make sure you discuss any questions you have with your health care provider. Document Released: 05/10/2005 Document Revised: 01/15/2016 Document Reviewed: 04/16/2015 Elsevier Interactive Patient Education  [employer].

## 2017-03-10 NOTE — Progress Notes (Signed)
   Subjective:    Patient ID: Ronald Castaneda, male    DOB: August 30, 1955, 61 y.o.   MRN: [id number]  HPI The patient is a 61 YO man coming in for neck pain. He previously worked on a Scientific laboratory technician but this was giving him a lot of neck problems with pain and numbness down both arms. 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 He has not taken any medication for this recently. Denies pain today. Some stiffness when waking up lasting <30 minutes. No numbness except during flare. Denies injury or overuse recently.   Review of Systems  Constitutional: Negative.   Respiratory: Negative for cough, chest tightness and shortness of breath.   Cardiovascular: Negative for chest pain, palpitations and leg swelling.  Gastrointestinal: Negative for abdominal distention, abdominal pain, constipation, diarrhea, nausea and vomiting.  Musculoskeletal: Positive for arthralgias and myalgias. Negative for back pain, gait problem, joint swelling, neck pain and neck stiffness.  Skin: Negative.   Neurological: Positive for numbness. Negative for dizziness, tremors, syncope, facial asymmetry and weakness.      Objective:   Physical Exam  Constitutional: He is oriented to person, place, and time. He appears well-developed and well-nourished.  HENT:  Head: Normocephalic and atraumatic.  Eyes: EOM are normal.  Cardiovascular: Normal rate and regular rhythm.   Pulmonary/Chest: Effort normal.  Abdominal: Soft.  Musculoskeletal: He exhibits no edema.  Neurological: He is alert and oriented to person, place, and time. No cranial nerve deficit. Coordination normal.  Skin: Skin is warm and dry.   Vitals:   03/10/17 0810  BP: (!) 142/78  Pulse: 67  Temp: 98 F (36.7 C)  TempSrc: Oral  SpO2: 98%  Weight: 145 lb (65.8 kg)  Height: 5' 9.5" (1.765 m)      Assessment & Plan:

## 2017-03-10 NOTE — Assessment & Plan Note (Signed)
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 Rx for flexeril as needed for flare as well as stretches to help with less flares.

## 2017-03-15 LAB — HIV RNA, RTPCR W/R GT (RTI, PI,INT)
HIV 1 RNA QUANT: 1260 {copies}/mL — AB
HIV-1 RNA QUANT, LOG: 3.1 {Log_copies}/mL — AB

## 2017-03-15 LAB — HIV-1 INTEGRASE GENOTYPE

## 2017-03-15 LAB — HIV-1 GENOTYPE: HIV-1 Genotype: DETECTED — AB

## 2017-03-16 ENCOUNTER — Ambulatory Visit (INDEPENDENT_AMBULATORY_CARE_PROVIDER_SITE_OTHER): Payer: BLUE CROSS/BLUE SHIELD | Admitting: Internal Medicine

## 2017-03-16 ENCOUNTER — Encounter: Payer: Self-pay | Admitting: Internal Medicine

## 2017-03-16 DIAGNOSIS — B20 Human immunodeficiency virus [HIV] disease: Secondary | ICD-10-CM | POA: Diagnosis not present

## 2017-03-16 DIAGNOSIS — Z720 Tobacco use: Secondary | ICD-10-CM | POA: Diagnosis not present

## 2017-03-16 DIAGNOSIS — F330 Major depressive disorder, recurrent, mild: Secondary | ICD-10-CM | POA: Diagnosis not present

## 2017-03-16 DIAGNOSIS — M5010 Cervical disc disorder with radiculopathy, unspecified cervical region: Secondary | ICD-10-CM | POA: Diagnosis not present

## 2017-03-16 MED ORDER — DARUN-COBIC-EMTRICIT-TENOFAF 800-150-200-10 MG PO TABS: 1.0000 | ORAL_TABLET | Freq: Every day | ORAL | 11 refills | Status: DC

## 2017-03-16 MED ORDER — DOLUTEGRAVIR SODIUM 50 MG PO TABS: 50.0000 mg | ORAL_TABLET | Freq: Every day | ORAL | 11 refills | Status: DC

## 2017-03-16 MED FILL — TIVICAY 50 MG TABLET: 50 | 30 days supply | Qty: 30 | Fill #0

## 2017-03-16 MED FILL — SYMTUZA 800-150-200-10 MG T: 800-150-200 | 30 days supply | Qty: 30 | Fill #0

## 2017-03-16 NOTE — Assessment & Plan Note (Signed)
He has mild but improving depression. I talked him about the counselors that are available here and asked him to please let us know if he would like to see one at anytime in the future.

## 2017-03-16 NOTE — Assessment & Plan Note (Signed)
I congratulated him on trying to cut down on his cigarettes but encouraged him to consider developing a quit plan and setting a quit date.

## 2017-03-16 NOTE — Progress Notes (Signed)
Patient Active Problem List   Diagnosis Date Noted  . HIV disease ([place]) 02/27/2015    Priority: High  . Cervical disc disorder with radiculopathy of cervical region 06/17/2016  . Parotid nodule 06/17/2016  . Abscess 12/25/2015  . Erectile dysfunction 09/09/2015  . Depression 04/01/2015  . Routine general medical examination at a health care facility 05/03/2014  . Lipoma of back 05/03/2014  . Tobacco abuse 05/03/2014    Patient's Medications  New Prescriptions   DARUNAVIR-COBICISCTAT-EMTRICITABINE-TENOFOVIR ALAFENAMIDE (SYMTUZA) 800-150-200-10 MG TABS    Take 1 tablet by mouth daily with breakfast.   DOLUTEGRAVIR (TIVICAY) 50 MG TABLET    Take 1 tablet (50 mg total) by mouth daily.  Previous Medications   ASPIRIN-SALICYLAMIDE-CAFFEINE (BC HEADACHE POWDER PO)    Take 1 packet by mouth every 6 (six) hours as needed (pain). Reported on 07/01/2015   CYCLOBENZAPRINE (FLEXERIL) 5 MG TABLET    Take 1 tablet (5 mg total) by mouth 3 (three) times daily as needed for muscle spasms.  Modified Medications   No medications on file  Discontinued Medications   BICTEGRAVIR-EMTRICITABINE-TENOFOVIR AF (BIKTARVY) 50-200-25 MG TABS TABLET    Take 1 tablet by mouth daily.   GENVOYA 150-150-200-10 MG TABS TABLET        Subjective: Ronald Castaneda is in for his first visit with me since October of last year. He recently called and said that he had ran out of [facility] and was seen by our pharmacist, Ronald Castaneda. He said that he does only missed 3-4 days after his supply ran out but he now tells me that he was missing more frequently over the summer when his schedule at work was changing every week. He says he was under a great deal of stress at work and financially. He has not had any problem with his co-pays however as he has a co-pay card. When he was in recently he was switched to [place] so he did not have to take it with food but he never made to switch. He has continued to take Genvoya since that visit.  His CD4 count was 420 but his viral load was 1260. A genotype resistance assay did not reveal any mutations but integrase inhibitor resistance testing predicted resistance to elvitegravir. His work schedule has improved recently and he is working the day shift full-time. 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 He is having less pain in his neck that shoots down his arms. He is not feeling as depressed. He says he is trying to cut down on his cigarettes.   Review of Systems: Review of Systems  Constitutional: Negative for chills, diaphoresis, fever, malaise/fatigue and weight loss.  HENT: Negative for sore throat.   Respiratory: Negative for cough, sputum production and shortness of breath.   Cardiovascular: Negative for chest pain.  Gastrointestinal: Negative for abdominal pain, diarrhea, heartburn, nausea and vomiting.  Genitourinary: Negative for dysuria and frequency.  Musculoskeletal: Positive for joint pain and neck pain. Negative for myalgias.  Skin: Negative for rash.  Neurological: Negative for dizziness and headaches.  Psychiatric/Behavioral: Positive for depression. Negative for substance abuse. The patient is not nervous/anxious.     Past Medical History:  Diagnosis Date  . Depression   . HIV infection ([place])   . Tobacco use     Social History  Substance Use Topics  . Smoking status: Current Every Day Smoker    Packs/day: 0.50  Years: 44.00    Types: Cigarettes  . Smokeless tobacco: Never Used     Comment: stressed at work, unable to decrease at this time  . Alcohol use 3.6 oz/week    6 Cans of beer per week     Comment: weekend drinker (3 -4 beers)    Family History  Problem Relation Age of Onset  . Hypertension Mother   . Healthy Sister   . Healthy Brother   . Diabetes type I Daughter     No Known Allergies  Health Maintenance  Topic Date Due  . TETANUS/TDAP  02/26/1975  . COLONOSCOPY  02/25/2006  . INFLUENZA VACCINE   08/22/2017 (Originally 12/22/2016)  . Hepatitis C Screening  Completed  . HIV Screening  Completed    Objective:  Vitals:   03/16/17 1335  BP: (!) 151/87  Pulse: 73  Temp: 98.7 F (37.1 C)  TempSrc: Oral  Weight: 146 lb (66.2 kg)  Height: 5' 9.5" (1.765 m)   Body mass index is 21.25 kg/m.  Physical Exam  Constitutional: He is oriented to person, place, and time. No distress.  HENT:  Mouth/Throat: No oropharyngeal exudate.  Eyes: Conjunctivae are normal.  Cardiovascular: Normal rate and regular rhythm.   No murmur heard. Pulmonary/Chest: Effort normal and breath sounds normal.  Abdominal: Soft. He exhibits no mass. There is no tenderness.  Musculoskeletal: Normal range of motion.  Neurological: He is alert and oriented to person, place, and time.  Skin: No rash noted.  Psychiatric: Mood and affect normal.  He seems a little down today.    Lab Results Lab Results  Component Value Date   WBC 7.9 06/02/2016   HGB 15.3 06/02/2016   HCT 43.7 06/02/2016   MCV 82.1 06/02/2016   PLT 248 06/02/2016    Lab Results  Component Value Date   CREATININE 0.94 03/01/2017   BUN 17 03/01/2017   NA 141 03/01/2017   K 4.3 03/01/2017   CL 103 03/01/2017   CO2 29 03/01/2017    Lab Results  Component Value Date   ALT 16 09/09/2015   AST 25 09/09/2015   ALKPHOS 95 09/09/2015   BILITOT 0.3 09/09/2015    Lab Results  Component Value Date   CHOL 161 03/01/2017   HDL 48 03/01/2017   LDLCALC 71 03/11/2015   TRIG 125 03/01/2017   CHOLHDL 3.4 03/01/2017   Lab Results  Component Value Date   LABRPR NON-REACTIVE 03/01/2017   HIV 1 RNA Quant (copies/mL)  Date Value  03/01/2017 1,260 (H)  03/16/2016 <20  09/09/2015 <20   CD4 T Cell Abs (/uL)  Date Value  03/01/2017 420  03/16/2016 310 (L)  09/09/2015 430     Problem List Items Addressed This Visit      High   HIV disease ([place])    He fell out of care and missed his visit 6 months ago. His adherence to deteriorated  and he now has some and agrees inhibitor resistance. I discussed options with Ronald Castaneda and we both spoke with Ronald Castaneda. We have recommended a combination of Symtuza and Tivicay once daily. This should be effective regimen and also have a higher barrier to further development of resistance. He will be co-pay cards for these area I will see him back in 4 weeks.      Relevant Medications   Darunavir-Cobicisctat-Emtricitabine-Tenofovir Alafenamide (SYMTUZA) 800-150-200-10 MG TABS   dolutegravir (TIVICAY) 50 MG tablet     Unprioritized   Cervical disc disorder with radiculopathy of  cervical region    His neck pain has improved with a change in his job description at work. He is also taking cyclobenzaprine every few days as needed and finds that this helps.      Depression    He has mild but improving depression. I talked him about the counselors that are available here and asked him to please let us know if he would like to see one at anytime in the future.      Tobacco abuse    I congratulated him on trying to cut down on his cigarettes but encouraged him to consider developing a quit plan and setting a quit date.           Michel Bickers, MD [facility] for Infectious [place] [facility] [phone] pager   [phone] cell 03/16/2017, 2:07 PM

## 2017-03-16 NOTE — Progress Notes (Signed)
HPI: Ronald Castaneda is a 61 y.o. male who is here to f/u with Dr. Megan Salon for his HIV.   Allergies: No Known Allergies  Vitals: Temp: 98.7 F (37.1 C) (10/24 1335) Temp Source: Oral (10/24 1335) BP: 151/87 (10/24 1335) Pulse Rate: 73 (10/24 1335)  Past Medical History: Past Medical History:  Diagnosis Date  . Depression   . HIV infection ([place])   . Tobacco use     Social History: Social History   Social History  . Marital status: Married    Spouse name: N/A  . Number of children: 4  . Years of education: 36   Social History Main Topics  . Smoking status: Current Every Day Smoker    Packs/day: 0.50    Years: 44.00    Types: Cigarettes  . Smokeless tobacco: Never Used     Comment: stressed at work, unable to decrease at this time  . Alcohol use 3.6 oz/week    6 Cans of beer per week     Comment: weekend drinker (3 -4 beers)  . Drug use: No  . Sexual activity: Not Asked     Comment: given condoms   Other Topics Concern  . None   Social History Narrative   Lives with wife in a one story home.  Has 4 children.  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  Education: high school.     Previous Regimen: Genvoya  Current Regimen: Genvoya  Labs: HIV 1 RNA Quant (copies/mL)  Date Value  03/01/2017 1,260 (H)  03/16/2016 <20  09/09/2015 <20   CD4 T Cell Abs (/uL)  Date Value  03/01/2017 420  03/16/2016 310 (L)  09/09/2015 430   Hep B S Ab (no units)  Date Value  03/11/2015 NEG   Hepatitis B Surface Ag (no units)  Date Value  03/11/2015 NEGATIVE   HCV Ab (no units)  Date Value  03/11/2015 NEGATIVE    CrCl: Estimated Creatinine Clearance: 77.3 mL/min (by C-G formula based on SCr of 0.94 mg/dL).  Lipids:    Component Value Date/Time   CHOL 161 03/01/2017 1459   TRIG 125 03/01/2017 1459   HDL 48 03/01/2017 1459   CHOLHDL 3.4 03/01/2017 1459   VLDL 38 (H) 03/11/2015 1534   LDLCALC 71 03/11/2015 1534    HIV Genotype Composite Data Genotype Dates:   Mutations in Bold impact drug susceptibility RT Mutations None  PI Mutations None  Integrase Mutations T66TI   Interpretation of Genotype Data per [employer] HIV Database Nucleoside RTIs     Non-Nucleoside RTIs     Protease Inhibitors     Integrase Inhibitors  bictegravir (BIC) Susceptible dolutegravir (DTG) Susceptible elvitegravir (EVG) High-Level Resistance raltegravir (RAL) Low-Level Resistance    Assessment: Ronald Castaneda saw me on 10/9 after missing in action since 2017. At the visit he stated that he was regularly taking his Genvoya until he ran out of refills. I ordered a complete resistance panel on him. It came back with elvitegravir resistance now. He hasn't started on Biktarvy yet. Surprisingly, it did not pick up any other mutations. Dr. Megan Salon will change his regimen to Symtuza/Tivicay.   Recommendations:  Change Genvoya/Biktarvy to Symtuza + Tivicay F/u with me in Nov for second hep [place], PharmD, BCPS, AAHIVP, CPP Clinical Infectious [place] for Infectious Disease 03/16/2017, 2:27 PM

## 2017-03-16 NOTE — Assessment & Plan Note (Signed)
He fell out of care and missed his visit 6 months ago. His adherence to deteriorated and he now has some and agrees inhibitor resistance. I discussed options with Onnie Boer and we both spoke with [place]. We have recommended a combination of Symtuza and Tivicay once daily. This should be effective regimen and also have a higher barrier to further development of resistance. He will be co-pay cards for these area I will see him back in 4 weeks.

## 2017-03-16 NOTE — Assessment & Plan Note (Signed)
His neck pain has improved with a change in his job description at work. He is also taking cyclobenzaprine every few days as needed and finds that this helps.

## 2017-04-07 ENCOUNTER — Ambulatory Visit: Payer: BLUE CROSS/BLUE SHIELD

## 2017-04-11 ENCOUNTER — Ambulatory Visit (INDEPENDENT_AMBULATORY_CARE_PROVIDER_SITE_OTHER): Payer: BLUE CROSS/BLUE SHIELD | Admitting: Internal Medicine

## 2017-04-11 ENCOUNTER — Encounter: Payer: Self-pay | Admitting: Internal Medicine

## 2017-04-11 ENCOUNTER — Ambulatory Visit (INDEPENDENT_AMBULATORY_CARE_PROVIDER_SITE_OTHER): Payer: BLUE CROSS/BLUE SHIELD | Admitting: Pharmacist Clinician (PhC)/ Clinical Pharmacy Specialist

## 2017-04-11 DIAGNOSIS — Z23 Encounter for immunization: Secondary | ICD-10-CM | POA: Diagnosis not present

## 2017-04-11 DIAGNOSIS — B20 Human immunodeficiency virus [HIV] disease: Secondary | ICD-10-CM

## 2017-04-11 MED FILL — TIVICAY 50 MG TABLET: 50 | 30 days supply | Qty: 30 | Fill #1

## 2017-04-11 MED FILL — SYMTUZA 800-150-200-10 MG T: 800-150-200 | 30 days supply | Qty: 30 | Fill #1

## 2017-04-11 NOTE — Progress Notes (Signed)
[facility] for Infectious Disease  Patient Active Problem List   Diagnosis Date Noted  . HIV disease ([place]) 02/27/2015    Priority: High  . Cervical disc disorder with radiculopathy of cervical region 06/17/2016  . Parotid nodule 06/17/2016  . Abscess 12/25/2015  . Erectile dysfunction 09/09/2015  . Depression 04/01/2015  . Routine general medical examination at a health care facility 05/03/2014  . Lipoma of back 05/03/2014  . Tobacco abuse 05/03/2014      Medication List        Accurate as of 04/11/17  5:21 PM. Always use your most recent med list.          BC HEADACHE POWDER PO   cyclobenzaprine 5 MG tablet Commonly known as:  FLEXERIL Take 1 tablet (5 mg total) by mouth 3 (three) times daily as needed for muscle spasms.   Darunavir-Cobicisctat-Emtricitabine-Tenofovir Alafenamide 800-150-200-10 MG Tabs Commonly known as:  SYMTUZA Take 1 tablet by mouth daily with breakfast.   dolutegravir 50 MG tablet Commonly known as:  TIVICAY Take 1 tablet (50 mg total) by mouth daily.       Subjective: Ronald Castaneda is in for his routine HIV follow-up visit. He has had no problems obtaining, taking or tolerating his Symtuza or Tivicay.He has been following directions on the bottles and  Taking Tivicay in the morning and Symtuza in the afternoon He has not missed any doses.  Review of Systems: Review of Systems  Constitutional: Negative for chills, diaphoresis, fever, malaise/fatigue and weight loss.  HENT: Negative for sore throat.   Respiratory: Negative for cough, sputum production and shortness of breath.   Cardiovascular: Negative for chest pain.  Gastrointestinal: Negative for abdominal pain, diarrhea, heartburn, nausea and vomiting.  Genitourinary: Negative for dysuria and frequency.  Musculoskeletal: Negative for joint pain and myalgias.  Skin: Negative for rash.  Neurological: Negative for dizziness and headaches.  Psychiatric/Behavioral: Negative for  depression and substance abuse. The patient is not nervous/anxious.     Past Medical History:  Diagnosis Date  . Depression   . HIV infection ([place])   . Tobacco use     Social History   Tobacco Use  . Smoking status: Current Every Day Smoker    Packs/day: 0.50    Years: 44.00    Pack years: 22.00    Types: Cigarettes  . Smokeless tobacco: Never Used  . Tobacco comment: stressed at work, unable to decrease at this time  Substance Use Topics  . Alcohol use: Yes    Alcohol/week: 3.6 oz    Types: 6 Cans of beer per week    Comment: weekend drinker (3 -4 beers)  . Drug use: No    Family History  Problem Relation Age of Onset  . Hypertension Mother   . Healthy Sister   . Healthy Brother   . Diabetes type I Daughter     No Known Allergies  Objective: Vitals:   04/11/17 1629  BP: 140/81  Pulse: 69  Temp: 98.6 F (37 C)  TempSrc: Oral  Weight: 146 lb (66.2 kg)  Height: 5' 9.5" (1.765 m)   Body mass index is 21.25 kg/m.  Physical Exam  Constitutional: He is oriented to person, place, and time.  HENT:  Mouth/Throat: No oropharyngeal exudate.  Eyes: Conjunctivae are normal.  Cardiovascular: Normal rate and regular rhythm.  No murmur heard. Pulmonary/Chest: Effort normal and breath sounds normal.  Abdominal: Soft. He exhibits no mass. There is no tenderness.  Musculoskeletal: Normal range of motion.  Neurological: He is alert and oriented to person, place, and time.  Skin: No rash noted.  Psychiatric: Mood and affect normal.    Lab Results    Problem List Items Addressed This Visit      High   HIV disease ([place])    It sounds like he is off to a good start with his salvage, antiretroviral regimen. He will follow-up after lab work in 6 weeks.      Relevant Orders   T-helper cell (CD4)- ([facility] clinic only)   HIV 1 RNA quant-no reflex-bld   CBC   Comprehensive metabolic panel   Lipid panel   RPR       Ronald Bickers, MD [facility] for  [place] [phone] pager   [phone] cell 04/11/2017, 5:21 PM

## 2017-04-11 NOTE — Assessment & Plan Note (Signed)
It sounds like he is off to a good start with his salvage, antiretroviral regimen. He will follow-up after lab work in 6 weeks.

## 2017-04-12 NOTE — Progress Notes (Signed)
HPI: Ronald Castaneda is a 61 y.o. male who is here to see Dr. Megan Salon and pharmacy for his HIV adherence.   Allergies: No Known Allergies  Vitals: Temp: 98.6 F (37 C) (11/19 1629) Temp Source: Oral (11/19 1629) BP: 140/81 (11/19 1629) Pulse Rate: 69 (11/19 1629)  Past Medical History: Past Medical History:  Diagnosis Date  . Depression   . HIV infection ([place])   . Tobacco use     Social History: Social History   Socioeconomic History  . Marital status: Married    Spouse name: Not on file  . Number of children: 4  . Years of education: 73  . Highest education level: Not on file  Social Needs  . Financial resource strain: Not on file  . Food insecurity - worry: Not on file  . Food insecurity - inability: Not on file  . Transportation needs - medical: Not on file  . Transportation needs - non-medical: Not on file  Occupational History  . Not on file  Tobacco Use  . Smoking status: Current Every Day Smoker    Packs/day: 0.50    Years: 44.00    Pack years: 22.00    Types: Cigarettes  . Smokeless tobacco: Never Used  . Tobacco comment: stressed at work, unable to decrease at this time  Substance and Sexual Activity  . Alcohol use: Yes    Alcohol/week: 3.6 oz    Types: 6 Cans of beer per week    Comment: weekend drinker (3 -4 beers)  . Drug use: No  . Sexual activity: Not on file    Comment: given condoms  Other Topics Concern  . Not on file  Social History Narrative   Lives with wife in a one story home.  Has 4 children.  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  Education: high school.     Previous Regimen: Genvoya  Current Regimen: Symtuza/DTG  Labs: HIV 1 RNA Quant (copies/mL)  Date Value  03/01/2017 1,260 (H)  03/16/2016 <20  09/09/2015 <20   CD4 T Cell Abs (/uL)  Date Value  03/01/2017 420  03/16/2016 310 (L)  09/09/2015 430   Hep B S Ab (no units)  Date Value  03/11/2015 NEG   Hepatitis B  Surface Ag (no units)  Date Value  03/11/2015 NEGATIVE   HCV Ab (no units)  Date Value  03/11/2015 NEGATIVE    CrCl: CrCl cannot be calculated (Patient's most recent lab result is older than the maximum 21 days allowed.).  Lipids:    Component Value Date/Time   CHOL 161 03/01/2017 1459   TRIG 125 03/01/2017 1459   HDL 48 03/01/2017 1459   CHOLHDL 3.4 03/01/2017 1459   VLDL 38 (H) 03/11/2015 1534   LDLCALC 71 03/11/2015 1534    Assessment: Ronald Castaneda reportedly is doing well on his salvage regimen. He hasn't missed any doses. He has been taking one with breakfast and one with dinner. Counseled him on taking both at the same time with food since [place] will require a meal.   We will give him his second hep B shot today. Schedule him to come back for the third in March.   Recommendations:  Cont Symtuza/DTG F/u with VL in about 6 wks Second hep B today 3rd hep B in March  Janeth Terry, PharmD, BCPS, AAHIVP, CPP Clinical Infectious [place] for Infectious Disease 04/12/2017, 12:27 AM

## 2017-05-04 MED FILL — TIVICAY 50 MG TABLET: 50 | 30 days supply | Qty: 30 | Fill #2

## 2017-05-04 MED FILL — SYMTUZA 800-150-200-10 MG T: 800-150-200 | 30 days supply | Qty: 30 | Fill #2

## 2017-05-30 ENCOUNTER — Other Ambulatory Visit: Payer: BLUE CROSS/BLUE SHIELD

## 2017-05-30 DIAGNOSIS — B20 Human immunodeficiency virus [HIV] disease: Secondary | ICD-10-CM | POA: Diagnosis not present

## 2017-05-30 MED FILL — SYMTUZA 800-150-200-10 MG T: 800-150-200 | 30 days supply | Qty: 30 | Fill #3

## 2017-05-30 MED FILL — TIVICAY 50 MG TABLET: 50 | 30 days supply | Qty: 30 | Fill #3

## 2017-05-31 LAB — CBC
HEMATOCRIT: 47.2 % (ref 38.5–50.0)
HEMOGLOBIN: 15.8 g/dL (ref 13.2–17.1)
MCH: 28.1 pg (ref 27.0–33.0)
MCHC: 33.5 g/dL (ref 32.0–36.0)
MCV: 83.8 fL (ref 80.0–100.0)
MPV: 11 fL (ref 7.5–12.5)
Platelets: 280 10*3/uL (ref 140–400)
RBC: 5.63 10*6/uL (ref 4.20–5.80)
RDW: 15.4 % — ABNORMAL HIGH (ref 11.0–15.0)
WBC: 6.9 10*3/uL (ref 3.8–10.8)

## 2017-05-31 LAB — T-HELPER CELL (CD4) - (RCID CLINIC ONLY)
CD4 % Helper T Cell: 12 % — ABNORMAL LOW (ref 33–55)
CD4 T CELL ABS: 370 /uL — AB (ref 400–2700)

## 2017-05-31 LAB — LIPID PANEL
Cholesterol: 206 mg/dL — ABNORMAL HIGH (ref <200)
HDL: 47 mg/dL (ref >40)
LDL CHOLESTEROL (CALC): 118 mg/dL — AB
Non-HDL Cholesterol (Calc): 159 mg/dL (calc) — ABNORMAL HIGH (ref <130)
TRIGLYCERIDES: 286 mg/dL — AB (ref <150)
Total CHOL/HDL Ratio: 4.4 (calc) (ref <5.0)

## 2017-05-31 LAB — COMPREHENSIVE METABOLIC PANEL
AG Ratio: 1.1 (calc) (ref 1.0–2.5)
ALT: 10 U/L (ref 9–46)
AST: 16 U/L (ref 10–35)
Albumin: 3.8 g/dL (ref 3.6–5.1)
Alkaline phosphatase (APISO): 82 U/L (ref 40–115)
BUN/Creatinine Ratio: 15 (calc) (ref 6–22)
BUN: 23 mg/dL (ref 7–25)
CO2: 29 mmol/L (ref 20–32)
Calcium: 9.3 mg/dL (ref 8.6–10.3)
Chloride: 104 mmol/L (ref 98–110)
Creat: 1.55 mg/dL — ABNORMAL HIGH (ref 0.70–1.25)
Globulin: 3.5 g/dL (calc) (ref 1.9–3.7)
Glucose, Bld: 106 mg/dL — ABNORMAL HIGH (ref 65–99)
Potassium: 4.8 mmol/L (ref 3.5–5.3)
SODIUM: 137 mmol/L (ref 135–146)
TOTAL PROTEIN: 7.3 g/dL (ref 6.1–8.1)
Total Bilirubin: 0.5 mg/dL (ref 0.2–1.2)

## 2017-05-31 LAB — RPR: RPR Ser Ql: NONREACTIVE

## 2017-06-01 LAB — HIV-1 RNA QUANT-NO REFLEX-BLD
HIV 1 RNA Quant: <20 copies/mL — AB
HIV-1 RNA QUANT, LOG: DETECTED {Log_copies}/mL — AB

## 2017-06-03 ENCOUNTER — Encounter: Payer: Self-pay | Admitting: Internal Medicine

## 2017-06-03 ENCOUNTER — Ambulatory Visit (INDEPENDENT_AMBULATORY_CARE_PROVIDER_SITE_OTHER): Payer: BLUE CROSS/BLUE SHIELD | Admitting: Internal Medicine

## 2017-06-03 DIAGNOSIS — M5010 Cervical disc disorder with radiculopathy, unspecified cervical region: Secondary | ICD-10-CM

## 2017-06-03 NOTE — Progress Notes (Signed)
   Subjective:    Patient ID: Ronald Castaneda, male    DOB: 12-10-1955, 62 y.o.   MRN: [id number]  HPI The patient is a 62 YO man coming in for ongoing neck pain. He has had intermittent episodes at work depending on what work he is doing. He notices that when he lifts heavy things this happens more often. Denies injury. No numbness or weakness permanent in his arms. He still is unable to drive the forklift. He is using the muscle relaxers only when needed.   Review of Systems  Constitutional: Positive for activity change. Negative for appetite change, fatigue, fever and unexpected weight change.  Respiratory: Negative.   Cardiovascular: Negative.   Musculoskeletal: Positive for back pain, myalgias and neck pain. Negative for arthralgias.  Skin: Negative.   Neurological: Positive for numbness. Negative for syncope and weakness.      Objective:   Physical Exam  Constitutional: He is oriented to person, place, and time. He appears well-developed and well-nourished.  HENT:  Head: Normocephalic and atraumatic.  Eyes: EOM are normal.  Neck: Normal range of motion.  Cardiovascular: Normal rate and regular rhythm.  Pulmonary/Chest: Effort normal and breath sounds normal. No respiratory distress. He has no wheezes. He has no rales.  Abdominal: Soft. Bowel sounds are normal. He exhibits no distension. There is no tenderness. There is no rebound.  Musculoskeletal: He exhibits tenderness. He exhibits no edema.  Neurological: He is alert and oriented to person, place, and time. Coordination normal.  Skin: Skin is warm and dry.   Vitals:   06/03/17 0937  BP: 110/78  Pulse: 64  Temp: 98.2 F (36.8 C)  TempSrc: Oral  SpO2: 99%  Weight: 143 lb (64.9 kg)  Height: 5' 9.5" (1.765 m)      Assessment & Plan:

## 2017-06-03 NOTE — Assessment & Plan Note (Signed)
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 Refill flexeril as needed.

## 2017-06-03 NOTE — Patient Instructions (Signed)
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

## 2017-06-13 ENCOUNTER — Other Ambulatory Visit: Payer: Self-pay

## 2017-06-13 ENCOUNTER — Encounter: Payer: Self-pay | Admitting: Internal Medicine

## 2017-06-13 ENCOUNTER — Ambulatory Visit (INDEPENDENT_AMBULATORY_CARE_PROVIDER_SITE_OTHER): Payer: BLUE CROSS/BLUE SHIELD | Admitting: Internal Medicine

## 2017-06-13 DIAGNOSIS — B20 Human immunodeficiency virus [HIV] disease: Secondary | ICD-10-CM | POA: Diagnosis not present

## 2017-06-13 NOTE — Progress Notes (Signed)
Patient Active Problem List   Diagnosis Date Noted  . HIV disease ([place]) 02/27/2015    Priority: High  . Cervical disc disorder with radiculopathy of cervical region 06/17/2016  . Parotid nodule 06/17/2016  . Abscess 12/25/2015  . Erectile dysfunction 09/09/2015  . Depression 04/01/2015  . Routine general medical examination at a health care facility 05/03/2014  . Lipoma of back 05/03/2014  . Tobacco abuse 05/03/2014    Patient's Medications  New Prescriptions   No medications on file  Previous Medications   ASPIRIN-SALICYLAMIDE-CAFFEINE (BC HEADACHE POWDER PO)    Take 1 packet by mouth every 6 (six) hours as needed (pain). Reported on 07/01/2015   DARUNAVIR-COBICISCTAT-EMTRICITABINE-TENOFOVIR ALAFENAMIDE (SYMTUZA) 800-150-200-10 MG TABS    Take 1 tablet by mouth daily with breakfast.   DOLUTEGRAVIR (TIVICAY) 50 MG TABLET    Take 1 tablet (50 mg total) by mouth daily.  Modified Medications   No medications on file  Discontinued Medications   CYCLOBENZAPRINE (FLEXERIL) 5 MG TABLET    Take 1 tablet (5 mg total) by mouth 3 (three) times daily as needed for muscle spasms.    Subjective: Ronald Castaneda is in for his routine HIV follow-up visit.  He recently started on a new salvage regimen of Symtuza and Tivicay.  He does not have any problems tolerating them and denies missing a single dose.  He takes them around 6 AM each morning.  Review of Systems: Review of Systems  Constitutional: Negative for chills, diaphoresis and fever.  Gastrointestinal: Negative for abdominal pain, diarrhea, nausea and vomiting.  Psychiatric/Behavioral: Negative for depression. The patient is not nervous/anxious.     Past Medical History:  Diagnosis Date  . Depression   . HIV infection ([place])   . Tobacco use     Social History   Tobacco Use  . Smoking status: Current Every Day Smoker    Packs/day: 0.50    Years: 44.00    Pack years: 22.00    Types: Cigarettes  . Smokeless tobacco: Never  Used  . Tobacco comment: stressed at work, unable to decrease at this time  Substance Use Topics  . Alcohol use: Yes    Alcohol/week: 3.6 oz    Types: 6 Cans of beer per week    Comment: weekend drinker (3 -4 beers)  . Drug use: No    Family History  Problem Relation Age of Onset  . Hypertension Mother   . Healthy Sister   . Healthy Brother   . Diabetes type I Daughter     No Known Allergies  Health Maintenance  Topic Date Due  . TETANUS/TDAP  02/26/1975  . COLONOSCOPY  02/25/2006  . INFLUENZA VACCINE  08/22/2017 (Originally 12/22/2016)  . Hepatitis C Screening  Completed  . HIV Screening  Completed    Objective:  Vitals:   06/13/17 1455  BP: (!) 162/91  Pulse: 69  Temp: 98 F (36.7 C)  TempSrc: Oral  Weight: 145 lb (65.8 kg)  Height: 5\' 9"  (1.753 m)   Body mass index is 21.41 kg/m.  Physical Exam  Constitutional: He is oriented to person, place, and time.  He is in good spirits.  HENT:  Mouth/Throat: No oropharyngeal exudate.  Cardiovascular: Normal rate and regular rhythm.  No murmur heard. Pulmonary/Chest: Effort normal and breath sounds normal. He has no wheezes. He has no rales.  Abdominal: Soft. He exhibits no distension. There is no tenderness.  Neurological: He is alert and oriented to  person, place, and time.  Skin: No rash noted.  Psychiatric: Mood and affect normal.    Lab Results Lab Results  Component Value Date   WBC 6.9 05/30/2017   HGB 15.8 05/30/2017   HCT 47.2 05/30/2017   MCV 83.8 05/30/2017   PLT 280 05/30/2017    Lab Results  Component Value Date   CREATININE 1.55 (H) 05/30/2017   BUN 23 05/30/2017   NA 137 05/30/2017   K 4.8 05/30/2017   CL 104 05/30/2017   CO2 29 05/30/2017    Lab Results  Component Value Date   ALT 10 05/30/2017   AST 16 05/30/2017   ALKPHOS 95 09/09/2015   BILITOT 0.5 05/30/2017    Lab Results  Component Value Date   CHOL 206 (H) 05/30/2017   HDL 47 05/30/2017   LDLCALC 71 03/11/2015   TRIG  286 (H) 05/30/2017   CHOLHDL 4.4 05/30/2017   Lab Results  Component Value Date   LABRPR NON-REACTIVE 05/30/2017   HIV 1 RNA Quant (copies/mL)  Date Value  05/30/2017 <20 DETECTED (A)  03/01/2017 1,260 (H)  03/16/2016 <20   CD4 T Cell Abs (/uL)  Date Value  05/30/2017 370 (L)  03/01/2017 420  03/16/2016 310 (L)     Problem List Items Addressed This Visit      High   HIV disease ([place])    His infection has come back under excellent control on his current salvage regimen.  I talked to him again about the utmost importance of not missing doses.  He will follow-up after lab work in 3 months.      Relevant Orders   T-helper cell (CD4)- ([facility] clinic only)   HIV 1 RNA quant-no reflex-bld        Michel Bickers, MD [facility] for [facility] [place] [phone] pager   [phone] cell 06/13/2017, 3:16 PM

## 2017-06-13 NOTE — Assessment & Plan Note (Signed)
His infection has come back under excellent control on his current salvage regimen.  I talked to him again about the utmost importance of not missing doses.  He will follow-up after lab work in 3 months.

## 2017-06-24 MED FILL — SYMTUZA 800-150-200-10 MG T: 800-150-200 | 30 days supply | Qty: 30 | Fill #4

## 2017-06-24 MED FILL — TIVICAY 50 MG TABLET: 50 | 30 days supply | Qty: 30 | Fill #4

## 2017-07-21 MED FILL — SYMTUZA 800-150-200-10 MG T: 800-150-200 | 30 days supply | Qty: 30 | Fill #5

## 2017-07-21 MED FILL — TIVICAY 50 MG TABLET: 50 | 30 days supply | Qty: 30 | Fill #5

## 2017-08-02 ENCOUNTER — Ambulatory Visit (INDEPENDENT_AMBULATORY_CARE_PROVIDER_SITE_OTHER): Payer: BLUE CROSS/BLUE SHIELD | Admitting: Pharmacist Clinician (PhC)/ Clinical Pharmacy Specialist

## 2017-08-02 ENCOUNTER — Encounter: Payer: Self-pay | Admitting: Internal Medicine

## 2017-08-02 DIAGNOSIS — B20 Human immunodeficiency virus [HIV] disease: Secondary | ICD-10-CM

## 2017-08-02 DIAGNOSIS — Z23 Encounter for immunization: Secondary | ICD-10-CM | POA: Diagnosis not present

## 2017-08-02 NOTE — Progress Notes (Signed)
HPI: Ronald Castaneda is a 62 y.o. male who is here for his pharmacy visit for vaccines.  Allergies: No Known Allergies  Vitals:    Past Medical History: Past Medical History:  Diagnosis Date  . Depression   . HIV infection ([place])   . Tobacco use     Social History: Social History   Socioeconomic History  . Marital status: Married    Spouse name: Not on file  . Number of children: 4  . Years of education: 64  . Highest education level: Not on file  Social Needs  . Financial resource strain: Not on file  . Food insecurity - worry: Not on file  . Food insecurity - inability: Not on file  . Transportation needs - medical: Not on file  . Transportation needs - non-medical: Not on file  Occupational History  . Not on file  Tobacco Use  . Smoking status: Current Every Day Smoker    Packs/day: 0.50    Years: 44.00    Pack years: 22.00    Types: Cigarettes  . Smokeless tobacco: Never Used  . Tobacco comment: stressed at work, unable to decrease at this time  Substance and Sexual Activity  . Alcohol use: Yes    Alcohol/week: 3.6 oz    Types: 6 Cans of beer per week    Comment: weekend drinker (3 -4 beers)  . Drug use: No  . Sexual activity: Yes    Partners: Female    Birth control/protection: Condom    Comment: given condoms  Other Topics Concern  . Not on file  Social History Narrative   Lives with wife in a one story home.  Has 4 children.  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  Education: high school.     Previous Regimen: Genvoya  Current Regimen: Symtuza/DTG  Labs: HIV 1 RNA Quant (copies/mL)  Date Value  05/30/2017 <20 DETECTED (A)  03/01/2017 1,260 (H)  03/16/2016 <20   CD4 T Cell Abs (/uL)  Date Value  05/30/2017 370 (L)  03/01/2017 420  03/16/2016 310 (L)   Hep B S Ab (no units)  Date Value  03/11/2015 NEG   Hepatitis B Surface Ag (no units)  Date Value  03/11/2015 NEGATIVE   HCV Ab (no  units)  Date Value  03/11/2015 NEGATIVE    CrCl: CrCl cannot be calculated (Patient's most recent lab result is older than the maximum 21 days allowed.).  Lipids:    Component Value Date/Time   CHOL 206 (H) 05/30/2017 1101   TRIG 286 (H) 05/30/2017 1101   HDL 47 05/30/2017 1101   CHOLHDL 4.4 05/30/2017 1101   VLDL 38 (H) 03/11/2015 1534   LDLCALC 71 03/11/2015 1534   HIV Genotype Composite Data Genotype Dates:   Mutations in Bold impact drug susceptibility RT Mutations None  PI Mutations None  Integrase Mutations T66TI   Interpretation of Genotype Data per [employer] HIV Database Nucleoside RTIs     Non-Nucleoside RTIs     Protease Inhibitors     Integrase Inhibitors  bictegravir (BIC)   Susceptible dolutegravir (DTG) Susceptible elvitegravir (EVG) High-Level Resistance raltegravir (RAL)  Low-Level Resistance    Assessment: Ronald Castaneda is doing very well on his salvage regimen after a lapse in his adherence. He takes his meds daily now. His recent VL in Jan has become suppressed again. Congratulated him on his good adherence.   He got a new position at work now  that is less straining on his neck. He is here for his last hep A and B vaccine. He is going to come back in April for his labs visit before the appt with Dr. Megan Salon. Will add on his hepatitis titer at the lab visit.   Recommendations:  Continue Symtuza/Tivicay daily Hep A #2, hep B #3 Labs in April before Dr Gladys Damme, PharmD, BCPS, AAHIVP, CPP Clinical Infectious Disease [place] [facility] 08/02/2017, 10:48 AM

## 2017-08-15 MED FILL — TIVICAY 50 MG TABLET: 50 | 30 days supply | Qty: 30 | Fill #6

## 2017-08-15 MED FILL — SYMTUZA 800-150-200-10 MG T: 800-150-200 | 30 days supply | Qty: 30 | Fill #6

## 2017-09-06 ENCOUNTER — Other Ambulatory Visit: Payer: BLUE CROSS/BLUE SHIELD

## 2017-09-06 DIAGNOSIS — B20 Human immunodeficiency virus [HIV] disease: Secondary | ICD-10-CM

## 2017-09-07 LAB — HEPATITIS A ANTIBODY, TOTAL: HEPATITIS A AB,TOTAL: REACTIVE — AB

## 2017-09-07 LAB — T-HELPER CELL (CD4) - (RCID CLINIC ONLY)
CD4 % Helper T Cell: 12 % — ABNORMAL LOW (ref 33–55)
CD4 T Cell Abs: 290 /uL — ABNORMAL LOW (ref 400–2700)

## 2017-09-07 LAB — HEPATITIS B SURFACE ANTIBODY,QUALITATIVE: Hep B S Ab: REACTIVE — AB

## 2017-09-08 LAB — HIV-1 RNA QUANT-NO REFLEX-BLD
HIV 1 RNA Quant: <20 copies/mL
HIV-1 RNA Quant, Log: <1.3 Log copies/mL

## 2017-09-08 MED FILL — TIVICAY 50 MG TABLET: 50 | 30 days supply | Qty: 30 | Fill #7

## 2017-09-08 MED FILL — SYMTUZA 800-150-200-10 MG T: 800-150-200 | 30 days supply | Qty: 30 | Fill #7

## 2017-09-20 ENCOUNTER — Encounter: Payer: Self-pay | Admitting: Internal Medicine

## 2017-09-20 ENCOUNTER — Ambulatory Visit (INDEPENDENT_AMBULATORY_CARE_PROVIDER_SITE_OTHER): Payer: BLUE CROSS/BLUE SHIELD | Admitting: Internal Medicine

## 2017-09-20 DIAGNOSIS — Z720 Tobacco use: Secondary | ICD-10-CM

## 2017-09-20 DIAGNOSIS — F3342 Major depressive disorder, recurrent, in full remission: Secondary | ICD-10-CM

## 2017-09-20 DIAGNOSIS — B20 Human immunodeficiency virus [HIV] disease: Secondary | ICD-10-CM

## 2017-09-20 NOTE — Assessment & Plan Note (Signed)
His infection is come back under good control after starting his new salvage regimen several months ago.  He will follow-up after lab work in 6 months.

## 2017-09-20 NOTE — Assessment & Plan Note (Signed)
He denies current depression but is still dealing with a great deal of "stress" related to work.  I encouraged him to find constructive avenues to deal with his stress.

## 2017-09-20 NOTE — Progress Notes (Signed)
Patient Active Problem List   Diagnosis Date Noted  . HIV disease ([place]) 02/27/2015    Priority: High  . Cervical disc disorder with radiculopathy of cervical region 06/17/2016  . Parotid nodule 06/17/2016  . Abscess 12/25/2015  . Erectile dysfunction 09/09/2015  . Depression 04/01/2015  . Routine general medical examination at a health care facility 05/03/2014  . Lipoma of back 05/03/2014  . Tobacco abuse 05/03/2014    Patient's Medications  New Prescriptions   No medications on file  Previous Medications   ASPIRIN-SALICYLAMIDE-CAFFEINE (BC HEADACHE POWDER PO)    Take 1 packet by mouth every 6 (six) hours as needed (pain). Reported on 07/01/2015   DARUNAVIR-COBICISCTAT-EMTRICITABINE-TENOFOVIR ALAFENAMIDE (SYMTUZA) 800-150-200-10 MG TABS    Take 1 tablet by mouth daily with breakfast.   DOLUTEGRAVIR (TIVICAY) 50 MG TABLET    Take 1 tablet (50 mg total) by mouth daily.  Modified Medications   No medications on file  Discontinued Medications   No medications on file    Subjective: Ronald Castaneda is in for his routine HIV follow-up visit.  He has had no problems obtaining, taking her tolerating his new regimen of Symtuza and Tivicay.  He takes them each morning.  He does not recall missing any doses.  He continues to struggle with stress related to his work.  He says that he will often stop and smoke a cigarette to help him deal with his stress.  He does some yard work but does not get any regular exercise outside of work.  Review of Systems: Review of Systems  Constitutional: Negative for chills, diaphoresis, fever, malaise/fatigue and weight loss.  HENT: Negative for sore throat.   Respiratory: Negative for cough, sputum production and shortness of breath.   Cardiovascular: Negative for chest pain.  Gastrointestinal: Negative for abdominal pain, diarrhea, heartburn, nausea and vomiting.  Genitourinary: Negative for dysuria and frequency.  Musculoskeletal: Negative for joint  pain and myalgias.  Skin: Negative for rash.  Neurological: Negative for dizziness and headaches.  Psychiatric/Behavioral: Negative for depression and substance abuse. The patient is not nervous/anxious.        "Stress" as noted in HPI.    Past Medical History:  Diagnosis Date  . Depression   . HIV infection ([place])   . Tobacco use     Social History   Tobacco Use  . Smoking status: Current Every Day Smoker    Packs/day: 0.50    Years: 44.00    Pack years: 22.00    Types: Cigarettes  . Smokeless tobacco: Never Used  . Tobacco comment: stressed at work, unable to decrease at this time  Substance Use Topics  . Alcohol use: Yes    Alcohol/week: 3.6 oz    Types: 6 Cans of beer per week    Comment: weekend drinker (3 -4 beers)  . Drug use: No    Family History  Problem Relation Age of Onset  . Hypertension Mother   . Healthy Sister   . Healthy Brother   . Diabetes type I Daughter     No Known Allergies  Health Maintenance  Topic Date Due  . TETANUS/TDAP  02/26/1975  . COLONOSCOPY  02/25/2006  . INFLUENZA VACCINE  12/22/2017  . Hepatitis C Screening  Completed  . HIV Screening  Completed    Objective:  Vitals:   09/20/17 0833  BP: 138/85  Pulse: 68  Temp: 98.3 F (36.8 C)  TempSrc: Oral  Weight: 143 lb 8  oz (65.1 kg)  Height: 5\' 9"  (1.753 m)   Body mass index is 21.19 kg/m.  Physical Exam  Constitutional: He is oriented to person, place, and time.  He is pleasant and in no distress.  His weight is unchanged.  HENT:  Mouth/Throat: No oropharyngeal exudate.  Eyes: Conjunctivae are normal.  Cardiovascular: Normal rate and regular rhythm.  No murmur heard. Pulmonary/Chest: Effort normal and breath sounds normal.  Abdominal: Soft. He exhibits no mass. There is no tenderness.  Musculoskeletal: Normal range of motion.  Neurological: He is alert and oriented to person, place, and time.  Skin: No rash noted.    Lab Results Lab Results  Component Value  Date   WBC 6.9 05/30/2017   HGB 15.8 05/30/2017   HCT 47.2 05/30/2017   MCV 83.8 05/30/2017   PLT 280 05/30/2017    Lab Results  Component Value Date   CREATININE 1.55 (H) 05/30/2017   BUN 23 05/30/2017   NA 137 05/30/2017   K 4.8 05/30/2017   CL 104 05/30/2017   CO2 29 05/30/2017    Lab Results  Component Value Date   ALT 10 05/30/2017   AST 16 05/30/2017   ALKPHOS 95 09/09/2015   BILITOT 0.5 05/30/2017    Lab Results  Component Value Date   CHOL 206 (H) 05/30/2017   HDL 47 05/30/2017   LDLCALC 118 (H) 05/30/2017   TRIG 286 (H) 05/30/2017   CHOLHDL 4.4 05/30/2017   Lab Results  Component Value Date   LABRPR NON-REACTIVE 05/30/2017   HIV 1 RNA Quant (copies/mL)  Date Value  09/06/2017 <20 NOT DETECTED  05/30/2017 <20 DETECTED (A)  03/01/2017 1,260 (H)   CD4 T Cell Abs (/uL)  Date Value  09/06/2017 290 (L)  05/30/2017 370 (L)  03/01/2017 420     Problem List Items Addressed This Visit      High   HIV disease ([place])    His infection is come back under good control after starting his new salvage regimen several months ago.  He will follow-up after lab work in 6 months.      Relevant Orders   T-helper cell (CD4)- ([facility] clinic only)   HIV 1 RNA quant-no reflex-bld     Unprioritized   Depression    He denies current depression but is still dealing with a great deal of "stress" related to work.  I encouraged him to find constructive avenues to deal with his stress.      Tobacco abuse    I encouraged him again to consider cigarette cessation.           Michel Bickers, MD [facility] for Infect[facility] [place] [phone] pager   [phone] cell 09/20/2017, 8:51 AM

## 2017-09-20 NOTE — Assessment & Plan Note (Signed)
I encouraged him again to consider cigarette cessation.

## 2017-10-03 MED FILL — TIVICAY 50 MG TABLET: 50 | 30 days supply | Qty: 30 | Fill #8

## 2017-10-03 MED FILL — SYMTUZA 800-150-200-10 MG T: 800-150-200 | 30 days supply | Qty: 30 | Fill #8

## 2017-10-28 MED FILL — SYMTUZA 800-150-200-10 MG T: 800-150-200 | 30 days supply | Qty: 30 | Fill #9

## 2017-10-28 MED FILL — TIVICAY 50 MG TABLET: 50 | 30 days supply | Qty: 30 | Fill #9

## 2017-11-22 ENCOUNTER — Encounter: Payer: Self-pay | Admitting: Internal Medicine

## 2017-11-22 ENCOUNTER — Ambulatory Visit (INDEPENDENT_AMBULATORY_CARE_PROVIDER_SITE_OTHER): Payer: BLUE CROSS/BLUE SHIELD | Admitting: Internal Medicine

## 2017-11-22 VITALS — BP 120/82 | HR 65 | Temp 98.3°F | Ht 69.0 in | Wt 149.0 lb

## 2017-11-22 DIAGNOSIS — Z0000 Encounter for general adult medical examination without abnormal findings: Secondary | ICD-10-CM

## 2017-11-22 DIAGNOSIS — M5010 Cervical disc disorder with radiculopathy, unspecified cervical region: Secondary | ICD-10-CM

## 2017-11-22 DIAGNOSIS — F17210 Nicotine dependence, cigarettes, uncomplicated: Secondary | ICD-10-CM | POA: Diagnosis not present

## 2017-11-22 DIAGNOSIS — B20 Human immunodeficiency virus [HIV] disease: Secondary | ICD-10-CM | POA: Diagnosis not present

## 2017-11-22 DIAGNOSIS — Z720 Tobacco use: Secondary | ICD-10-CM

## 2017-11-22 MED FILL — TIVICAY 50 MG TABLET: 50 | 30 days supply | Qty: 30 | Fill #10

## 2017-11-22 MED FILL — SYMTUZA 800-150-200-10 MG T: 800-150-200 | 30 days supply | Qty: 30 | Fill #10

## 2017-11-22 NOTE — Progress Notes (Signed)
   Subjective:    Patient ID: Ronald Castaneda, male    DOB: 03-09-1956, 62 y.o.   MRN: [id number]  HPI The patient is a 62 YO man coming in for physical and evaluation of his neck. 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 This is where he has nerve impingement working on before. We talked about how this could return and there would be no guarantees that nerve changes would not be permanent.   PMH, [facility], social history reviewed and updated.   Review of Systems  Constitutional: Negative.   HENT: Negative.   Eyes: Negative.   Respiratory: Negative for cough, chest tightness and shortness of breath.   Cardiovascular: Negative for chest pain, palpitations and leg swelling.  Gastrointestinal: Negative for abdominal distention, abdominal pain, constipation, diarrhea, nausea and vomiting.  Musculoskeletal: Positive for neck pain. Negative for arthralgias, back pain, gait problem and neck stiffness.  Skin: Negative.   Neurological: Negative.   Psychiatric/Behavioral: Negative.       Objective:   Physical Exam  Constitutional: He is oriented to person, place, and time. He appears well-developed and well-nourished.  HENT:  Head: Normocephalic and atraumatic.  Eyes: EOM are normal.  Neck: Normal range of motion.  Cardiovascular: Normal rate and regular rhythm.  Pulmonary/Chest: Effort normal and breath sounds normal. No respiratory distress. He has no wheezes. He has no rales.  Abdominal: Soft. Bowel sounds are normal. He exhibits no distension. There is no tenderness. There is no rebound.  Musculoskeletal: He exhibits no edema or tenderness.  Neurological: He is alert and oriented to person, place, and time. Coordination normal.  Skin: Skin is warm and dry.  Psychiatric: He has a normal mood and affect.   Vitals:   11/22/17 0809  BP: 120/82  Pulse: 65  Temp: 98.3 F (36.8 C)  TempSrc: Oral  SpO2: 95%  Weight: 149 lb (67.6 kg)  Height: 5\' 9"  (1.753 m)      Assessment  & Plan:

## 2017-11-22 NOTE — Patient Instructions (Signed)
We have given you a new letter for the work.

## 2017-11-22 NOTE — Assessment & Plan Note (Signed)
Time spent counseling about tobacco usage: 3 minutes. I have asked about smoking and is smoking same as usual. The patient is advised to quit. The patient is not willing to quit. They would like to try to quit in the next 6 months. We will follow up with them in 6 months. 

## 2017-11-22 NOTE — Assessment & Plan Note (Signed)
Flu shot reminded yearly. Tetanus declined. Colonoscopy declined and declined cologuard. Counseled about sun safety and mole surveillance. Counseled about the dangers of distracted driving. Given 10 year screening recommendations. Counseled about need for smoking cessation.

## 2017-11-22 NOTE — Assessment & Plan Note (Signed)
Seeing ID and well managed. Taking symtuza and tivicay.

## 2017-11-22 NOTE — Assessment & Plan Note (Signed)
He does wish to try using forklift again. This is the activity that precipitated his symptoms in the past and we talked about the possibility that this could happen again and nerve pain or numbness could be permanent. Given work note to this affect.

## 2017-12-20 MED FILL — SYMTUZA 800-150-200-10 MG T: 800-150-200 | 30 days supply | Qty: 30 | Fill #11

## 2017-12-20 MED FILL — TIVICAY 50 MG TABLET: 50 | 30 days supply | Qty: 30 | Fill #11

## 2018-01-11 ENCOUNTER — Other Ambulatory Visit: Payer: Self-pay | Admitting: Internal Medicine

## 2018-01-11 DIAGNOSIS — B20 Human immunodeficiency virus [HIV] disease: Secondary | ICD-10-CM

## 2018-01-16 MED FILL — TIVICAY 50 MG TABLET: 50 | 30 days supply | Qty: 30 | Fill #0

## 2018-01-16 MED FILL — SYMTUZA 800-150-200-10 MG T: 800-150-200 | 30 days supply | Qty: 30 | Fill #0

## 2018-02-05 IMAGING — CT CT ANGIO HEAD
1 of 12 series · 5 of 33 positions shown · IV contrast (OMNI 350)
Comparison: None.

CLINICAL DATA: 60 y/o M; posterior neck pain and bilateral
weakness. Concern for dissection.

EXAM:
CT ANGIOGRAPHY HEAD AND NECK
TECHNIQUE: Multidetector CT imaging of the head and neck was performed using
the standard protocol during bolus administration of intravenous
contrast. Multiplanar CT image reconstructions and MIPs were
obtained to evaluate the vascular anatomy. Carotid stenosis
measurements (when applicable) are obtained utilizing NASCET
criteria, using the distal internal carotid diameter as the
denominator.
CONTRAST:  50 cc Isovue 370

[Series 11: cta neck axial · axial · 0.39mm/px · z∈[-308,-72]mm · 5 of 357 slices shown]
[im 60/357  soft-tissue]
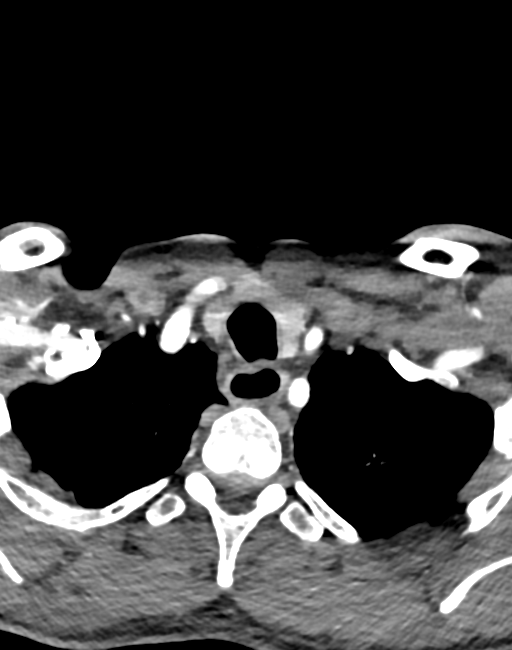
[im 119/357  bone]
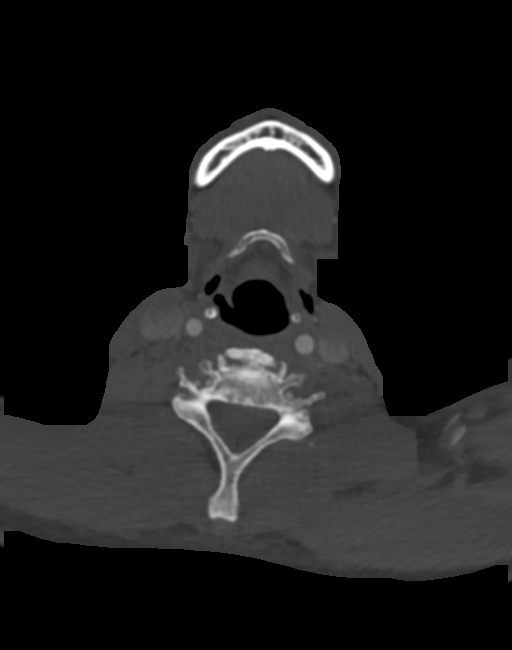
[im 179/357  soft-tissue]
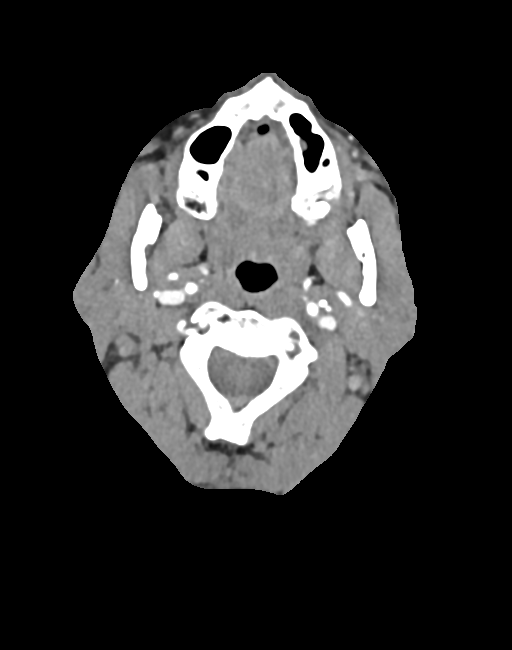
[im 238/357  bone]
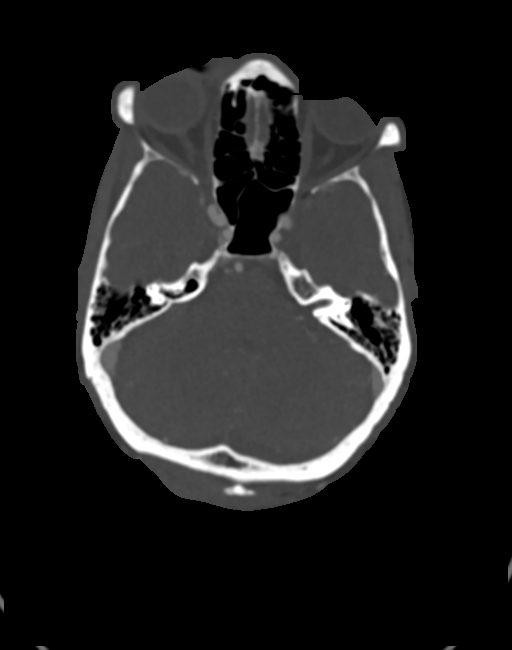
[im 297/357  soft-tissue]
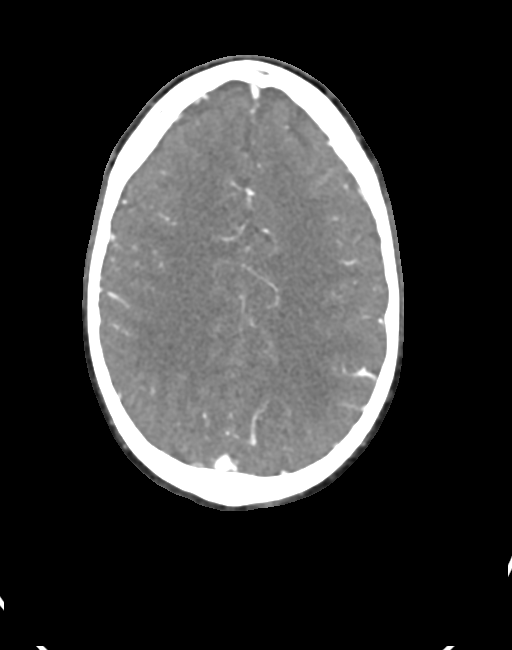

[5 of 33 positions shown; findings below may reference images not displayed]

FINDINGS: CT HEAD FINDINGS

Brain: No evidence of acute infarction, hemorrhage, hydrocephalus,
extra-axial collection or mass lesion/mass effect. Foci of
hypoattenuation within subcortical and periventricular white matter
are compatible with mild chronic microvascular ischemic changes.

Vascular: As below.

Skull: Normal. Negative for fracture or focal lesion.

Sinuses: Imaged portions are clear.

Orbits: Negative.

Review of the MIP images confirms the above findings

CTA NECK FINDINGS

Aortic arch: Bovine arch, normal variant. Mild aortic
atherosclerosis with calcifications.

Right carotid system: No evidence of dissection, stenosis (50% or
greater) or occlusion. Minimal calcified plaque of the carotid
bifurcation.

Left carotid system: No evidence of dissection, stenosis (50% or
greater) or occlusion.

Vertebral arteries: Codominant. No evidence of dissection, stenosis
(50% or greater) or occlusion.

Skeleton: No acute osseous abnormality is identified. Moderate
cervical spondylosis with predominantly discogenic degenerative
changes from the C4 through C7 levels.

Other neck: Mildly patulous upper mediastinal esophagus. Normal
thyroid gland. Patent aerodigestive tract. Irregular hypoattenuating
lesion within the left parotid gland measuring 25 x 16 x 19 mm with
uncertain enhancement given arterial contrast bolus timing (series
5, image 79 and series 13, image 63). Soft tissue thickening of the
dermis and subcutaneous fat in the suboccipital region of the scalp.

Upper chest: Mild to moderate centrilobular emphysema of lung
apices.

Review of the MIP images confirms the above findings

CTA HEAD FINDINGS

Anterior circulation: No significant stenosis, proximal occlusion,
aneurysm, or vascular malformation.

Posterior circulation: No significant stenosis, proximal occlusion,
aneurysm, or vascular malformation.

Venous sinuses: As permitted by contrast timing, patent.

Anatomic variants: No definite anterior communicating artery. Patent
bilateral posterior communicating arteries. Fetal left PCA with
diminutive left P1 segment.

Delayed phase: No abnormal intracranial enhancement.

Review of the MIP images confirms the above findings
IMPRESSION: 1. No acute intracranial abnormality identified.
2. Mild chronic microvascular ischemic changes of the brain.
3. No evidence of dissection, hemodynamically significant stenosis,
or occlusion of the carotid and vertebral arteries of the neck.
4. No significant stenosis, proximal occlusion, aneurysm, or
vascular malformation of circle of Willis.
5. Irregular hypoattenuating lesion within the left parotid gland
measuring 25 x 16 x 19 mm with uncertain enhancement given arterial
contrast bolus timing (series 5, image 79 and series 13, image 63).
Differential includes parotid cystic lesion or neoplasm. Further
characterization with soft tissue contrast CT or MRI of neck is
recommended.
6. Soft tissue thickening of the dermis and subcutaneous fat in the
suboccipital region of the scalp. Question cellulitis or
hidradenitis. Clinical correlation recommended.

By: Spata Bass M.D.

## 2018-02-10 MED FILL — SYMTUZA 800-150-200-10 MG T: 800-150-200 | 30 days supply | Qty: 30 | Fill #1

## 2018-02-10 MED FILL — TIVICAY 50 MG TABLET: 50 | 30 days supply | Qty: 30 | Fill #1

## 2018-03-08 ENCOUNTER — Other Ambulatory Visit: Payer: BLUE CROSS/BLUE SHIELD

## 2018-03-08 DIAGNOSIS — B20 Human immunodeficiency virus [HIV] disease: Secondary | ICD-10-CM

## 2018-03-08 MED FILL — TIVICAY 50 MG TABLET: 50 | 30 days supply | Qty: 30 | Fill #2

## 2018-03-08 MED FILL — SYMTUZA 800-150-200-10 MG T: 800-150-200 | 30 days supply | Qty: 30 | Fill #2

## 2018-03-09 LAB — T-HELPER CELL (CD4) - (RCID CLINIC ONLY)
CD4 % Helper T Cell: 12 % — ABNORMAL LOW (ref 33–55)
CD4 T CELL ABS: 290 /uL — AB (ref 400–2700)

## 2018-03-11 LAB — HIV-1 RNA QUANT-NO REFLEX-BLD
HIV 1 RNA QUANT: NOT DETECTED {copies}/mL
HIV-1 RNA QUANT, LOG: NOT DETECTED {Log_copies}/mL

## 2018-03-22 ENCOUNTER — Ambulatory Visit (INDEPENDENT_AMBULATORY_CARE_PROVIDER_SITE_OTHER): Payer: BLUE CROSS/BLUE SHIELD | Admitting: Internal Medicine

## 2018-03-22 ENCOUNTER — Encounter: Payer: Self-pay | Admitting: Internal Medicine

## 2018-03-22 DIAGNOSIS — B20 Human immunodeficiency virus [HIV] disease: Secondary | ICD-10-CM | POA: Diagnosis not present

## 2018-03-22 NOTE — Assessment & Plan Note (Signed)
His infection is come under excellent control since starting his salvage regimen 1 year ago.  He refused influenza vaccine today.  He will follow-up after lab work in 6 months

## 2018-03-22 NOTE — Progress Notes (Signed)
Patient Active Problem List   Diagnosis Date Noted  . HIV disease ([place]) 02/27/2015    Priority: High  . Cervical disc disorder with radiculopathy of cervical region 06/17/2016  . Parotid nodule 06/17/2016  . Erectile dysfunction 09/09/2015  . Depression 04/01/2015  . Routine general medical examination at a health care facility 05/03/2014  . Lipoma of back 05/03/2014  . Tobacco abuse 05/03/2014    Patient's Medications  New Prescriptions   No medications on file  Previous Medications   ASPIRIN-SALICYLAMIDE-CAFFEINE (BC HEADACHE POWDER PO)    Take 1 packet by mouth every 6 (six) hours as needed (pain). Reported on 07/01/2015   SYMTUZA 800-150-200-10 MG TABS    TAKE 1 TABLET BY MOUTH DAILY WITH BREAKFAST.   TIVICAY 50 MG TABLET    TAKE 1 TABLET (50 MG TOTAL) BY MOUTH DAILY.  Modified Medications   No medications on file  Discontinued Medications   No medications on file    Subjective: Ronald Castaneda is in for his routine HIV follow-up visit.  He has had no problems obtaining, taking or tolerating his symptoms of been to the cavity.  He does not recall missing any doses.  He is now working third shift which he finds far less stressful than first shift.  He takes his medication at work around 7 AM.  He has no current plans to quit smoking cigarettes but says that he and his wife have been talking about trying to quit.  Review of Systems: Review of Systems  Constitutional: Negative for chills, diaphoresis and fever.  Respiratory: Positive for cough. Negative for sputum production and shortness of breath.   Cardiovascular: Negative for chest pain.  Gastrointestinal: Negative for abdominal pain, diarrhea, nausea and vomiting.  Psychiatric/Behavioral: Negative for depression. The patient is not nervous/anxious.     Past Medical History:  Diagnosis Date  . Depression   . HIV infection ([place])   . Tobacco use     Social History   Tobacco Use  . Smoking status: Current Every  Day Smoker    Packs/day: 0.50    Years: 44.00    Pack years: 22.00    Types: Cigarettes  . Smokeless tobacco: Never Used  . Tobacco comment: stressed at work, unable to decrease at this time  Substance Use Topics  . Alcohol use: Yes    Alcohol/week: 6.0 standard drinks    Types: 6 Cans of beer per week    Comment: weekend drinker (3 -4 beers)  . Drug use: No    Family History  Problem Relation Age of Onset  . Hypertension Mother   . Healthy Sister   . Healthy Brother   . Diabetes type I Daughter     No Known Allergies  Health Maintenance  Topic Date Due  . COLONOSCOPY  02/25/2006  . INFLUENZA VACCINE  12/22/2017  . TETANUS/TDAP  11/23/2018 (Originally 02/26/1975)  . Hepatitis C Screening  Completed  . HIV Screening  Completed    Objective:  Vitals:   03/22/18 1347  BP: 136/82  Pulse: 71  Temp: 98.7 F (37.1 C)  TempSrc: Oral  Weight: 151 lb (68.5 kg)   Body mass index is 22.3 kg/m.  Physical Exam  Constitutional: He is oriented to person, place, and time.  He is in good spirits.  HENT:  Mouth/Throat: No oropharyngeal exudate.  Cardiovascular: Normal rate, regular rhythm and normal heart sounds.  Pulmonary/Chest: Effort normal and breath sounds normal.  Abdominal: Soft.  There is no tenderness.  Neurological: He is alert and oriented to person, place, and time.  Skin: No rash noted.  Psychiatric: He has a normal mood and affect.    Lab Results Lab Results  Component Value Date   WBC 6.9 05/30/2017   HGB 15.8 05/30/2017   HCT 47.2 05/30/2017   MCV 83.8 05/30/2017   PLT 280 05/30/2017    Lab Results  Component Value Date   CREATININE 1.55 (H) 05/30/2017   BUN 23 05/30/2017   NA 137 05/30/2017   K 4.8 05/30/2017   CL 104 05/30/2017   CO2 29 05/30/2017    Lab Results  Component Value Date   ALT 10 05/30/2017   AST 16 05/30/2017   ALKPHOS 95 09/09/2015   BILITOT 0.5 05/30/2017    Lab Results  Component Value Date   CHOL 206 (H)  05/30/2017   HDL 47 05/30/2017   LDLCALC 118 (H) 05/30/2017   TRIG 286 (H) 05/30/2017   CHOLHDL 4.4 05/30/2017   Lab Results  Component Value Date   LABRPR NON-REACTIVE 05/30/2017   HIV 1 RNA Quant (copies/mL)  Date Value  03/08/2018 <20 NOT DETECTED  09/06/2017 <20 NOT DETECTED  05/30/2017 <20 DETECTED (A)   CD4 T Cell Abs (/uL)  Date Value  03/08/2018 290 (L)  09/06/2017 290 (L)  05/30/2017 370 (L)     Problem List Items Addressed This Visit      High   HIV disease ([place])    His infection is come under excellent control since starting his salvage regimen 1 year ago.  He refused influenza vaccine today.  He will follow-up after lab work in 6 months      Relevant Orders   T-helper cell (CD4)- ([facility] clinic only)   HIV-1 RNA quant-no reflex-bld   CBC   Comprehensive metabolic panel   Lipid panel   RPR        Michel Bickers, MD [facility] for [place] 336 [phone] pager   [phone] cell 03/22/2018, 2:05 PM

## 2018-04-04 MED FILL — TIVICAY 50 MG TABLET: 50 | 30 days supply | Qty: 30 | Fill #3

## 2018-04-04 MED FILL — SYMTUZA 800-150-200-10 MG T: 800-150-200 | 30 days supply | Qty: 30 | Fill #3

## 2018-04-24 ENCOUNTER — Encounter (HOSPITAL_COMMUNITY): Payer: Self-pay | Admitting: Emergency Medicine

## 2018-04-24 ENCOUNTER — Ambulatory Visit (HOSPITAL_COMMUNITY)
Admission: EM | Admit: 2018-04-24 | Discharge: 2018-04-24 | Disposition: A | Discharge status: Home / Self Care | Payer: BLUE CROSS/BLUE SHIELD | Attending: Internal Medicine | Admitting: Internal Medicine

## 2018-04-24 DIAGNOSIS — M79652 Pain in left thigh: Secondary | ICD-10-CM | POA: Diagnosis not present

## 2018-04-24 DIAGNOSIS — F17210 Nicotine dependence, cigarettes, uncomplicated: Secondary | ICD-10-CM | POA: Insufficient documentation

## 2018-04-24 DIAGNOSIS — N454 Abscess of epididymis or testis: Secondary | ICD-10-CM

## 2018-04-24 DIAGNOSIS — B20 Human immunodeficiency virus [HIV] disease: Secondary | ICD-10-CM

## 2018-04-24 MED ORDER — SULFAMETHOXAZOLE-TRIMETHOPRIM 800-160 MG PO TABS: ORAL_TABLET | ORAL | 0 refills | Status: DC

## 2018-04-24 MED ORDER — LIDOCAINE HCL (PF) 1 % IJ SOLN
INTRAMUSCULAR | Status: AC
  Filled 2018-04-24: qty 2

## 2018-04-24 MED ORDER — CEFTRIAXONE SODIUM 1 G IJ SOLR
INTRAMUSCULAR | Status: AC
  Filled 2018-04-24: qty 10

## 2018-04-24 MED ORDER — CEFTRIAXONE SODIUM 1 G IJ SOLR
1.0000 g | Freq: Once | INTRAMUSCULAR | Status: AC
  Administered 2018-04-24: 1 g via INTRAMUSCULAR

## 2018-04-24 NOTE — ED Provider Notes (Signed)
[place]    CSN: [id number] Arrival date & time: 04/24/18  1036     History   Chief Complaint Chief Complaint  Patient presents with  . Leg Pain    HPI Ronald Castaneda is a 62 y.o. male.   Who present due to having slight L thigh pain 11/25 and 2 days later the pain got worse and moved to his L groin/ scrotum area and has some oozing matter draining from it scrotum region. His L thigh does not bother him as much. He has been working a lot of hours last month and thought is was just due to this. Had an abscess many years ago. Has been having night sweats the past couple of night. Has not taken his temp.      Past Medical History:  Diagnosis Date  . Depression   . HIV infection ([place])   . Tobacco use     Patient Active Problem List   Diagnosis Date Noted  . Cervical disc disorder with radiculopathy of cervical region 06/17/2016  . Parotid nodule 06/17/2016  . Erectile dysfunction 09/09/2015  . Depression 04/01/2015  . HIV disease ([place]) 02/27/2015  . Routine general medical examination at a health care facility 05/03/2014  . Lipoma of back 05/03/2014  . Tobacco abuse 05/03/2014    Past Surgical History:  Procedure Laterality Date  . None         Home Medications    Prior to Admission medications   Medication Sig Start Date End Date Taking? Authorizing Provider  Aspirin-Salicylamide-Caffeine (BC HEADACHE POWDER PO) Take 1 packet by mouth every 6 (six) hours as needed (pain). Reported on 07/01/2015    [provider]  SYMTUZA 800-150-200-10 MG TABS TAKE 1 TABLET BY MOUTH DAILY WITH BREAKFAST. 01/11/18   Michel Bickers, MD  TIVICAY 50 MG tablet TAKE 1 TABLET (50 MG TOTAL) BY MOUTH DAILY. 01/11/18   Michel Bickers, MD    Family History Family History  Problem Relation Age of Onset  . Hypertension Mother   . Healthy Sister   . Healthy Brother   . Diabetes type I Daughter     Social History Social History   Tobacco Use  . Smoking status:  Current Every Day Smoker    Packs/day: 0.50    Years: 44.00    Pack years: 22.00    Types: Cigarettes  . Smokeless tobacco: Never Used  . Tobacco comment: stressed at work, unable to decrease at this time  Substance Use Topics  . Alcohol use: Yes    Alcohol/week: 6.0 standard drinks    Types: 6 Cans of beer per week    Comment: weekend drinker (3 -4 beers)  . Drug use: No     Allergies   Patient has no known allergies.   Review of Systems Review of Systems  Constitutional: Positive for diaphoresis. Negative for chills and fever.  Gastrointestinal: Negative for abdominal pain, nausea and vomiting.  Genitourinary: Positive for scrotal swelling. Negative for discharge, dysuria, penile pain and testicular pain.  Musculoskeletal: Positive for myalgias. Negative for back pain and joint swelling.       L thigh  Skin: Positive for wound. Negative for color change and rash.  Neurological: Negative for weakness and numbness.  LYMPH- has shotty nodes on L inguinal region   Physical Exam Triage Vital Signs ED Triage Vitals [04/24/18 1135]  Enc Vitals Group     BP 116/81     Pulse Rate 80     Resp  18     Temp 98.3 F (36.8 C)     Temp Source Oral     SpO2 100 %     Weight      Height      Head Circumference      Peak Flow      Pain Score 8     Pain Loc      Pain Edu?      Excl. in [place]?    No data found.  Updated Vital Signs BP 116/81 (BP Location: Left Arm)   Pulse 80   Temp 98.3 F (36.8 C) (Oral)   Resp 18   SpO2 100%   Visual Acuity Right Eye Distance:   Left Eye Distance:   Bilateral Distance:    Right Eye Near:   Left Eye Near:    Bilateral Near:     Physical Exam  Constitutional: He is oriented to person, place, and time. He appears well-developed and well-nourished. No distress.  HENT:  Head: Normocephalic.  Right Ear: External ear normal.  Left Ear: External ear normal.  Nose: Nose normal.  Eyes: Conjunctivae are normal. No scleral icterus.    Neck: Neck supple.  Pulmonary/Chest: Effort normal.  Genitourinary: Penis normal.  Genitourinary Comments: L posterior inner scrotal sack with 1/2 cm openning where there is mild serosanguineous malodorous puss draining. I massaged this area til I drained all of it and seemed it was possible 60 mls. Pt tolerated this fine. But Still has a soft, fluctuant fullness on the bottom of the scrotum sack of about 2.3 cm, which was not indurated, red or hard, but was a little tender. I massaged this, but no more puss was expelled. Area cleansed and 4x4 applied between thigh and scrotum.   Musculoskeletal: Normal range of motion. He exhibits no edema, tenderness or deformity.  Of L thigh.   Neurological: He is alert and oriented to person, place, and time.  Skin: Skin is warm and dry. He is not diaphoretic.  Psychiatric: He has a normal mood and affect. His behavior is normal. Judgment and thought content normal.  Nursing note and vitals reviewed.  UC Treatments / Results  Labs (all labs ordered are listed, but only abnormal results are displayed) Labs Reviewed - No data to display  EKG None  Radiology No results found.  Procedures Since he already had an open area from there the pus was draining, I did not have to open the area.  I obtained a wound culture to  Sent out.  Medications Ordered in UC Medications - No data to display  Initial Impression / Assessment and Plan / UC Course  I have reviewed the triage vital signs and the nursing notes. He was given Rocephin 1G IM and placed on oral Bactrim DS 2 bid x 10 days. The smell of the puss smelled like MRSA.  He was advised to do sits baths 2- 4 times a day. Also advised to use heating pad on this area as noted in instructions.  Use a heating pad on area and needs to Fu with PCP or urologist in 2 days.  If he gets worse needs to be seen again.      Final Clinical Impressions(s) / UC Diagnoses   Final diagnoses:  None   Discharge  Instructions   None    ED Prescriptions    None     Controlled Substance Prescriptions [place] Controlled Substance Registry consulted?    Shelby Mattocks, PA-C 04/24/18 1354

## 2018-04-24 NOTE — Discharge Instructions (Addendum)
Sit and soak in warm water in the tub for 20 minutes 2-4 times a day. Place gauze to catch the drainage.  Follow up with urology this week and if they cant see you, then see your family Dr    Take Tylenol 500 mg 2 every 6 hours for pain.  Apply heat on area for 15 minutes 2-4 times a day.

## 2018-04-24 NOTE — ED Triage Notes (Signed)
Pt sts left leg pain into left testicle

## 2018-04-26 LAB — AEROBIC CULTURE W GRAM STAIN (SUPERFICIAL SPECIMEN)

## 2018-04-26 LAB — AEROBIC CULTURE  (SUPERFICIAL SPECIMEN): CULTURE: NORMAL

## 2018-04-27 MED FILL — SYMTUZA 800-150-200-10 MG T: 800-150-200 | 30 days supply | Qty: 30 | Fill #4

## 2018-04-27 MED FILL — TIVICAY 50 MG TABLET: 50 | 30 days supply | Qty: 30 | Fill #4

## 2018-05-22 MED FILL — SYMTUZA 800-150-200-10 MG T: 800-150-200 | 30 days supply | Qty: 30 | Fill #5

## 2018-05-22 MED FILL — TIVICAY 50 MG TABLET: 50 | 30 days supply | Qty: 30 | Fill #5

## 2018-06-06 ENCOUNTER — Ambulatory Visit (INDEPENDENT_AMBULATORY_CARE_PROVIDER_SITE_OTHER): Payer: BLUE CROSS/BLUE SHIELD | Admitting: Internal Medicine

## 2018-06-06 ENCOUNTER — Encounter: Payer: Self-pay | Admitting: Internal Medicine

## 2018-06-06 DIAGNOSIS — M5010 Cervical disc disorder with radiculopathy, unspecified cervical region: Secondary | ICD-10-CM

## 2018-06-06 MED ORDER — MELOXICAM 15 MG PO TABS: 15.0000 mg | ORAL_TABLET | Freq: Every day | ORAL | 0 refills | Status: DC

## 2018-06-06 NOTE — Patient Instructions (Signed)
We have sent in meloxicam to take 1 pill daily to help the neck get better.   You can also take tylenol with this.   If you are not better in 1-2 weeks let us know.   You can use heating pad also to help with pain.

## 2018-06-06 NOTE — Progress Notes (Signed)
   Subjective:   Patient ID: Ronald Castaneda, male    DOB: Apr 29, 1956, 63 y.o.   MRN: [id number]  HPI The patient is a 63 YO man coming in for upper back and neck pain. History of neck arthritis and pain problems. Denies known injury or overuse. Did have onset last night with pain 8/10 in the neck and upper back. Took 2 BC powders and had some relief. There was mild tingling associated in the hands. Did not progress to numbness in arms as he has had before. Today the pain is still there. Denies fevers or chills. Denies feeling of ripping or tearing.   Review of Systems  Constitutional: Positive for activity change. Negative for appetite change, fatigue, fever and unexpected weight change.  Respiratory: Negative.   Cardiovascular: Negative.   Musculoskeletal: Positive for back pain, myalgias and neck pain. Negative for arthralgias and neck stiffness.  Skin: Negative.   Neurological: Negative for syncope, weakness and numbness.       Tingling hands    Objective:  Physical Exam Constitutional:      Appearance: He is well-developed.  HENT:     Head: Normocephalic and atraumatic.  Neck:     Musculoskeletal: Normal range of motion.  Cardiovascular:     Rate and Rhythm: Normal rate and regular rhythm.  Pulmonary:     Effort: Pulmonary effort is normal. No respiratory distress.     Breath sounds: Normal breath sounds. No wheezing or rales.  Abdominal:     General: Bowel sounds are normal. There is no distension.     Palpations: Abdomen is soft.     Tenderness: There is no abdominal tenderness. There is no rebound.  Musculoskeletal:        General: Tenderness present.     Comments: Pain left paraspinal cervical region and scapular left region.   Skin:    General: Skin is warm and dry.  Neurological:     Mental Status: He is alert and oriented to person, place, and time.     Coordination: Coordination normal.     Vitals:   06/06/18 1559  BP: 120/84  Pulse: 64  Temp: 98.7 F (37.1  C)  TempSrc: Oral  SpO2: 96%  Weight: 149 lb (67.6 kg)  Height: 5\' 9"  (1.753 m)    Assessment & Plan:

## 2018-06-06 NOTE — Assessment & Plan Note (Signed)
Flare today, declines depo-medrol and toradol injection. Prefers to try oral medication and rx for meloxicam. If no improvement in 1-2 weeks will get x-ray c-spine. Without red flag signs today.

## 2018-06-13 ENCOUNTER — Other Ambulatory Visit: Payer: Self-pay | Admitting: Internal Medicine

## 2018-06-13 DIAGNOSIS — B20 Human immunodeficiency virus [HIV] disease: Secondary | ICD-10-CM

## 2018-06-14 ENCOUNTER — Other Ambulatory Visit: Payer: Self-pay | Admitting: Pharmacist

## 2018-06-14 ENCOUNTER — Telehealth: Payer: Self-pay | Admitting: *Deleted

## 2018-06-14 ENCOUNTER — Other Ambulatory Visit: Payer: Self-pay | Admitting: Internal Medicine

## 2018-06-14 DIAGNOSIS — B20 Human immunodeficiency virus [HIV] disease: Secondary | ICD-10-CM

## 2018-06-14 MED ORDER — DOLUTEGRAVIR SODIUM 50 MG PO TABS: 50.0000 mg | ORAL_TABLET | Freq: Every day | ORAL | 4 refills | Status: DC

## 2018-06-14 MED FILL — SYMTUZA 800-150-200-10 MG T: 800-150-200 | 30 days supply | Qty: 30 | Fill #0

## 2018-06-14 MED FILL — TIVICAY 50 MG TABLET: 50 | 30 days supply | Qty: 30 | Fill #0

## 2018-06-14 NOTE — Progress Notes (Signed)
Ronald Castaneda accidentally deleted the prescription. Will send a new Rx for Tivicay.

## 2018-06-14 NOTE — Telephone Encounter (Signed)
[place] needed reorder of [phone].  Done by Cassie. Landis Gandy, RN

## 2018-07-10 MED FILL — SYMTUZA 800-150-200-10 MG T: 800-150-200 | 30 days supply | Qty: 30 | Fill #1

## 2018-07-10 MED FILL — TIVICAY 50 MG TABLET: 50 | 30 days supply | Qty: 30 | Fill #1

## 2018-08-04 MED FILL — SYMTUZA 800-150-200-10 MG T: 800-150-200 | 30 days supply | Qty: 30 | Fill #2

## 2018-08-04 MED FILL — TIVICAY 50 MG TABLET: 50 | 30 days supply | Qty: 30 | Fill #2

## 2018-08-26 MED FILL — SYMTUZA 800-150-200-10 MG T: 800-150-200 | 30 days supply | Qty: 30 | Fill #3

## 2018-08-26 MED FILL — TIVICAY 50 MG TABLET: 50 | 30 days supply | Qty: 30 | Fill #3

## 2018-09-20 MED FILL — SYMTUZA 800-150-200-10 MG T: 800-150-200 | 30 days supply | Qty: 30 | Fill #4

## 2018-09-20 MED FILL — TIVICAY 50 MG TABLET: 50 | 30 days supply | Qty: 30 | Fill #4

## 2018-09-21 ENCOUNTER — Other Ambulatory Visit: Payer: BLUE CROSS/BLUE SHIELD

## 2018-10-05 ENCOUNTER — Encounter: Payer: BLUE CROSS/BLUE SHIELD | Admitting: Internal Medicine

## 2018-10-11 ENCOUNTER — Other Ambulatory Visit: Payer: Self-pay | Admitting: Internal Medicine

## 2018-10-11 DIAGNOSIS — B20 Human immunodeficiency virus [HIV] disease: Secondary | ICD-10-CM

## 2018-10-12 MED FILL — TIVICAY 50 MG TABLET: 50 | 30 days supply | Qty: 30 | Fill #0

## 2018-10-12 MED FILL — SYMTUZA 800-150-200-10 MG T: 800-150-200 | 30 days supply | Qty: 30 | Fill #0

## 2018-11-06 ENCOUNTER — Other Ambulatory Visit: Payer: Self-pay | Admitting: Internal Medicine

## 2018-11-06 DIAGNOSIS — B20 Human immunodeficiency virus [HIV] disease: Secondary | ICD-10-CM

## 2018-11-07 MED FILL — SYMTUZA 800-150-200-10 MG T: 800-150-200 | 30 days supply | Qty: 30 | Fill #0

## 2018-11-07 MED FILL — TIVICAY 50 MG TABLET: 50 | 30 days supply | Qty: 30 | Fill #0

## 2018-11-29 ENCOUNTER — Other Ambulatory Visit: Payer: Self-pay | Admitting: Internal Medicine

## 2018-11-29 DIAGNOSIS — B20 Human immunodeficiency virus [HIV] disease: Secondary | ICD-10-CM

## 2018-12-04 MED FILL — SYMTUZA 800-150-200-10 MG T: 800-150-200 | 30 days supply | Qty: 30 | Fill #0

## 2018-12-04 MED FILL — TIVICAY 50 MG TABLET: 50 | 30 days supply | Qty: 30 | Fill #0

## 2018-12-06 ENCOUNTER — Other Ambulatory Visit: Payer: Self-pay

## 2018-12-06 ENCOUNTER — Other Ambulatory Visit: Payer: BC Managed Care – PPO

## 2018-12-06 DIAGNOSIS — B20 Human immunodeficiency virus [HIV] disease: Secondary | ICD-10-CM | POA: Diagnosis not present

## 2018-12-06 DIAGNOSIS — Z79899 Other long term (current) drug therapy: Secondary | ICD-10-CM | POA: Diagnosis not present

## 2018-12-07 ENCOUNTER — Other Ambulatory Visit: Payer: BLUE CROSS/BLUE SHIELD

## 2018-12-07 LAB — T-HELPER CELL (CD4) - (RCID CLINIC ONLY)
CD4 % Helper T Cell: 22 % — ABNORMAL LOW (ref 33–65)
CD4 T Cell Abs: 443 /uL (ref 400–1790)

## 2018-12-09 LAB — CBC
HCT: 42.7 % (ref 38.5–50.0)
Hemoglobin: 14.4 g/dL (ref 13.2–17.1)
MCH: 29.3 pg (ref 27.0–33.0)
MCHC: 33.7 g/dL (ref 32.0–36.0)
MCV: 87 fL (ref 80.0–100.0)
MPV: 10.9 fL (ref 7.5–12.5)
Platelets: 261 10*3/uL (ref 140–400)
RBC: 4.91 10*6/uL (ref 4.20–5.80)
RDW: 15 % (ref 11.0–15.0)
WBC: 6.7 10*3/uL (ref 3.8–10.8)

## 2018-12-09 LAB — COMPREHENSIVE METABOLIC PANEL
AG Ratio: 1.3 (calc) (ref 1.0–2.5)
ALT: 12 U/L (ref 9–46)
AST: 16 U/L (ref 10–35)
Albumin: 3.9 g/dL (ref 3.6–5.1)
Alkaline phosphatase (APISO): 81 U/L (ref 35–144)
BUN: 17 mg/dL (ref 7–25)
CO2: 26 mmol/L (ref 20–32)
Calcium: 9.2 mg/dL (ref 8.6–10.3)
Chloride: 108 mmol/L (ref 98–110)
Creat: 1.23 mg/dL (ref 0.70–1.25)
Globulin: 3.1 g/dL (calc) (ref 1.9–3.7)
Glucose, Bld: 98 mg/dL (ref 65–99)
Potassium: 4.5 mmol/L (ref 3.5–5.3)
Sodium: 141 mmol/L (ref 135–146)
Total Bilirubin: 0.3 mg/dL (ref 0.2–1.2)
Total Protein: 7 g/dL (ref 6.1–8.1)

## 2018-12-09 LAB — LIPID PANEL
Cholesterol: 203 mg/dL — ABNORMAL HIGH (ref <200)
HDL: 54 mg/dL (ref >=40)
LDL Cholesterol (Calc): 130 mg/dL (calc) — ABNORMAL HIGH
Non-HDL Cholesterol (Calc): 149 mg/dL (calc) — ABNORMAL HIGH (ref <130)
Total CHOL/HDL Ratio: 3.8 (calc) (ref <5.0)
Triglycerides: 93 mg/dL (ref <150)

## 2018-12-09 LAB — RPR: RPR Ser Ql: NONREACTIVE

## 2018-12-09 LAB — HIV-1 RNA QUANT-NO REFLEX-BLD
HIV 1 RNA Quant: <20 copies/mL — AB
HIV-1 RNA Quant, Log: <1.3 Log copies/mL — AB

## 2018-12-21 ENCOUNTER — Encounter: Payer: Self-pay | Admitting: Internal Medicine

## 2018-12-21 ENCOUNTER — Other Ambulatory Visit: Payer: Self-pay

## 2018-12-21 ENCOUNTER — Ambulatory Visit: Payer: BC Managed Care – PPO | Admitting: Internal Medicine

## 2018-12-21 DIAGNOSIS — B20 Human immunodeficiency virus [HIV] disease: Secondary | ICD-10-CM

## 2018-12-21 DIAGNOSIS — Z720 Tobacco use: Secondary | ICD-10-CM

## 2018-12-21 NOTE — Progress Notes (Signed)
Patient Active Problem List   Diagnosis Date Noted  . HIV disease ([place]) 02/27/2015    Priority: High  . Cervical disc disorder with radiculopathy of cervical region 06/17/2016  . Parotid nodule 06/17/2016  . Erectile dysfunction 09/09/2015  . Depression 04/01/2015  . Routine general medical examination at a health care facility 05/03/2014  . Lipoma of back 05/03/2014  . Tobacco abuse 05/03/2014    Patient's Medications  New Prescriptions   No medications on file  Previous Medications   ASPIRIN-SALICYLAMIDE-CAFFEINE (BC HEADACHE POWDER PO)    Take 1 packet by mouth every 6 (six) hours as needed (pain). Reported on 07/01/2015   MELOXICAM ([facility]B[place]) 15 MG TABLET    Take 1 tablet (15 mg total) by mouth daily.   SYMTUZA 800-150-200-10 MG TABS    TAKE 1 TABLET BY MOUTH DAILY WITH BREAKFAST. PLEASE SCHEDULE VISIT FOR FURTHER FILLS.   TIVICAY 50 MG TABLET    TAKE 1 TABLET (50 MG TOTAL) BY MOUTH DAILY. PLEASE SCHEDULE OFFICE VISIT FOR FURTHER FILLS  Modified Medications   No medications on file  Discontinued Medications   No medications on file    Subjective: Ronald Castaneda is in for his routine HIV follow-up visit.  He has had no problems obtaining, taking or tolerating his Tivicay or Symtuza.  He takes it each morning when he gets off of work.  He feels safe at work.  Everyone is wearing a mask and gloves and they wipe down surfaces every 2 hours.  He is feeling well.  He continues to smoke half pack of cigarettes daily and has no current plans to quit.  Review of Systems: Review of Systems  Constitutional: Negative for fever.  Respiratory: Negative for cough and shortness of breath.   Cardiovascular: Negative for chest pain.  Gastrointestinal: Negative for diarrhea, nausea and vomiting.  Psychiatric/Behavioral: Negative for depression.    Past Medical History:  Diagnosis Date  . Depression   . HIV infection ([place])   . Tobacco use     Social History   Tobacco Use  . Smoking  status: Current Every Day Smoker    Packs/day: 0.50    Years: 44.00    Pack years: 22.00    Types: Cigarettes  . Smokeless tobacco: Never Used  . Tobacco comment: stressed at work, unable to decrease at this time  Substance Use Topics  . Alcohol use: Yes    Alcohol/week: 6.0 standard drinks    Types: 6 Cans of beer per week    Comment: weekend drinker (3 -4 beers)  . Drug use: No    Family History  Problem Relation Age of Onset  . Hypertension Mother   . Healthy Sister   . Healthy Brother   . Diabetes type I Daughter     No Known Allergies  Health Maintenance  Topic Date Due  . TETANUS/TDAP  02/26/1975  . COLONOSCOPY  02/25/2006  . INFLUENZA VACCINE  12/23/2018  . Hepatitis C Screening  Completed  . HIV Screening  Completed    Objective:  Vitals:   12/21/18 0855  BP: 137/87  Pulse: 76  Temp: 98 F (36.7 C)   There is no height or weight on file to calculate BMI.  Physical Exam Constitutional:      Comments: He is in good spirits.  Cardiovascular:     Rate and Rhythm: Normal rate and regular rhythm.     Heart sounds: No murmur.  Pulmonary:  Effort: Pulmonary effort is normal.     Breath sounds: Normal breath sounds.  Abdominal:     General: There is no distension.  Psychiatric:        Mood and Affect: Mood normal.     Lab Results Lab Results  Component Value Date   WBC 6.7 12/06/2018   HGB 14.4 12/06/2018   HCT 42.7 12/06/2018   MCV 87.0 12/06/2018   PLT 261 12/06/2018    Lab Results  Component Value Date   CREATININE 1.23 12/06/2018   BUN 17 12/06/2018   NA 141 12/06/2018   K 4.5 12/06/2018   CL 108 12/06/2018   CO2 26 12/06/2018    Lab Results  Component Value Date   ALT 12 12/06/2018   AST 16 12/06/2018   ALKPHOS 95 09/09/2015   BILITOT 0.3 12/06/2018    Lab Results  Component Value Date   CHOL 203 (H) 12/06/2018   HDL 54 12/06/2018   LDLCALC 130 (H) 12/06/2018   TRIG 93 12/06/2018   CHOLHDL 3.8 12/06/2018   Lab  Results  Component Value Date   LABRPR NON-REACTIVE 12/06/2018   HIV 1 RNA Quant (copies/mL)  Date Value  12/06/2018 <20 DETECTED (A)  03/08/2018 <20 NOT DETECTED  09/06/2017 <20 NOT DETECTED   CD4 T Cell Abs (/uL)  Date Value  12/06/2018 443  03/08/2018 290 (L)  09/06/2017 290 (L)     Problem List Items Addressed This Visit      High   HIV disease ([place])    His adherence is much improved over the past few years and his infection is under excellent, long-term control.  He will continue his current regimen and follow-up after lab work in 6 months.      Relevant Orders   T-helper cell (CD4)- ([facility] clinic only)   HIV-1 RNA quant-no reflex-bld   CBC   Comprehensive metabolic panel   Lipid panel   RPR     Unprioritized   Tobacco abuse    Asked him to consider cutting down and setting a quit date.           Michel Bickers, MD [facility] [place] [facility] [phone] pager   [phone] cell 12/21/2018, 9:19 AM

## 2018-12-21 NOTE — Assessment & Plan Note (Signed)
His adherence is much improved over the past few years and his infection is under excellent, long-term control.  He will continue his current regimen and follow-up after lab work in 6 months.

## 2018-12-21 NOTE — Assessment & Plan Note (Signed)
Asked him to consider cutting down and setting a quit date.

## 2018-12-28 ENCOUNTER — Other Ambulatory Visit: Payer: Self-pay | Admitting: Internal Medicine

## 2018-12-28 DIAGNOSIS — B20 Human immunodeficiency virus [HIV] disease: Secondary | ICD-10-CM

## 2019-01-01 MED FILL — TIVICAY 50 MG TABLET: 50 | 30 days supply | Qty: 30 | Fill #0

## 2019-01-01 MED FILL — SYMTUZA 800-150-200-10 MG T: 800-150-200 | 30 days supply | Qty: 30 | Fill #0

## 2019-01-25 MED FILL — TIVICAY 50 MG TABLET: 50 | 30 days supply | Qty: 30 | Fill #1

## 2019-01-25 MED FILL — SYMTUZA 800-150-200-10 MG T: 800-150-200 | 30 days supply | Qty: 30 | Fill #1

## 2019-02-20 MED FILL — TIVICAY 50 MG TABLET: 50 | 30 days supply | Qty: 30 | Fill #2

## 2019-02-20 MED FILL — SYMTUZA 800-150-200-10 MG T: 800-150-200 | 30 days supply | Qty: 30 | Fill #2

## 2019-03-21 MED FILL — SYMTUZA 800-150-200-10 MG T: 800-150-200 | 30 days supply | Qty: 30 | Fill #3

## 2019-03-21 MED FILL — TIVICAY 50 MG TABLET: 50 | 30 days supply | Qty: 30 | Fill #3

## 2019-04-18 MED FILL — SYMTUZA 800-150-200-10 MG T: 800-150-200 | 30 days supply | Qty: 30 | Fill #4

## 2019-04-18 MED FILL — TIVICAY 50 MG TABLET: 50 | 30 days supply | Qty: 30 | Fill #4

## 2019-05-14 MED FILL — TIVICAY 50 MG TABLET: 50 | 30 days supply | Qty: 30 | Fill #5

## 2019-05-14 MED FILL — SYMTUZA 800-150-200-10 MG T: 800-150-200 | 30 days supply | Qty: 30 | Fill #5

## 2019-06-07 ENCOUNTER — Other Ambulatory Visit: Payer: BC Managed Care – PPO

## 2019-06-07 ENCOUNTER — Other Ambulatory Visit: Payer: Self-pay

## 2019-06-07 DIAGNOSIS — B20 Human immunodeficiency virus [HIV] disease: Secondary | ICD-10-CM

## 2019-06-08 LAB — T-HELPER CELL (CD4) - (RCID CLINIC ONLY)
CD4 % Helper T Cell: 21 % — ABNORMAL LOW (ref 33–65)
CD4 T Cell Abs: 473 /uL (ref 400–1790)

## 2019-06-13 LAB — COMPREHENSIVE METABOLIC PANEL
AG Ratio: 1.2 (calc) (ref 1.0–2.5)
ALT: 13 U/L (ref 9–46)
AST: 18 U/L (ref 10–35)
Albumin: 3.9 g/dL (ref 3.6–5.1)
Alkaline phosphatase (APISO): 90 U/L (ref 35–144)
BUN: 18 mg/dL (ref 7–25)
CO2: 27 mmol/L (ref 20–32)
Calcium: 9 mg/dL (ref 8.6–10.3)
Chloride: 106 mmol/L (ref 98–110)
Creat: 1.06 mg/dL (ref 0.70–1.25)
Globulin: 3.3 g/dL (calc) (ref 1.9–3.7)
Glucose, Bld: 73 mg/dL (ref 65–99)
Potassium: 4 mmol/L (ref 3.5–5.3)
Sodium: 141 mmol/L (ref 135–146)
Total Bilirubin: 0.3 mg/dL (ref 0.2–1.2)
Total Protein: 7.2 g/dL (ref 6.1–8.1)

## 2019-06-13 LAB — CBC
HCT: 43.9 % (ref 38.5–50.0)
Hemoglobin: 14.8 g/dL (ref 13.2–17.1)
MCH: 29.8 pg (ref 27.0–33.0)
MCHC: 33.7 g/dL (ref 32.0–36.0)
MCV: 88.3 fL (ref 80.0–100.0)
MPV: 11.2 fL (ref 7.5–12.5)
Platelets: 268 10*3/uL (ref 140–400)
RBC: 4.97 10*6/uL (ref 4.20–5.80)
RDW: 14.2 % (ref 11.0–15.0)
WBC: 9.2 10*3/uL (ref 3.8–10.8)

## 2019-06-13 LAB — HIV-1 RNA QUANT-NO REFLEX-BLD
HIV 1 RNA Quant: <20 copies/mL
HIV-1 RNA Quant, Log: <1.3 Log copies/mL

## 2019-06-13 LAB — LIPID PANEL
Cholesterol: 203 mg/dL — ABNORMAL HIGH (ref <200)
HDL: 49 mg/dL (ref >=40)
LDL Cholesterol (Calc): 130 mg/dL (calc) — ABNORMAL HIGH
Non-HDL Cholesterol (Calc): 154 mg/dL (calc) — ABNORMAL HIGH (ref <130)
Total CHOL/HDL Ratio: 4.1 (calc) (ref <5.0)
Triglycerides: 127 mg/dL (ref <150)

## 2019-06-13 LAB — RPR: RPR Ser Ql: NONREACTIVE

## 2019-06-15 ENCOUNTER — Other Ambulatory Visit: Payer: Self-pay

## 2019-06-15 DIAGNOSIS — B20 Human immunodeficiency virus [HIV] disease: Secondary | ICD-10-CM

## 2019-06-15 MED ORDER — SYMTUZA 800-150-200-10 MG PO TABS: 1.0000 | ORAL_TABLET | Freq: Every day | ORAL | 5 refills | Status: DC

## 2019-06-15 MED ORDER — TIVICAY 50 MG PO TABS: ORAL_TABLET | ORAL | 5 refills | Status: DC

## 2019-06-20 MED FILL — TIVICAY 50 MG TABLET: 50 | 30 days supply | Qty: 30 | Fill #0

## 2019-06-20 MED FILL — SYMTUZA 800-150-200-10 MG T: 800-150-200 | 30 days supply | Qty: 30 | Fill #0

## 2019-06-21 ENCOUNTER — Encounter: Payer: BC Managed Care – PPO | Admitting: Internal Medicine

## 2019-06-22 ENCOUNTER — Telehealth: Payer: Self-pay

## 2019-06-22 NOTE — Telephone Encounter (Signed)
COVID-19 Pre-Screening Questions:  Do you currently have a fever (>100 F), chills or unexplained body aches?N  Are you currently experiencing new cough, shortness of breath, sore throat, runny nose? N  .  Have you recently travelled outside the state of [place] in the last 14 days? N .  Have you been in contact with someone that is currently pending confirmation of Covid19 testing or has been confirmed to have the [place] virus?  N  **If the patient answers NO to ALL questions -  advise the patient to please call the clinic before coming to the office should any symptoms develop.    1.

## 2019-06-25 ENCOUNTER — Other Ambulatory Visit: Payer: Self-pay

## 2019-06-25 ENCOUNTER — Ambulatory Visit: Payer: BC Managed Care – PPO | Admitting: Internal Medicine

## 2019-06-25 ENCOUNTER — Encounter: Payer: Self-pay | Admitting: Internal Medicine

## 2019-06-25 DIAGNOSIS — B20 Human immunodeficiency virus [HIV] disease: Secondary | ICD-10-CM

## 2019-06-25 NOTE — Progress Notes (Signed)
Patient Active Problem List   Diagnosis Date Noted  . HIV disease ([place]) 02/27/2015    Priority: High  . Cervical disc disorder with radiculopathy of cervical region 06/17/2016  . Parotid nodule 06/17/2016  . Erectile dysfunction 09/09/2015  . Depression 04/01/2015  . Routine general medical examination at a health care facility 05/03/2014  . Lipoma of back 05/03/2014  . Tobacco abuse 05/03/2014    Patient's Medications  New Prescriptions   No medications on file  Previous Medications   ASPIRIN-SALICYLAMIDE-CAFFEINE (BC HEADACHE POWDER PO)    Take 1 packet by mouth every 6 (six) hours as needed (pain). Reported on 07/01/2015   DARUNAVIR-COBICISCTAT-EMTRICITABINE-TENOFOVIR ALAFENAMIDE (SYMTUZA) 800-150-200-10 MG TABS    Take 1 tablet by mouth daily with breakfast.   DOLUTEGRAVIR (TIVICAY) 50 MG TABLET    TAKE 1 TABLET (50 MG TOTAL) BY MOUTH DAILY.   MELOXICAM (MOBIC) 15 MG TABLET    Take 1 tablet (15 mg total) by mouth daily.  Modified Medications   No medications on file  Discontinued Medications   No medications on file    Subjective: Ronald Castaneda is in for his routine HIV follow-up visit.  He has not had any problems obtaining, taking or tolerating his Symtuza or Tivicay.  He takes it each morning when he gets off of work.  He has not missed any doses.  He says that all of his coworkers are wearing a mask and a deep cleaning of the workplace is done every 2 hours.  He has not taken the influenza vaccine and refuses it today.  He is not planning on taking the Covid vaccine.  Review of Systems: Review of Systems  Constitutional: Negative for chills, diaphoresis and fever.  Respiratory: Negative for cough and shortness of breath.   Cardiovascular: Negative for chest pain.  Gastrointestinal: Negative for abdominal pain, diarrhea, nausea and vomiting.    Past Medical History:  Diagnosis Date  . Depression   . HIV infection ([place])   . Tobacco use     Social History    Tobacco Use  . Smoking status: Current Every Day Smoker    Packs/day: 0.50    Years: 44.00    Pack years: 22.00    Types: Cigarettes  . Smokeless tobacco: Never Used  . Tobacco comment: stressed at work, unable to decrease at this time  Substance Use Topics  . Alcohol use: Yes    Alcohol/week: 6.0 standard drinks    Types: 6 Cans of beer per week    Comment: weekend drinker (3 -4 beers)  . Drug use: No    Family History  Problem Relation Age of Onset  . Hypertension Mother   . Healthy Sister   . Healthy Brother   . Diabetes type I Daughter     No Known Allergies  Health Maintenance  Topic Date Due  . TETANUS/TDAP  02/26/1975  . COLONOSCOPY  02/25/2006  . INFLUENZA VACCINE  12/23/2018  . Hepatitis C Screening  Completed  . HIV Screening  Completed    Objective:  Vitals:   06/25/19 1019  BP: 137/82  Pulse: 73  Temp: 98.5 F (36.9 C)  TempSrc: Oral  Weight: 153 lb (69.4 kg)   Body mass index is 22.59 kg/m.  Physical Exam Constitutional:      Comments: He is in good spirits.  Cardiovascular:     Rate and Rhythm: Normal rate and regular rhythm.     Heart sounds: No murmur.  Pulmonary:     Effort: Pulmonary effort is normal.     Breath sounds: Normal breath sounds.  Abdominal:     Palpations: Abdomen is soft.     Tenderness: There is no abdominal tenderness.  Musculoskeletal:        General: No swelling or tenderness.  Skin:    Findings: No rash.  Neurological:     General: No focal deficit present.  Psychiatric:        Mood and Affect: Mood normal.     Lab Results Lab Results  Component Value Date   WBC 9.2 06/07/2019   HGB 14.8 06/07/2019   HCT 43.9 06/07/2019   MCV 88.3 06/07/2019   PLT 268 06/07/2019    Lab Results  Component Value Date   CREATININE 1.06 06/07/2019   BUN 18 06/07/2019   NA 141 06/07/2019   K 4.0 06/07/2019   CL 106 06/07/2019   CO2 27 06/07/2019    Lab Results  Component Value Date   ALT 13 06/07/2019   AST  18 06/07/2019   ALKPHOS 95 09/09/2015   BILITOT 0.3 06/07/2019    Lab Results  Component Value Date   CHOL 203 (H) 06/07/2019   HDL 49 06/07/2019   LDLCALC 130 (H) 06/07/2019   TRIG 127 06/07/2019   CHOLHDL 4.1 06/07/2019   Lab Results  Component Value Date   LABRPR NON-REACTIVE 06/07/2019   HIV 1 RNA Quant (copies/mL)  Date Value  06/07/2019 <20 NOT DETECTED  12/06/2018 <20 DETECTED (A)  03/08/2018 <20 NOT DETECTED   CD4 T Cell Abs (/uL)  Date Value  06/07/2019 473  12/06/2018 443  03/08/2018 290 (L)     Problem List Items Addressed This Visit      High   HIV disease ([place])    His infection remains under excellent, long-term control.  He will continue his current antiretroviral regimen and follow-up after lab work in 6 months.  I did tell him that I recommend both the influenza and Covid vaccines.      Relevant Orders   T-helper cell (CD4)- ([facility] clinic only)   HIV-1 RNA quant-no reflex-bld        Michel Bickers, MD [facility] for [place] [phone] pager   [phone] cell 06/25/2019, 10:33 AM

## 2019-06-25 NOTE — Assessment & Plan Note (Signed)
His infection remains under excellent, long-term control.  He will continue his current antiretroviral regimen and follow-up after lab work in 6 months.  I did tell him that I recommend both the influenza and Covid vaccines.

## 2019-07-17 MED FILL — SYMTUZA 800-150-200-10 MG T: 800-150-200 | 30 days supply | Qty: 30 | Fill #1

## 2019-07-17 MED FILL — TIVICAY 50 MG TABLET: 50 | 30 days supply | Qty: 30 | Fill #1

## 2019-08-09 MED FILL — TIVICAY 50 MG TABLET: 50 | 30 days supply | Qty: 30 | Fill #2

## 2019-08-09 MED FILL — SYMTUZA 800-150-200-10 MG T: 800-150-200 | 30 days supply | Qty: 30 | Fill #2

## 2019-08-27 DIAGNOSIS — H1032 Unspecified acute conjunctivitis, left eye: Secondary | ICD-10-CM | POA: Diagnosis not present

## 2019-09-06 MED FILL — SYMTUZA 800-150-200-10 MG T: 800-150-200 | 30 days supply | Qty: 30 | Fill #3

## 2019-09-06 MED FILL — TIVICAY 50 MG TABLET: 50 | 30 days supply | Qty: 30 | Fill #3

## 2019-11-21 ENCOUNTER — Other Ambulatory Visit: Payer: Self-pay | Admitting: Internal Medicine

## 2019-11-21 DIAGNOSIS — B20 Human immunodeficiency virus [HIV] disease: Secondary | ICD-10-CM

## 2019-11-26 MED FILL — SYMTUZA 800-150-200-10 MG T: 800-150-200 | 30 days supply | Qty: 30 | Fill #0

## 2019-11-26 MED FILL — TIVICAY 50 MG TABLET: 50 | 30 days supply | Qty: 30 | Fill #0

## 2019-12-20 MED FILL — TIVICAY 50 MG TABLET: 50 | 30 days supply | Qty: 30 | Fill #1

## 2019-12-20 MED FILL — SYMTUZA 800-150-200-10 MG T: 800-150-200 | 30 days supply | Qty: 30 | Fill #1

## 2020-01-17 MED FILL — SYMTUZA 800-150-200-10 MG T: 800-150-200 | 30 days supply | Qty: 30 | Fill #2

## 2020-01-17 MED FILL — TIVICAY 50 MG TABLET: 50 | 30 days supply | Qty: 30 | Fill #2

## 2020-02-11 MED FILL — TIVICAY 50 MG TABLET: 50 | 30 days supply | Qty: 30 | Fill #3

## 2020-02-11 MED FILL — SYMTUZA 800-150-200-10 MG T: 800-150-200 | 30 days supply | Qty: 30 | Fill #3

## 2020-03-05 ENCOUNTER — Other Ambulatory Visit: Payer: Self-pay | Admitting: Internal Medicine

## 2020-03-05 DIAGNOSIS — B20 Human immunodeficiency virus [HIV] disease: Secondary | ICD-10-CM

## 2020-03-05 MED FILL — TIVICAY 50 MG TABLET: 50 | 30 days supply | Qty: 30 | Fill #0

## 2020-03-05 MED FILL — SYMTUZA 800-150-200-10 MG T: 800-150-200 | 30 days supply | Qty: 30 | Fill #0

## 2020-03-26 ENCOUNTER — Encounter: Payer: Self-pay | Admitting: Internal Medicine

## 2020-03-26 ENCOUNTER — Other Ambulatory Visit: Payer: Self-pay

## 2020-03-26 ENCOUNTER — Other Ambulatory Visit: Payer: Self-pay | Admitting: Internal Medicine

## 2020-03-26 ENCOUNTER — Ambulatory Visit (INDEPENDENT_AMBULATORY_CARE_PROVIDER_SITE_OTHER): Payer: BC Managed Care – PPO | Admitting: Internal Medicine

## 2020-03-26 DIAGNOSIS — B20 Human immunodeficiency virus [HIV] disease: Secondary | ICD-10-CM | POA: Diagnosis not present

## 2020-03-26 DIAGNOSIS — Z720 Tobacco use: Secondary | ICD-10-CM

## 2020-03-26 DIAGNOSIS — F3342 Major depressive disorder, recurrent, in full remission: Secondary | ICD-10-CM | POA: Diagnosis not present

## 2020-03-26 MED ORDER — TIVICAY 50 MG PO TABS: 50.0000 mg | ORAL_TABLET | Freq: Every day | ORAL | 11 refills | Status: DC

## 2020-03-26 MED ORDER — SYMTUZA 800-150-200-10 MG PO TABS: 1.0000 | ORAL_TABLET | Freq: Every day | ORAL | 11 refills | Status: DC

## 2020-03-26 NOTE — Assessment & Plan Note (Signed)
That he probably has early COPD causing some dyspnea on her shin and encouraged him to consider quitting cigarettes.

## 2020-03-26 NOTE — Assessment & Plan Note (Signed)
His infection remains under excellent, long-term control.  He will get repeat blood work today, continue his current regimen and follow-up in 6 months.  I did encourage him to start the Covid vaccines.

## 2020-03-26 NOTE — Progress Notes (Signed)
Patient Active Problem List   Diagnosis Date Noted  . HIV disease ([place]) 02/27/2015    Priority: High  . Cervical disc disorder with radiculopathy of cervical region 06/17/2016  . Parotid nodule 06/17/2016  . Erectile dysfunction 09/09/2015  . Depression 04/01/2015  . Routine general medical examination at a health care facility 05/03/2014  . Lipoma of back 05/03/2014  . Tobacco abuse 05/03/2014    Patient's Medications  New Prescriptions   No medications on file  Previous Medications   ASPIRIN-SALICYLAMIDE-CAFFEINE (BC HEADACHE POWDER PO)    Take 1 packet by mouth every 6 (six) hours as needed (pain). Reported on 07/01/2015  Modified Medications   Modified Medication Previous Medication   DARUNAVIR-COBICISCTAT-EMTRICITABINE-TENOFOVIR ALAFENAMIDE (SYMTUZA) 800-150-200-10 MG TABS SYMTUZA 800-150-200-10 MG TABS      Take 1 tablet by mouth daily with breakfast.    TAKE 1 TABLET BY MOUTH DAILY WITH BREAKFAST.   DOLUTEGRAVIR (TIVICAY) 50 MG TABLET TIVICAY 50 MG tablet      Take 1 tablet (50 mg total) by mouth daily.    TAKE 1 TABLET (50 MG TOTAL) BY MOUTH DAILY.  Discontinued Medications   MELOXICAM (MOBIC) 15 MG TABLET    Take 1 tablet (15 mg total) by mouth daily.    Subjective: Ronald Castaneda is in for his routine HIV follow-up visit.  He has not had any problems obtaining, taking or tolerating his Symtuza or Tivicay and does not recall missing any doses.  He has not taken a Covid vaccine yet.  He says he is just not ready.  He seems to be concerned about potential side effects although his wife took the vaccine and had no problems.  He continues to smoke cigarettes.  He is not feeling anxious or depressed.  Review of Systems: Review of Systems  Constitutional: Negative for chills, malaise/fatigue and weight loss.  Respiratory: Positive for shortness of breath. Negative for cough and sputum production.   Cardiovascular: Negative for chest pain.  Gastrointestinal: Negative for  abdominal pain, diarrhea, nausea and vomiting.  Musculoskeletal: Negative for back pain and joint pain.  Skin: Negative for rash.  Psychiatric/Behavioral: Negative for depression. The patient is not nervous/anxious.     Past Medical History:  Diagnosis Date  . Depression   . HIV infection ([place])   . Tobacco use     Social History   Tobacco Use  . Smoking status: Current Every Day Smoker    Packs/day: 0.50    Years: 46.00    Pack years: 23.00    Types: Cigarettes  . Smokeless tobacco: Never Used  . Tobacco comment: stressed at work, unable to decrease at this time; thinking about cutting down "it's getting old"  Substance Use Topics  . Alcohol use: Yes    Alcohol/week: 6.0 standard drinks    Types: 6 Cans of beer per week    Comment: weekend drinker (3 -4 beers)  . Drug use: No    Family History  Problem Relation Age of Onset  . Hypertension Mother   . Healthy Sister   . Healthy Brother   . Diabetes type I Daughter     No Known Allergies  Health Maintenance  Topic Date Due  . COVID-19 Vaccine (1) Never done  . TETANUS/TDAP  Never done  . COLONOSCOPY  Never done  . INFLUENZA VACCINE  12/23/2019  . Hepatitis C Screening  Completed  . HIV Screening  Completed    Objective:  Vitals:  03/26/20 0956  BP: (!) 171/99  Pulse: 62  Temp: 97.9 F (36.6 C)  TempSrc: Oral  SpO2: 98%  Weight: 158 lb (71.7 kg)  Height: 5' 9.5" (1.765 m)   Body mass index is 23 kg/m.  Physical Exam Constitutional:      Comments: His spirits are good.  Cardiovascular:     Rate and Rhythm: Normal rate and regular rhythm.     Heart sounds: No murmur heard.   Pulmonary:     Breath sounds: Normal breath sounds.  Abdominal:     Palpations: Abdomen is soft.     Tenderness: There is no abdominal tenderness.  Musculoskeletal:        General: No swelling or tenderness.  Skin:    Findings: No rash.  Neurological:     General: No focal deficit present.  Psychiatric:        Mood  and Affect: Mood normal.     Lab Results Lab Results  Component Value Date   WBC 9.2 06/07/2019   HGB 14.8 06/07/2019   HCT 43.9 06/07/2019   MCV 88.3 06/07/2019   PLT 268 06/07/2019    Lab Results  Component Value Date   CREATININE 1.06 06/07/2019   BUN 18 06/07/2019   NA 141 06/07/2019   K 4.0 06/07/2019   CL 106 06/07/2019   CO2 27 06/07/2019    Lab Results  Component Value Date   ALT 13 06/07/2019   AST 18 06/07/2019   ALKPHOS 95 09/09/2015   BILITOT 0.3 06/07/2019    Lab Results  Component Value Date   CHOL 203 (H) 06/07/2019   HDL 49 06/07/2019   LDLCALC 130 (H) 06/07/2019   TRIG 127 06/07/2019   CHOLHDL 4.1 06/07/2019   Lab Results  Component Value Date   LABRPR NON-REACTIVE 06/07/2019   HIV 1 RNA Quant (copies/mL)  Date Value  06/07/2019 <20 NOT DETECTED  12/06/2018 <20 DETECTED (A)  03/08/2018 <20 NOT DETECTED   CD4 T Cell Abs (/uL)  Date Value  06/07/2019 473  12/06/2018 443  03/08/2018 290 (L)     Problem List Items Addressed This Visit      High   HIV disease ([place])    His infection remains under excellent, long-term control.  He will get repeat blood work today, continue his current regimen and follow-up in 6 months.  I did encourage him to start the Covid vaccines.      Relevant Medications   dolutegravir (TIVICAY) 50 MG tablet   Darunavir-Cobicisctat-Emtricitabine-Tenofovir Alafenamide (SYMTUZA) 800-150-200-10 MG TABS   Other Relevant Orders   T-helper cell (CD4)- ([facility] clinic only)   HIV-1 RNA quant-no reflex-bld   CBC   Comprehensive metabolic panel   Lipid panel   RPR   T-helper cell (CD4)- ([facility] clinic only)   HIV-1 RNA quant-no reflex-bld     Unprioritized   Tobacco abuse    That he probably has early COPD causing some dyspnea on her shin and encouraged him to consider quitting cigarettes.      Depression    His depression is in remission.           Michel Bickers, MD [facility] for Infectious  [facility] [phone] pager   [phone] cell 03/26/2020, 10:13 AM

## 2020-03-26 NOTE — Assessment & Plan Note (Signed)
His depression is in remission. 

## 2020-03-27 LAB — T-HELPER CELL (CD4) - (RCID CLINIC ONLY)
CD4 % Helper T Cell: 23 % — ABNORMAL LOW (ref 33–65)
CD4 T Cell Abs: 446 /uL (ref 400–1790)

## 2020-03-29 LAB — HIV-1 RNA QUANT-NO REFLEX-BLD
HIV 1 RNA Quant: <20 Copies/mL — ABNORMAL HIGH
HIV-1 RNA Quant, Log: <1.3 Log cps/mL — ABNORMAL HIGH

## 2020-04-03 MED FILL — SYMTUZA 800-150-200-10 MG T: 800-150-200 | 30 days supply | Qty: 30 | Fill #0

## 2020-04-03 MED FILL — TIVICAY 50 MG TABLET: 50 | 30 days supply | Qty: 30 | Fill #0

## 2020-04-30 MED FILL — SYMTUZA 800-150-200-10 MG T: 800-150-200 | 30 days supply | Qty: 30 | Fill #1

## 2020-04-30 MED FILL — TIVICAY 50 MG TABLET: 50 | 30 days supply | Qty: 30 | Fill #1

## 2020-05-26 MED FILL — SYMTUZA 800-150-200-10 MG T: 800-150-200 | 30 days supply | Qty: 30 | Fill #2

## 2020-05-26 MED FILL — TIVICAY 50 MG TABLET: 50 | 30 days supply | Qty: 30 | Fill #2

## 2020-06-25 MED FILL — TIVICAY 50 MG TABLET: 50 | 30 days supply | Qty: 30 | Fill #3

## 2020-06-25 MED FILL — SYMTUZA 800-150-200-10 MG T: 800-150-200 | 30 days supply | Qty: 30 | Fill #3

## 2020-07-22 MED FILL — TIVICAY 50 MG TABLET: 50 | 30 days supply | Qty: 30 | Fill #4

## 2020-07-22 MED FILL — SYMTUZA 800-150-200-10 MG T: 800-150-200 | 30 days supply | Qty: 30 | Fill #4

## 2020-08-19 MED FILL — SYMTUZA 800-150-200-10 MG T: 800-150-200 | 30 days supply | Qty: 30 | Fill #5

## 2020-08-19 MED FILL — TIVICAY 50 MG TABLET: 50 | 30 days supply | Qty: 30 | Fill #5

## 2020-08-21 ENCOUNTER — Other Ambulatory Visit (HOSPITAL_COMMUNITY): Payer: Self-pay

## 2020-09-11 ENCOUNTER — Other Ambulatory Visit (HOSPITAL_COMMUNITY): Payer: Self-pay

## 2020-09-11 MED FILL — Dolutegravir Sodium Tab 50 MG (Base Equiv): ORAL | 30 days supply | Qty: 30 | Fill #0 | Status: AC

## 2020-09-11 MED FILL — Darunavir-Cobic-Emtricitab-Tenofov AF Tab 800-150-200-10 MG: ORAL | 30 days supply | Qty: 30 | Fill #0 | Status: AC

## 2020-09-15 ENCOUNTER — Other Ambulatory Visit (HOSPITAL_COMMUNITY): Payer: Self-pay

## 2020-09-24 ENCOUNTER — Other Ambulatory Visit: Payer: Self-pay

## 2020-09-24 ENCOUNTER — Ambulatory Visit (INDEPENDENT_AMBULATORY_CARE_PROVIDER_SITE_OTHER): Payer: BC Managed Care – PPO | Admitting: Internal Medicine

## 2020-09-24 ENCOUNTER — Encounter: Payer: Self-pay | Admitting: Internal Medicine

## 2020-09-24 DIAGNOSIS — Z720 Tobacco use: Secondary | ICD-10-CM

## 2020-09-24 DIAGNOSIS — F3342 Major depressive disorder, recurrent, in full remission: Secondary | ICD-10-CM | POA: Diagnosis not present

## 2020-09-24 DIAGNOSIS — B20 Human immunodeficiency virus [HIV] disease: Secondary | ICD-10-CM | POA: Diagnosis not present

## 2020-09-24 MED ORDER — DARUN-COBIC-EMTRICIT-TENOFAF 800-150-200-10 MG PO TABS: 1.0000 | ORAL_TABLET | Freq: Every day | ORAL | 11 refills | Status: DC

## 2020-09-24 MED ORDER — DOLUTEGRAVIR SODIUM 50 MG PO TABS: ORAL_TABLET | Freq: Every day | ORAL | 11 refills | Status: DC

## 2020-09-24 NOTE — Assessment & Plan Note (Signed)
I encouraged him to continue to cut down on cigarettes and consider setting a quit date.

## 2020-09-24 NOTE — Progress Notes (Signed)
Patient Active Problem List   Diagnosis Date Noted  . HIV disease ([place]) 02/27/2015    Priority: High  . Cervical disc disorder with radiculopathy of cervical region 06/17/2016  . Parotid nodule 06/17/2016  . Erectile dysfunction 09/09/2015  . Depression 04/01/2015  . Routine general medical examination at a health care facility 05/03/2014  . Lipoma of back 05/03/2014  . Tobacco abuse 05/03/2014    Patient's Medications  New Prescriptions   No medications on file  Previous Medications   ASPIRIN-SALICYLAMIDE-CAFFEINE (BC HEADACHE POWDER PO)    Take 1 packet by mouth every 6 (six) hours as needed (pain). Reported on 07/01/2015  Modified Medications   Modified Medication Previous Medication   DARUNAVIR-COBICISCTAT-EMTRICITABINE-TENOFOVIR ALAFENAMIDE (SYMTUZA) 800-150-200-10 MG TABS Darunavir-Cobicisctat-Emtricitabine-Tenofovir Alafenamide (SYMTUZA) 800-150-200-10 MG TABS      TAKE 1 TABLET BY MOUTH DAILY WITH BREAKFAST.    TAKE 1 TABLET BY MOUTH DAILY WITH BREAKFAST.   DOLUTEGRAVIR (TIVICAY) 50 MG TABLET dolutegravir (TIVICAY) 50 MG tablet      TAKE 1 TABLET (50 MG TOTAL) BY MOUTH DAILY.    TAKE 1 TABLET (50 MG TOTAL) BY MOUTH DAILY.  Discontinued Medications   No medications on file    Subjective: Ronald Castaneda is in for his routine HIV follow-up visit.  He denies any problems obtaining, taking or tolerating his Symtuza or Tivicay.  He takes it each evening when he gets off of work.  He does not recall missing any doses.  He is not on any new medications.  He denies feeling anxious or depressed.  He is feeling well.  He continues to smoke cigarettes but says he has been trying to cut down.  He does not have a plan to quit completely yet.  He has not taken a COVID-vaccine.  Review of Systems: Review of Systems  Constitutional: Negative for fever and weight loss.  Respiratory: Negative for cough and shortness of breath.   Cardiovascular: Negative for chest pain.   Gastrointestinal: Negative for abdominal pain, diarrhea, nausea and vomiting.  Psychiatric/Behavioral: Negative for depression. The patient is not nervous/anxious.     Past Medical History:  Diagnosis Date  . Depression   . HIV infection ([place])   . Tobacco use     Social History   Tobacco Use  . Smoking status: Current Every Day Smoker    Packs/day: 0.50    Years: 46.00    Pack years: 23.00    Types: Cigarettes  . Smokeless tobacco: Never Used  . Tobacco comment: stressed at work, unable to decrease at this time; thinking about cutting down "it's getting old"  Substance Use Topics  . Alcohol use: Yes    Alcohol/week: 6.0 standard drinks    Types: 6 Cans of beer per week    Comment: weekend drinker (3 -4 beers)  . Drug use: No    Family History  Problem Relation Age of Onset  . Hypertension Mother   . Healthy Sister   . Healthy Brother   . Diabetes type I Daughter     No Known Allergies  Health Maintenance  Topic Date Due  . COVID-19 Vaccine (1) Never done  . TETANUS/TDAP  Never done  . COLONOSCOPY (Pts 45-27yrs Insurance coverage will need to be confirmed)  Never done  . INFLUENZA VACCINE  12/22/2020  . Hepatitis C Screening  Completed  . HIV Screening  Completed  . HPV VACCINES  Aged Out    Objective:  Vitals:  09/24/20 0858  BP: (!) 147/96  Pulse: 68  Temp: 98 F (36.7 C)  TempSrc: Oral  SpO2: 97%  Weight: 156 lb (70.8 kg)  Height: 5\' 9"  (1.753 m)   Body mass index is 23.04 kg/m.  Physical Exam Constitutional:      Comments: He is in good spirits.  Cardiovascular:     Rate and Rhythm: Normal rate and regular rhythm.     Heart sounds: No murmur heard.   Pulmonary:     Effort: Pulmonary effort is normal.     Breath sounds: Normal breath sounds.  Abdominal:     Palpations: Abdomen is soft.     Tenderness: There is no abdominal tenderness.  Psychiatric:        Mood and Affect: Mood normal.     Lab Results Lab Results  Component  Value Date   WBC 9.2 06/07/2019   HGB 14.8 06/07/2019   HCT 43.9 06/07/2019   MCV 88.3 06/07/2019   PLT 268 06/07/2019    Lab Results  Component Value Date   CREATININE 1.06 06/07/2019   BUN 18 06/07/2019   NA 141 06/07/2019   K 4.0 06/07/2019   CL 106 06/07/2019   CO2 27 06/07/2019    Lab Results  Component Value Date   ALT 13 06/07/2019   AST 18 06/07/2019   ALKPHOS 95 09/09/2015   BILITOT 0.3 06/07/2019    Lab Results  Component Value Date   CHOL 203 (H) 06/07/2019   HDL 49 06/07/2019   LDLCALC 130 (H) 06/07/2019   TRIG 127 06/07/2019   CHOLHDL 4.1 06/07/2019   Lab Results  Component Value Date   LABRPR NON-REACTIVE 06/07/2019   HIV 1 RNA Quant  Date Value  03/26/2020 <20 Copies/mL (H)  06/07/2019 <20 NOT DETECTED copies/mL  12/06/2018 <20 DETECTED copies/mL (A)   CD4 T Cell Abs (/uL)  Date Value  03/26/2020 446  06/07/2019 473  12/06/2018 443     Problem List Items Addressed This Visit      High   HIV disease ([place])    His infection remains under excellent long-term control on his current salvage regimen.  His adherence is excellent now.  I told him again that I do recommend COVID vaccination.  He will get blood work today and follow-up in 6 months.      Relevant Medications   Darunavir-Cobicisctat-Emtricitabine-Tenofovir Alafenamide (SYMTUZA) 800-150-200-10 MG TABS   dolutegravir (TIVICAY) 50 MG tablet   Other Relevant Orders   T-helper cell (CD4)- ([facility] clinic only)   HIV-1 RNA quant-no reflex-bld   CBC   Comprehensive metabolic panel   RPR   Lipid panel     Unprioritized   Tobacco abuse    I encouraged him to continue to cut down on cigarettes and consider setting a quit date.      Depression    His depression is in remission.           Michel Bickers, MD [facility] for [facility] [place] [facility] [phone] pager   [phone] cell 09/24/2020, 9:10 AM

## 2020-09-24 NOTE — Assessment & Plan Note (Signed)
His infection remains under excellent long-term control on his current salvage regimen.  His adherence is excellent now.  I told him again that I do recommend COVID vaccination.  He will get blood work today and follow-up in 6 months.

## 2020-09-24 NOTE — Assessment & Plan Note (Signed)
His depression is in remission. 

## 2020-09-28 LAB — COMPREHENSIVE METABOLIC PANEL
AG Ratio: 1.3 (calc) (ref 1.0–2.5)
ALT: 15 U/L (ref 9–46)
AST: 20 U/L (ref 10–35)
Albumin: 4.1 g/dL (ref 3.6–5.1)
Alkaline phosphatase (APISO): 89 U/L (ref 35–144)
BUN: 20 mg/dL (ref 7–25)
CO2: 26 mmol/L (ref 20–32)
Calcium: 9.4 mg/dL (ref 8.6–10.3)
Chloride: 105 mmol/L (ref 98–110)
Creat: 1.1 mg/dL (ref 0.70–1.25)
Globulin: 3.1 g/dL (calc) (ref 1.9–3.7)
Glucose, Bld: 96 mg/dL (ref 65–99)
Potassium: 4.3 mmol/L (ref 3.5–5.3)
Sodium: 139 mmol/L (ref 135–146)
Total Bilirubin: 0.2 mg/dL (ref 0.2–1.2)
Total Protein: 7.2 g/dL (ref 6.1–8.1)

## 2020-09-28 LAB — LIPID PANEL
Cholesterol: 225 mg/dL — ABNORMAL HIGH (ref <200)
HDL: 45 mg/dL (ref >=40)
LDL Cholesterol (Calc): 142 mg/dL (calc) — ABNORMAL HIGH
Non-HDL Cholesterol (Calc): 180 mg/dL (calc) — ABNORMAL HIGH (ref <130)
Total CHOL/HDL Ratio: 5 (calc) — ABNORMAL HIGH (ref <5.0)
Triglycerides: 238 mg/dL — ABNORMAL HIGH (ref <150)

## 2020-09-28 LAB — HIV-1 RNA QUANT-NO REFLEX-BLD
HIV 1 RNA Quant: <20 Copies/mL — ABNORMAL HIGH
HIV-1 RNA Quant, Log: <1.3 Log cps/mL — ABNORMAL HIGH

## 2020-09-28 LAB — RPR: RPR Ser Ql: NONREACTIVE

## 2020-09-28 LAB — CBC
HCT: 47.3 % (ref 38.5–50.0)
Hemoglobin: 15.5 g/dL (ref 13.2–17.1)
MCH: 28.4 pg (ref 27.0–33.0)
MCHC: 32.8 g/dL (ref 32.0–36.0)
MCV: 86.6 fL (ref 80.0–100.0)
MPV: 10.8 fL (ref 7.5–12.5)
Platelets: 255 10*3/uL (ref 140–400)
RBC: 5.46 10*6/uL (ref 4.20–5.80)
RDW: 13.8 % (ref 11.0–15.0)
WBC: 6.1 10*3/uL (ref 3.8–10.8)

## 2020-10-09 ENCOUNTER — Other Ambulatory Visit (HOSPITAL_COMMUNITY): Payer: Self-pay

## 2020-10-09 ENCOUNTER — Other Ambulatory Visit: Payer: Self-pay | Admitting: Internal Medicine

## 2020-10-09 DIAGNOSIS — B20 Human immunodeficiency virus [HIV] disease: Secondary | ICD-10-CM

## 2020-10-09 MED ORDER — TIVICAY 50 MG PO TABS
ORAL_TABLET | Freq: Every day | ORAL | 11 refills | Status: DC
  Filled 2020-10-09: qty 30, 30d supply, fill #0
  Filled 2020-10-31: qty 30, fill #0
  Filled 2020-11-03: qty 30, 30d supply, fill #0
  Filled 2020-12-02: qty 30, 30d supply, fill #1
  Filled 2020-12-25: qty 30, 30d supply, fill #2
  Filled 2021-01-23: qty 30, 30d supply, fill #3
  Filled 2021-02-20: qty 30, 30d supply, fill #4
  Filled 2021-03-20: qty 30, 30d supply, fill #5

## 2020-10-09 MED ORDER — SYMTUZA 800-150-200-10 MG PO TABS
1.0000 | ORAL_TABLET | Freq: Every day | ORAL | 11 refills | Status: DC
  Filled 2020-10-09 – 2020-11-03 (×3): qty 30, 30d supply, fill #0
  Filled 2020-12-02: qty 30, 30d supply, fill #1
  Filled 2020-12-25: qty 30, 30d supply, fill #2
  Filled 2021-01-23: qty 30, 30d supply, fill #3
  Filled 2021-02-20: qty 30, 30d supply, fill #4
  Filled 2021-03-20: qty 30, 30d supply, fill #5

## 2020-10-10 ENCOUNTER — Other Ambulatory Visit (HOSPITAL_COMMUNITY): Payer: Self-pay

## 2020-10-11 ENCOUNTER — Other Ambulatory Visit (HOSPITAL_COMMUNITY): Payer: Self-pay

## 2020-10-21 ENCOUNTER — Ambulatory Visit: Payer: BC Managed Care – PPO | Admitting: Internal Medicine

## 2020-10-23 ENCOUNTER — Encounter: Payer: Self-pay | Admitting: Internal Medicine

## 2020-10-23 ENCOUNTER — Ambulatory Visit (INDEPENDENT_AMBULATORY_CARE_PROVIDER_SITE_OTHER): Payer: BC Managed Care – PPO | Admitting: Internal Medicine

## 2020-10-23 ENCOUNTER — Other Ambulatory Visit: Payer: Self-pay

## 2020-10-23 ENCOUNTER — Ambulatory Visit (INDEPENDENT_AMBULATORY_CARE_PROVIDER_SITE_OTHER): Payer: BC Managed Care – PPO

## 2020-10-23 VITALS — BP 132/84 | HR 76 | Temp 98.7°F | Resp 18 | Ht 69.0 in | Wt 156.4 lb

## 2020-10-23 DIAGNOSIS — M25552 Pain in left hip: Secondary | ICD-10-CM | POA: Insufficient documentation

## 2020-10-23 MED ORDER — PREDNISONE 20 MG PO TABS: 40.0000 mg | ORAL_TABLET | Freq: Every day | ORAL | 0 refills | Status: DC

## 2020-10-23 NOTE — Patient Instructions (Signed)
We are checking the x-ray today and will call you about the results.  We have sent in prednisone to take 2 pills daily for 5 days to help the pain go away.   Hip Bursitis Rehab Ask your health care provider which exercises are safe for you. Do exercises exactly as told by your health care provider and adjust them as directed. It is normal to feel mild stretching, pulling, tightness, or discomfort as you do these exercises. Stop right away if you feel sudden pain or your pain gets worse. Do not begin these exercises until told by your health care provider. Stretching exercise This exercise warms up your muscles and joints and improves the movement and flexibility of your hip. This exercise also helps to relieve pain and stiffness. Iliotibial band stretch An iliotibial band is a strong band of muscle tissue that runs from the outer side of your hip to the outer side of your thigh and knee. 1. Lie on your side with your left / right leg in the top position. 2. Bend your left / right knee and grab your ankle. Stretch out your bottom arm to help you balance. 3. Slowly bring your knee back so your thigh is behind your body. 4. Slowly lower your knee toward the floor until you feel a gentle stretch on the outside of your left / right thigh. If you do not feel a stretch and your knee will not fall farther, place the heel of your other foot on top of your knee and pull your knee down toward the floor with your foot. 5. Hold this position for __________ seconds. 6. Slowly return to the starting position. Repeat __________ times. Complete this exercise __________ times a day.   Strengthening exercises These exercises build strength and endurance in your hip and pelvis. Endurance is the ability to use your muscles for a long time, even after they get tired. Bridge This exercise strengthens the muscles that move your thigh backward (hip extensors). 1. Lie on your back on a firm surface with your knees bent  and your feet flat on the floor. 2. Tighten your buttocks muscles and lift your buttocks off the floor until your trunk is level with your thighs. ? Do not arch your back. ? You should feel the muscles working in your buttocks and the back of your thighs. If you do not feel these muscles, slide your feet 1-2 inches (2.5-5 cm) farther away from your buttocks. ? If this exercise is too easy, try doing it with your arms crossed over your chest. 3. Hold this position for __________ seconds. 4. Slowly lower your hips to the starting position. 5. Let your muscles relax completely after each repetition. Repeat __________ times. Complete this exercise __________ times a day.   Squats This exercise strengthens the muscles in front of your thigh and knee (quadriceps). 1. Stand in front of a table, with your feet and knees pointing straight ahead. You may rest your hands on the table for balance but not for support. 2. Slowly bend your knees and lower your hips like you are going to sit in a chair. ? Keep your weight over your heels, not over your toes. ? Keep your lower legs upright so they are parallel with the table legs. ? Do not let your hips go lower than your knees. ? Do not bend lower than told by your health care provider. ? If your hip pain increases, do not bend as low. 3. Hold the  squat position for __________ seconds. 4. Slowly push with your legs to return to standing. Do not use your hands to pull yourself to standing. Repeat __________ times. Complete this exercise __________ times a day. Hip hike 1. Stand sideways on a bottom step. Stand on your left / right leg with your other foot unsupported next to the step. You can hold on to the railing or wall for balance if needed. 2. Keep your knees straight and your torso square. Then lift your left / right hip up toward the ceiling. 3. Hold this position for __________ seconds. 4. Slowly let your left / right hip lower toward the floor, past  the starting position. Your foot should get closer to the floor. Do not lean or bend your knees. Repeat __________ times. Complete this exercise __________ times a day. Single leg stand 1. Without shoes, stand near a railing or in a doorway. You may hold on to the railing or door frame as needed for balance. 2. Squeeze your left / right buttock muscles, then lift up your other foot. ? Do not let your left / right hip push out to the side. ? It is helpful to stand in front of a mirror for this exercise so you can watch your hip. 3. Hold this position for __________ seconds. Repeat __________ times. Complete this exercise __________ times a day. This information is not intended to replace advice given to you by your health care provider. Make sure you discuss any questions you have with your health care provider. Document Revised: 09/04/2018 Document Reviewed: 09/04/2018 Elsevier Patient Education  [place].

## 2020-10-23 NOTE — Progress Notes (Signed)
   Subjective:   Patient ID: Ronald Castaneda, male    DOB: Sep 25, 1955, 65 y.o.   MRN: [id number]  HPI The patient is a 65 YO man coming in for left hip pain about 2 weeks or so. Taken BC powder which helps but only lasts short time. Denies injury or fall. Denies weakness or numbness. 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  Review of Systems  Constitutional: Negative.   HENT: Negative.   Respiratory: Negative for cough, chest tightness and shortness of breath.   Cardiovascular: Negative for chest pain, palpitations and leg swelling.  Gastrointestinal: Negative for abdominal distention, abdominal pain, constipation, diarrhea, nausea and vomiting.  Musculoskeletal: Positive for arthralgias and myalgias.  Skin: Negative.   Neurological: Negative.   Psychiatric/Behavioral: Negative.     Objective:  Physical Exam Constitutional:      Appearance: He is well-developed.  HENT:     Head: Normocephalic and atraumatic.  Cardiovascular:     Rate and Rhythm: Normal rate and regular rhythm.  Pulmonary:     Effort: Pulmonary effort is normal. No respiratory distress.     Breath sounds: Normal breath sounds. No wheezing or rales.  Abdominal:     General: Bowel sounds are normal. There is no distension.     Palpations: Abdomen is soft.     Tenderness: There is no abdominal tenderness. There is no rebound.  Musculoskeletal:        General: Tenderness present.     Cervical back: Normal range of motion.     Comments: Pain left inguinal region, no pain lateral left thigh region, no numbness or weakness left leg  Skin:    General: Skin is warm and dry.  Neurological:     Mental Status: He is alert and oriented to person, place, and time.     Coordination: Coordination normal.     Vitals:   10/23/20 0832  BP: 132/84  Pulse: 76  Resp: 18  Temp: 98.7 F (37.1 C)  TempSrc: Oral  SpO2: 94%  Weight: 156 lb 6.4 oz (70.9 kg)  Height: 5\' 9"  (1.753 m)     This visit occurred during the SARS-CoV-2 public health emergency.  Safety protocols were in place, including screening questions prior to the visit, additional usage of staff PPE, and extensive cleaning of exam room while observing appropriate contact time as indicated for disinfecting solutions.   Assessment & Plan:

## 2020-10-23 NOTE — Assessment & Plan Note (Signed)
Ordered x-ray left hip. Rx prednisone short to see if this helps.

## 2020-10-31 ENCOUNTER — Other Ambulatory Visit (HOSPITAL_COMMUNITY): Payer: Self-pay

## 2020-11-03 ENCOUNTER — Other Ambulatory Visit (HOSPITAL_COMMUNITY): Payer: Self-pay

## 2020-11-05 ENCOUNTER — Other Ambulatory Visit (HOSPITAL_COMMUNITY): Payer: Self-pay

## 2020-11-19 DIAGNOSIS — H20012 Primary iridocyclitis, left eye: Secondary | ICD-10-CM | POA: Diagnosis not present

## 2020-12-02 ENCOUNTER — Other Ambulatory Visit (HOSPITAL_COMMUNITY): Payer: Self-pay

## 2020-12-04 ENCOUNTER — Other Ambulatory Visit (HOSPITAL_COMMUNITY): Payer: Self-pay

## 2020-12-18 ENCOUNTER — Other Ambulatory Visit (HOSPITAL_BASED_OUTPATIENT_CLINIC_OR_DEPARTMENT_OTHER): Payer: Self-pay

## 2020-12-25 ENCOUNTER — Other Ambulatory Visit (HOSPITAL_COMMUNITY): Payer: Self-pay

## 2020-12-31 ENCOUNTER — Other Ambulatory Visit (HOSPITAL_COMMUNITY): Payer: Self-pay

## 2021-01-23 ENCOUNTER — Other Ambulatory Visit (HOSPITAL_COMMUNITY): Payer: Self-pay

## 2021-01-27 ENCOUNTER — Other Ambulatory Visit (HOSPITAL_COMMUNITY): Payer: Self-pay

## 2021-02-20 ENCOUNTER — Other Ambulatory Visit (HOSPITAL_COMMUNITY): Payer: Self-pay

## 2021-03-20 ENCOUNTER — Other Ambulatory Visit (HOSPITAL_COMMUNITY): Payer: Self-pay

## 2021-03-23 ENCOUNTER — Other Ambulatory Visit (HOSPITAL_COMMUNITY): Payer: Self-pay

## 2021-04-01 ENCOUNTER — Ambulatory Visit: Payer: BC Managed Care – PPO | Admitting: Internal Medicine

## 2021-04-09 ENCOUNTER — Other Ambulatory Visit (HOSPITAL_COMMUNITY): Payer: Self-pay

## 2021-04-09 ENCOUNTER — Encounter: Payer: Self-pay | Admitting: Internal Medicine

## 2021-04-09 ENCOUNTER — Ambulatory Visit (INDEPENDENT_AMBULATORY_CARE_PROVIDER_SITE_OTHER): Payer: BC Managed Care – PPO | Admitting: Internal Medicine

## 2021-04-09 ENCOUNTER — Other Ambulatory Visit: Payer: Self-pay

## 2021-04-09 DIAGNOSIS — B20 Human immunodeficiency virus [HIV] disease: Secondary | ICD-10-CM

## 2021-04-09 MED ORDER — SYMTUZA 800-150-200-10 MG PO TABS
1.0000 | ORAL_TABLET | Freq: Every day | ORAL | 11 refills | Status: DC
  Filled 2021-04-09 – 2021-04-13 (×2): qty 30, 30d supply, fill #0
  Filled 2021-05-06: qty 30, 30d supply, fill #1
  Filled 2021-06-02: qty 30, 30d supply, fill #2
  Filled 2021-06-29: qty 30, 30d supply, fill #3
  Filled 2021-07-27: qty 30, 30d supply, fill #4
  Filled 2021-08-20: qty 30, 30d supply, fill #5
  Filled 2021-09-11: qty 30, 30d supply, fill #6
  Filled 2021-10-12: qty 30, 30d supply, fill #7

## 2021-04-09 MED ORDER — TIVICAY 50 MG PO TABS
ORAL_TABLET | Freq: Every day | ORAL | 11 refills | Status: DC
  Filled 2021-04-09: qty 30, fill #0
  Filled 2021-04-13: qty 30, 30d supply, fill #0
  Filled 2021-05-06: qty 30, 30d supply, fill #1
  Filled 2021-06-02: qty 30, 30d supply, fill #2
  Filled 2021-06-29: qty 30, 30d supply, fill #3
  Filled 2021-07-27: qty 30, 30d supply, fill #4
  Filled 2021-08-20: qty 30, 30d supply, fill #5
  Filled 2021-09-11: qty 30, 30d supply, fill #6
  Filled 2021-10-12: qty 30, 30d supply, fill #7

## 2021-04-09 NOTE — Progress Notes (Signed)
Patient Active Problem List   Diagnosis Date Noted   HIV disease ([place]) 02/27/2015    Priority: High   Left hip pain 10/23/2020   Cervical disc disorder with radiculopathy of cervical region 06/17/2016   Parotid nodule 06/17/2016   Erectile dysfunction 09/09/2015   Depression 04/01/2015   Routine general medical examination at a health care facility 05/03/2014   Lipoma of back 05/03/2014   Tobacco abuse 05/03/2014    Patient's Medications  New Prescriptions   No medications on file  Previous Medications   ASPIRIN-SALICYLAMIDE-CAFFEINE (BC HEADACHE POWDER PO)    Take 1 packet by mouth every 6 (six) hours as needed (pain). Reported on 07/01/2015   PREDNISONE (DELTASONE) 20 MG TABLET    Take 2 tablets (40 mg total) by mouth daily with breakfast.  Modified Medications   Modified Medication Previous Medication   DARUNAVIR-COBICISTAT-EMTRICITABINE-TENOFOVIR ALAFENAMIDE (SYMTUZA) 800-150-200-10 MG TABS Darunavir-Cobicistat-Emtricitabine-Tenofovir Alafenamide (SYMTUZA) 800-150-200-10 MG TABS      TAKE 1 TABLET BY MOUTH DAILY WITH BREAKFAST.    TAKE 1 TABLET BY MOUTH DAILY WITH BREAKFAST.   DOLUTEGRAVIR (TIVICAY) 50 MG TABLET dolutegravir (TIVICAY) 50 MG tablet      TAKE 1 TABLET (50 MG TOTAL) BY MOUTH DAILY.    TAKE 1 TABLET (50 MG TOTAL) BY MOUTH DAILY.  Discontinued Medications   No medications on file    Subjective: Ronald Castaneda is in for his routine HIV follow-up visit.  He denies any problems obtaining, taking or tolerating his Symtuza or Tivicay.  He does not recall missing doses.  He takes them each morning right after he gets off of work.  He says that he is considering retirement but his wife wants him to continue working.  He is still smoking about 1/2 pack of cigarettes daily and has no current plans to quit.  He does not want his annual influenza or COVID booster vaccines.  Review of Systems: Review of Systems  Constitutional:  Negative for fever.  Musculoskeletal:   Positive for back pain and joint pain.  Psychiatric/Behavioral:  Negative for depression.    Past Medical History:  Diagnosis Date   Depression    HIV infection ([place])    Tobacco use     Social History   Tobacco Use   Smoking status: Every Day    Packs/day: 0.50    Years: 46.00    Pack years: 23.00    Types: Cigarettes   Smokeless tobacco: Never   Tobacco comments:    stressed at work, unable to decrease at this time; thinking about cutting down "it's getting old"  Substance Use Topics   Alcohol use: Yes    Alcohol/week: 6.0 standard drinks    Types: 6 Cans of beer per week    Comment: weekend drinker (3 -4 beers)   Drug use: No    Family History  Problem Relation Age of Onset   Hypertension Mother    Healthy Sister    Healthy Brother    Diabetes type I Daughter     No Known Allergies  Health Maintenance  Topic Date Due   COVID-19 Vaccine (1) Never done   TETANUS/TDAP  Never done   Zoster Vaccines- Shingrix (1 of 2) Never done   COLONOSCOPY (Pts 45-31yrs Insurance coverage will need to be confirmed)  Never done   Pneumonia Vaccine 62+ Years old (2 - PCV) 03/16/2017   INFLUENZA VACCINE  12/22/2020   Hepatitis C Screening  Completed   HIV  Screening  Completed   HPV VACCINES  Aged Out    Objective:  Vitals:   04/09/21 0918  BP: 137/67  Pulse: 75  Temp: 97.8 F (36.6 C)  TempSrc: Temporal  Weight: 159 lb (72.1 kg)   Body mass index is 23.48 kg/m.  Physical Exam Constitutional:      Comments: He is in good spirits.  Cardiovascular:     Rate and Rhythm: Normal rate.     Pulses: Normal pulses.  Psychiatric:        Mood and Affect: Mood normal.    Lab Results Lab Results  Component Value Date   WBC 6.1 09/24/2020   HGB 15.5 09/24/2020   HCT 47.3 09/24/2020   MCV 86.6 09/24/2020   PLT 255 09/24/2020    Lab Results  Component Value Date   CREATININE 1.10 09/24/2020   BUN 20 09/24/2020   NA 139 09/24/2020   K 4.3 09/24/2020   CL 105  09/24/2020   CO2 26 09/24/2020    Lab Results  Component Value Date   ALT 15 09/24/2020   AST 20 09/24/2020   ALKPHOS 95 09/09/2015   BILITOT 0.2 09/24/2020    Lab Results  Component Value Date   CHOL 225 (H) 09/24/2020   HDL 45 09/24/2020   LDLCALC 142 (H) 09/24/2020   TRIG 238 (H) 09/24/2020   CHOLHDL 5.0 (H) 09/24/2020   Lab Results  Component Value Date   LABRPR NON-REACTIVE 09/24/2020   HIV 1 RNA Quant  Date Value  09/24/2020 <20 Copies/mL (H)  03/26/2020 <20 Copies/mL (H)  06/07/2019 <20 NOT DETECTED copies/mL   CD4 T Cell Abs (/uL)  Date Value  03/26/2020 446  06/07/2019 473  12/06/2018 443     Problem List Items Addressed This Visit       High   HIV disease ([place])    And his adherence has improved greatly over the past several years and his infection has been an good long-term control.  He will get repeat lab work today.  He will continue Symtuza and Tivicay and follow-up in 6 months.      Relevant Medications   Darunavir-Cobicistat-Emtricitabine-Tenofovir Alafenamide (SYMTUZA) 800-150-200-10 MG TABS   dolutegravir (TIVICAY) 50 MG tablet   Other Relevant Orders   T-helper cell (CD4)- ([facility] clinic only)   HIV-1 RNA quant-no reflex-bld      Michel Bickers, MD [facility] [place] [phone] pager   [phone] cell 04/09/2021, 9:58 AM

## 2021-04-09 NOTE — Assessment & Plan Note (Signed)
And his adherence has improved greatly over the past several years and his infection has been an good long-term control.  He will get repeat lab work today.  He will continue Symtuza and Tivicay and follow-up in 6 months.

## 2021-04-10 LAB — T-HELPER CELL (CD4) - (RCID CLINIC ONLY)
CD4 % Helper T Cell: 27 % — ABNORMAL LOW (ref 33–65)
CD4 T Cell Abs: 478 /uL (ref 400–1790)

## 2021-04-13 ENCOUNTER — Other Ambulatory Visit (HOSPITAL_COMMUNITY): Payer: Self-pay

## 2021-04-13 LAB — HIV-1 RNA QUANT-NO REFLEX-BLD
HIV 1 RNA Quant: 30 Copies/mL — ABNORMAL HIGH
HIV-1 RNA Quant, Log: 1.47 Log cps/mL — ABNORMAL HIGH

## 2021-04-14 ENCOUNTER — Other Ambulatory Visit (HOSPITAL_COMMUNITY): Payer: Self-pay

## 2021-04-15 ENCOUNTER — Other Ambulatory Visit (HOSPITAL_COMMUNITY): Payer: Self-pay

## 2021-04-17 ENCOUNTER — Encounter (HOSPITAL_COMMUNITY): Payer: Self-pay

## 2021-04-17 ENCOUNTER — Emergency Department (HOSPITAL_COMMUNITY)
Admission: EM | Admit: 2021-04-17 | Discharge: 2021-04-17 | Disposition: A | Discharge status: Home / Self Care | Payer: BC Managed Care – PPO | Attending: Emergency Medicine | Admitting: Emergency Medicine

## 2021-04-17 ENCOUNTER — Emergency Department (HOSPITAL_COMMUNITY): Payer: BC Managed Care – PPO

## 2021-04-17 DIAGNOSIS — Z21 Asymptomatic human immunodeficiency virus [HIV] infection status: Secondary | ICD-10-CM | POA: Insufficient documentation

## 2021-04-17 DIAGNOSIS — Z20822 Contact with and (suspected) exposure to covid-19: Secondary | ICD-10-CM | POA: Insufficient documentation

## 2021-04-17 DIAGNOSIS — R0600 Dyspnea, unspecified: Secondary | ICD-10-CM

## 2021-04-17 DIAGNOSIS — R0602 Shortness of breath: Secondary | ICD-10-CM | POA: Insufficient documentation

## 2021-04-17 DIAGNOSIS — Z79899 Other long term (current) drug therapy: Secondary | ICD-10-CM | POA: Diagnosis not present

## 2021-04-17 DIAGNOSIS — F17210 Nicotine dependence, cigarettes, uncomplicated: Secondary | ICD-10-CM | POA: Insufficient documentation

## 2021-04-17 DIAGNOSIS — J80 Acute respiratory distress syndrome: Secondary | ICD-10-CM | POA: Diagnosis not present

## 2021-04-17 DIAGNOSIS — Z720 Tobacco use: Secondary | ICD-10-CM

## 2021-04-17 DIAGNOSIS — I10 Essential (primary) hypertension: Secondary | ICD-10-CM | POA: Diagnosis not present

## 2021-04-17 DIAGNOSIS — R059 Cough, unspecified: Secondary | ICD-10-CM | POA: Diagnosis not present

## 2021-04-17 LAB — CBC WITH DIFFERENTIAL/PLATELET
Abs Immature Granulocytes: 0.01 10*3/uL (ref 0.00–0.07)
Basophils Absolute: 0.1 10*3/uL (ref 0.0–0.1)
Basophils Relative: 1 %
Eosinophils Absolute: 0.2 10*3/uL (ref 0.0–0.5)
Eosinophils Relative: 2 %
HCT: 44.1 % (ref 39.0–52.0)
Hemoglobin: 15.2 g/dL (ref 13.0–17.0)
Immature Granulocytes: 0 %
Lymphocytes Relative: 34 %
Lymphs Abs: 3 10*3/uL (ref 0.7–4.0)
MCH: 27.7 pg (ref 26.0–34.0)
MCHC: 34.5 g/dL (ref 30.0–36.0)
MCV: 80.5 fL (ref 80.0–100.0)
Monocytes Absolute: 0.8 10*3/uL (ref 0.1–1.0)
Monocytes Relative: 9 %
Neutro Abs: 5 10*3/uL (ref 1.7–7.7)
Neutrophils Relative %: 54 %
Platelets: 231 10*3/uL (ref 150–400)
RBC: 5.48 MIL/uL (ref 4.22–5.81)
RDW: 15.8 % — ABNORMAL HIGH (ref 11.5–15.5)
WBC: 8.9 10*3/uL (ref 4.0–10.5)
nRBC: 0 % (ref 0.0–0.2)

## 2021-04-17 LAB — RESP PANEL BY RT-PCR (FLU A&B, COVID) ARPGX2
Influenza A by PCR: NEGATIVE
Influenza B by PCR: NEGATIVE
SARS Coronavirus 2 by RT PCR: NEGATIVE

## 2021-04-17 LAB — BASIC METABOLIC PANEL
Anion gap: 7 (ref 5–15)
BUN: 16 mg/dL (ref 8–23)
CO2: 29 mmol/L (ref 22–32)
Calcium: 9.1 mg/dL (ref 8.9–10.3)
Chloride: 102 mmol/L (ref 98–111)
Creatinine, Ser: 0.77 mg/dL (ref 0.61–1.24)
GFR, Estimated: >60 mL/min (ref >60)
Glucose, Bld: 99 mg/dL (ref 70–99)
Potassium: 4.1 mmol/L (ref 3.5–5.1)
Sodium: 138 mmol/L (ref 135–145)

## 2021-04-17 MED ORDER — SODIUM CHLORIDE 0.9 % IV BOLUS
1000.0000 mL | Freq: Once | INTRAVENOUS | Status: AC
  Administered 2021-04-17: 1000 mL via INTRAVENOUS

## 2021-04-17 NOTE — ED Triage Notes (Signed)
Pt complaining of SOB, pt alert and oriented, anxiety, walked to ambulance diminished lung sounds 92%, 10 albuterol 0.5 Atrovent given reroute, 2g magnesium, 125 solumedrol by EMS.

## 2021-04-17 NOTE — Discharge Instructions (Addendum)
Bloodwork looks good today.  Your EKG is normal.  Your chest x-ray does not reveal any pneumonia.  I am attaching information about shortness of breath as well as steps to quitting smoking to these papers.  Please return to the emergency department if you begin to have severe chest pain, inability to catch her breath or overall worsening of your condition.  Follow-up with your primary care provider soon as possible.  I am also attaching a community clinic in the event that you are no longer established with the primary care provider on these papers.

## 2021-04-17 NOTE — ED Provider Notes (Signed)
[facility] EMERGENCY DEPARTMENT Provider Note   CSN: [id number] Arrival date & time: 04/17/21  1318     History No chief complaint on file.   Ronald Castaneda is a 65 y.o. male with a past medical history of tobacco use presenting today with a complaint of shortness of breath.  He reports that on Monday he was feeling sick with URI symptoms.  Endorsed chills and night sweats.  However he began to feel better on Wednesday.  Today he started coughing and felt short of breath.  Denies any chest pain or palpitations.  No dizziness or syncope.  Sick contacts at work.  He does not have any cardiac history and has never seen a cardiologist.  Past Medical History:  Diagnosis Date   Depression    HIV infection ([place])    Tobacco use     Patient Active Problem List   Diagnosis Date Noted   Left hip pain 10/23/2020   Cervical disc disorder with radiculopathy of cervical region 06/17/2016   Parotid nodule 06/17/2016   Erectile dysfunction 09/09/2015   Depression 04/01/2015   HIV disease ([place]) 02/27/2015   Routine general medical examination at a health care facility 05/03/2014   Lipoma of back 05/03/2014   Tobacco abuse 05/03/2014    Past Surgical History:  Procedure Laterality Date   None         Family History  Problem Relation Age of Onset   Hypertension Mother    Healthy Sister    Healthy Brother    Diabetes type I Daughter     Social History   Tobacco Use   Smoking status: Every Day    Packs/day: 0.50    Years: 46.00    Pack years: 23.00    Types: Cigarettes   Smokeless tobacco: Never   Tobacco comments:    stressed at work, unable to decrease at this time; thinking about cutting down "it's getting old"  Substance Use Topics   Alcohol use: Yes    Alcohol/week: 6.0 standard drinks    Types: 6 Cans of beer per week    Comment: weekend drinker (3 -4 beers)   Drug use: No    Home Medications Prior to Admission medications   Medication Sig  Start Date End Date Taking? Authorizing Provider  Aspirin-Salicylamide-Caffeine (BC HEADACHE POWDER PO) Take 1 packet by mouth every 6 (six) hours as needed (pain). Reported on 07/01/2015    [provider]  Darunavir-Cobicistat-Emtricitabine-Tenofovir Alafenamide (SYMTUZA) 800-150-200-10 MG TABS TAKE 1 TABLET BY MOUTH DAILY WITH BREAKFAST. 04/09/21 04/09/22  Michel Bickers, MD  dolutegravir (TIVICAY) 50 MG tablet TAKE 1 TABLET (50 MG TOTAL) BY MOUTH DAILY. 04/09/21 04/09/22  Michel Bickers, MD  predniSONE (DELTASONE) 20 MG tablet Take 2 tablets (40 mg total) by mouth daily with breakfast. 10/23/20   Hoyt Koch, MD    Allergies    Patient has no known allergies.  Review of Systems   Review of Systems  Constitutional:  Positive for chills.  HENT:  Positive for rhinorrhea.   Respiratory:  Positive for cough and shortness of breath.   Cardiovascular:  Negative for chest pain and palpitations.  Gastrointestinal:  Negative for diarrhea, nausea and vomiting.  All other systems reviewed and are negative.  Physical Exam Updated Vital Signs Ht 5\' 9"  (1.753 m)   Wt 73 kg   BMI 23.77 kg/m   Physical Exam Vitals and nursing note reviewed.  Constitutional:      Appearance: Normal appearance. He is  not ill-appearing.  HENT:     Head: Normocephalic and atraumatic.     Right Ear: Tympanic membrane normal.     Left Ear: Tympanic membrane normal.     Nose: Nose normal.     Mouth/Throat:     Mouth: Mucous membranes are moist.     Pharynx: Oropharynx is clear.  Eyes:     General: No scleral icterus.    Conjunctiva/sclera: Conjunctivae normal.     Pupils: Pupils are equal, round, and reactive to light.  Cardiovascular:     Rate and Rhythm: Normal rate and regular rhythm.  Pulmonary:     Effort: Pulmonary effort is normal. No respiratory distress.     Breath sounds: No wheezing or rales.  Skin:    General: Skin is warm and dry.     Findings: No rash.  Neurological:      Mental Status: He is alert.  Psychiatric:        Mood and Affect: Mood normal.    ED Results / Procedures / Treatments   Labs (all labs ordered are listed, but only abnormal results are displayed) Labs Reviewed  RESP PANEL BY RT-PCR (FLU A&B, COVID) [id number]  BASIC METABOLIC PANEL  CBC WITH DIFFERENTIAL/PLATELET    EKG None  Radiology No results found.  Procedures Procedures   Medications Ordered in ED Medications  sodium chloride 0.9 % bolus 1,000 mL (0 mLs Intravenous Stopped 04/17/21 1522)    ED Course  I have reviewed the triage vital signs and the nursing notes.  Pertinent labs & imaging results that were available during my care of the patient were reviewed by me and considered in my medical decision making (see chart for details).    MDM Rules/Calculators/A&P The emergent differential diagnosis for shortness of breath includes, but is not limited to, Pulmonary edema, bronchoconstriction, Pneumonia, Pulmonary embolism, Pneumotherax/ Hemothorax, Dysrythmia, ACS.  All of these were considered throughout the evaluation of the patient.  Upon my first assessment patient was tachycardic.  Received multiple nebulizers and Solu-Medrol in route with EMS.  EMS reported hypoxia however I removed patient from oxygen and he continued to have a saturation between 95 and 100.  He reported that he has been around multiple sick people at work, and felt as though he was getting viral symptoms earlier this week.  Presentation appears more consistent with URI.   Chest x-ray without pneumonia.  EKG in sinus rhythm, tachycardic likely secondary to nebulizers.  Blood work is without abnormalities.  COVID and flu tests were both negative.  Oxygen saturations noted to be in low 90s however patient denies feeling short of breath at this time. Patient reports that he would like to go home.  I believe he is stable to do so if he is able to ambulate without desaturation.    Patient oxygen remained at  95 throughout ambulation.  Return precautions were discussed at length.  He voiced understanding and is ready to go home.  Resources for smoking cessation attached to his discharge papers as he has expressed that he would like to quit smoking after what happened today.  Counseled patient for approximately 6 minutes regarding smoking cessation. Discussed risks of smoking and how they applied and affected their visit here today. Patient motivated to quit at this time, resources given.   Final Clinical Impression(s) / ED Diagnoses Final diagnoses:  Dyspnea, unspecified type  Tobacco use    Rx / DC Orders Results and diagnoses were explained to the patient. Return precautions  discussed in full. Patient had no additional questions and expressed complete understanding.     Darliss Ridgel 04/17/21 1548    Davonna Belling, MD 04/18/21 [phone]

## 2021-04-20 ENCOUNTER — Encounter (HOSPITAL_BASED_OUTPATIENT_CLINIC_OR_DEPARTMENT_OTHER): Payer: Self-pay | Admitting: *Deleted

## 2021-04-20 ENCOUNTER — Telehealth: Payer: Self-pay | Admitting: Internal Medicine

## 2021-04-20 ENCOUNTER — Other Ambulatory Visit: Payer: Self-pay

## 2021-04-20 ENCOUNTER — Observation Stay (HOSPITAL_BASED_OUTPATIENT_CLINIC_OR_DEPARTMENT_OTHER)
Admission: EM | Admit: 2021-04-20 | Discharge: 2021-04-21 | Disposition: A | Discharge status: Home / Self Care | Payer: BC Managed Care – PPO | Attending: Internal Medicine | Admitting: Internal Medicine

## 2021-04-20 ENCOUNTER — Telehealth: Payer: Self-pay

## 2021-04-20 ENCOUNTER — Emergency Department (HOSPITAL_BASED_OUTPATIENT_CLINIC_OR_DEPARTMENT_OTHER): Payer: BC Managed Care – PPO

## 2021-04-20 ENCOUNTER — Emergency Department (HOSPITAL_BASED_OUTPATIENT_CLINIC_OR_DEPARTMENT_OTHER): Payer: BC Managed Care – PPO | Admitting: Radiology

## 2021-04-20 DIAGNOSIS — I160 Hypertensive urgency: Secondary | ICD-10-CM | POA: Diagnosis not present

## 2021-04-20 DIAGNOSIS — E1169 Type 2 diabetes mellitus with other specified complication: Secondary | ICD-10-CM

## 2021-04-20 DIAGNOSIS — Z20822 Contact with and (suspected) exposure to covid-19: Secondary | ICD-10-CM | POA: Diagnosis not present

## 2021-04-20 DIAGNOSIS — I700 Atherosclerosis of aorta: Secondary | ICD-10-CM

## 2021-04-20 DIAGNOSIS — Z79899 Other long term (current) drug therapy: Secondary | ICD-10-CM | POA: Diagnosis not present

## 2021-04-20 DIAGNOSIS — I10 Essential (primary) hypertension: Secondary | ICD-10-CM

## 2021-04-20 DIAGNOSIS — F17200 Nicotine dependence, unspecified, uncomplicated: Secondary | ICD-10-CM

## 2021-04-20 DIAGNOSIS — B20 Human immunodeficiency virus [HIV] disease: Secondary | ICD-10-CM | POA: Diagnosis present

## 2021-04-20 DIAGNOSIS — R778 Other specified abnormalities of plasma proteins: Secondary | ICD-10-CM | POA: Diagnosis not present

## 2021-04-20 DIAGNOSIS — J441 Chronic obstructive pulmonary disease with (acute) exacerbation: Secondary | ICD-10-CM | POA: Diagnosis not present

## 2021-04-20 DIAGNOSIS — R059 Cough, unspecified: Secondary | ICD-10-CM | POA: Diagnosis not present

## 2021-04-20 DIAGNOSIS — J439 Emphysema, unspecified: Secondary | ICD-10-CM | POA: Diagnosis not present

## 2021-04-20 DIAGNOSIS — F17210 Nicotine dependence, cigarettes, uncomplicated: Secondary | ICD-10-CM | POA: Insufficient documentation

## 2021-04-20 DIAGNOSIS — R0602 Shortness of breath: Secondary | ICD-10-CM

## 2021-04-20 DIAGNOSIS — Z87891 Personal history of nicotine dependence: Secondary | ICD-10-CM | POA: Diagnosis present

## 2021-04-20 DIAGNOSIS — IMO0001 Reserved for inherently not codable concepts without codable children: Secondary | ICD-10-CM

## 2021-04-20 DIAGNOSIS — E7800 Pure hypercholesterolemia, unspecified: Secondary | ICD-10-CM

## 2021-04-20 DIAGNOSIS — J449 Chronic obstructive pulmonary disease, unspecified: Secondary | ICD-10-CM

## 2021-04-20 DIAGNOSIS — Z720 Tobacco use: Secondary | ICD-10-CM | POA: Diagnosis present

## 2021-04-20 DIAGNOSIS — R0609 Other forms of dyspnea: Secondary | ICD-10-CM

## 2021-04-20 DIAGNOSIS — R030 Elevated blood-pressure reading, without diagnosis of hypertension: Secondary | ICD-10-CM

## 2021-04-20 DIAGNOSIS — R7989 Other specified abnormal findings of blood chemistry: Secondary | ICD-10-CM

## 2021-04-20 LAB — CBC WITH DIFFERENTIAL/PLATELET
Abs Immature Granulocytes: 0.03 10*3/uL (ref 0.00–0.07)
Basophils Absolute: 0 10*3/uL (ref 0.0–0.1)
Basophils Relative: 0 %
Eosinophils Absolute: 0.1 10*3/uL (ref 0.0–0.5)
Eosinophils Relative: 1 %
HCT: 46.6 % (ref 39.0–52.0)
Hemoglobin: 16.3 g/dL (ref 13.0–17.0)
Immature Granulocytes: 0 %
Lymphocytes Relative: 28 %
Lymphs Abs: 2.6 10*3/uL (ref 0.7–4.0)
MCH: 27.8 pg (ref 26.0–34.0)
MCHC: 35 g/dL (ref 30.0–36.0)
MCV: 79.4 fL — ABNORMAL LOW (ref 80.0–100.0)
Monocytes Absolute: 0.8 10*3/uL (ref 0.1–1.0)
Monocytes Relative: 8 %
Neutro Abs: 5.9 10*3/uL (ref 1.7–7.7)
Neutrophils Relative %: 63 %
Platelets: 237 10*3/uL (ref 150–400)
RBC: 5.87 MIL/uL — ABNORMAL HIGH (ref 4.22–5.81)
RDW: 16.2 % — ABNORMAL HIGH (ref 11.5–15.5)
WBC: 9.3 10*3/uL (ref 4.0–10.5)
nRBC: 0 % (ref 0.0–0.2)

## 2021-04-20 LAB — COMPREHENSIVE METABOLIC PANEL
ALT: 23 U/L (ref 0–44)
AST: 33 U/L (ref 15–41)
Albumin: 4 g/dL (ref 3.5–5.0)
Alkaline Phosphatase: 82 U/L (ref 38–126)
Anion gap: 9 (ref 5–15)
BUN: 27 mg/dL — ABNORMAL HIGH (ref 8–23)
CO2: 26 mmol/L (ref 22–32)
Calcium: 8.8 mg/dL — ABNORMAL LOW (ref 8.9–10.3)
Chloride: 104 mmol/L (ref 98–111)
Creatinine, Ser: 0.97 mg/dL (ref 0.61–1.24)
GFR, Estimated: >60 mL/min (ref >60)
Glucose, Bld: 122 mg/dL — ABNORMAL HIGH (ref 70–99)
Potassium: 3.6 mmol/L (ref 3.5–5.1)
Sodium: 139 mmol/L (ref 135–145)
Total Bilirubin: 0.5 mg/dL (ref 0.3–1.2)
Total Protein: 7.7 g/dL (ref 6.5–8.1)

## 2021-04-20 LAB — RESP PANEL BY RT-PCR (FLU A&B, COVID) ARPGX2
Influenza A by PCR: NEGATIVE
Influenza B by PCR: NEGATIVE
SARS Coronavirus 2 by RT PCR: NEGATIVE

## 2021-04-20 LAB — TROPONIN I (HIGH SENSITIVITY)
Troponin I (High Sensitivity): 22 ng/L — ABNORMAL HIGH (ref <18)
Troponin I (High Sensitivity): 66 ng/L — ABNORMAL HIGH (ref <18)
Troponin I (High Sensitivity): 64 ng/L — ABNORMAL HIGH (ref <18)

## 2021-04-20 LAB — BRAIN NATRIURETIC PEPTIDE: B Natriuretic Peptide: 46.8 pg/mL (ref 0.0–100.0)

## 2021-04-20 MED ORDER — IPRATROPIUM-ALBUTEROL 0.5-2.5 (3) MG/3ML IN SOLN
3.0000 mL | Freq: Once | RESPIRATORY_TRACT | Status: AC
  Administered 2021-04-20: 14:00:00 3 mL via RESPIRATORY_TRACT
  Filled 2021-04-20: qty 3

## 2021-04-20 MED ORDER — GUAIFENESIN ER 600 MG PO TB12
600.0000 mg | ORAL_TABLET | Freq: Two times a day (BID) | ORAL | Status: DC
  Administered 2021-04-20 – 2021-04-21 (×2): 600 mg via ORAL
  Filled 2021-04-20 (×2): qty 1

## 2021-04-20 MED ORDER — ALBUTEROL SULFATE (2.5 MG/3ML) 0.083% IN NEBU: 2.5000 mg | INHALATION_SOLUTION | RESPIRATORY_TRACT | Status: DC | PRN

## 2021-04-20 MED ORDER — NICOTINE 14 MG/24HR TD PT24
14.0000 mg | MEDICATED_PATCH | Freq: Every day | TRANSDERMAL | Status: DC
  Filled 2021-04-20: qty 1

## 2021-04-20 MED ORDER — HYDRALAZINE HCL 20 MG/ML IJ SOLN
5.0000 mg | INTRAMUSCULAR | Status: DC | PRN
  Filled 2021-04-20: qty 1

## 2021-04-20 MED ORDER — PREDNISONE 50 MG PO TABS
60.0000 mg | ORAL_TABLET | Freq: Once | ORAL | Status: AC
  Administered 2021-04-20: 14:00:00 60 mg via ORAL
  Filled 2021-04-20: qty 1

## 2021-04-20 MED ORDER — AEROCHAMBER PLUS FLO-VU MEDIUM MISC
1.0000 | Freq: Once | Status: AC
  Administered 2021-04-20: 13:00:00 1
  Filled 2021-04-20: qty 1

## 2021-04-20 MED ORDER — ACETAMINOPHEN 325 MG PO TABS: 650.0000 mg | ORAL_TABLET | Freq: Four times a day (QID) | ORAL | Status: DC | PRN

## 2021-04-20 MED ORDER — IPRATROPIUM-ALBUTEROL 0.5-2.5 (3) MG/3ML IN SOLN
3.0000 mL | Freq: Four times a day (QID) | RESPIRATORY_TRACT | Status: DC
  Administered 2021-04-20: 21:00:00 3 mL via RESPIRATORY_TRACT
  Filled 2021-04-20 (×3): qty 3

## 2021-04-20 MED ORDER — ALBUTEROL SULFATE HFA 108 (90 BASE) MCG/ACT IN AERS: 4.0000 | INHALATION_SPRAY | Freq: Once | RESPIRATORY_TRACT | Status: AC

## 2021-04-20 MED ORDER — IOHEXOL 350 MG/ML SOLN
60.0000 mL | Freq: Once | INTRAVENOUS | Status: AC | PRN
  Administered 2021-04-20: 12:00:00 60 mL via INTRAVENOUS

## 2021-04-20 MED ORDER — ACETAMINOPHEN 650 MG RE SUPP: 650.0000 mg | Freq: Four times a day (QID) | RECTAL | Status: DC | PRN

## 2021-04-20 MED ORDER — DOXYCYCLINE HYCLATE 100 MG PO TABS
100.0000 mg | ORAL_TABLET | Freq: Two times a day (BID) | ORAL | Status: DC
  Administered 2021-04-20 – 2021-04-21 (×2): 100 mg via ORAL
  Filled 2021-04-20 (×2): qty 1

## 2021-04-20 MED ORDER — ALBUTEROL SULFATE HFA 108 (90 BASE) MCG/ACT IN AERS
INHALATION_SPRAY | RESPIRATORY_TRACT | Status: AC
  Administered 2021-04-20: 13:00:00 4 via RESPIRATORY_TRACT
  Filled 2021-04-20: qty 6.7

## 2021-04-20 MED ORDER — PREDNISONE 20 MG PO TABS
60.0000 mg | ORAL_TABLET | Freq: Every day | ORAL | Status: DC
  Administered 2021-04-21: 60 mg via ORAL
  Filled 2021-04-20: qty 3

## 2021-04-20 MED ORDER — ENOXAPARIN SODIUM 40 MG/0.4ML IJ SOSY
40.0000 mg | PREFILLED_SYRINGE | INTRAMUSCULAR | Status: DC
  Administered 2021-04-20: 21:00:00 40 mg via SUBCUTANEOUS
  Filled 2021-04-20: qty 0.4

## 2021-04-20 MED ORDER — IPRATROPIUM BROMIDE HFA 17 MCG/ACT IN AERS: 2.0000 | INHALATION_SPRAY | Freq: Once | RESPIRATORY_TRACT | Status: DC

## 2021-04-20 NOTE — ED Triage Notes (Signed)
Cough started last Monday, SHOB on Friday, went to ED. Tested for covid, x-ray, Friday

## 2021-04-20 NOTE — H&P (Signed)
History and Physical    Ronald Castaneda BOF:[id number] DOB: 01/03/1956 DOA: 04/20/2021  PCP: Hoyt Koch, MD Patient coming from:[facility] ED  Chief Complaint: Shortness of breath  HPI: Ronald Castaneda is a 65 y.o. male with medical history significant of HIV, depression, tobacco use presented to the ED complaining of shortness of breath.  Blood pressure elevated, remainder of vital signs stable.  Wheezing on exam.  Labs showing no leukocytosis or anemia.  BNP normal.  High-sensitivity troponin 22 >66.  EKG without acute ischemic changes.  COVID and influenza PCR negative.  CT angiogram chest negative for PE; showing evidence of emphysema and diffuse bilateral bronchial wall thickening. Patient was given prednisone 60 mg and bronchodilator treatments.  Patient states he works third shift and was exposed to cold weather and thinks this might have triggered his symptoms.  States around Thanksgiving time he started having difficulty breathing and started wheezing.  Also coughing up yellow sputum.  States he was seen in the emergency room on Friday and was told that all of his tests came back normal and he was sent home.  However, symptoms persisted which made him return to the emergency room today.  He denies history of lung or heart problems.  He has never used an inhaler before.  Reports smoking up to a pack of cigarettes daily since the age of 64.  Denies chest pain.  Denies history of high blood pressure.  No other complaints.  Review of Systems:  All systems reviewed and apart from history of presenting illness, are negative.  Past Medical History:  Diagnosis Date   Depression    HIV infection ([place])    Tobacco use     Past Surgical History:  Procedure Laterality Date   None       reports that he has been smoking cigarettes. He has a 23.00 pack-year smoking history. He has never used smokeless tobacco. He reports current alcohol use of about 6.0 standard drinks per week. He reports that  he does not use drugs.  No Known Allergies  Family History  Problem Relation Age of Onset   Hypertension Mother    Healthy Sister    Healthy Brother    Diabetes type I Daughter     Prior to Admission medications   Medication Sig Start Date End Date Taking? Authorizing Provider  Aspirin-Salicylamide-Caffeine (BC HEADACHE POWDER PO) Take 1 packet by mouth every 6 (six) hours as needed (pain).    [provider]  Darunavir-Cobicistat-Emtricitabine-Tenofovir Alafenamide (SYMTUZA) 800-150-200-10 MG TABS TAKE 1 TABLET BY MOUTH DAILY WITH BREAKFAST. Patient taking differently: Take 2 tablets by mouth daily with breakfast. 04/09/21 04/09/22  Michel Bickers, MD  dolutegravir (TIVICAY) 50 MG tablet TAKE 1 TABLET (50 MG TOTAL) BY MOUTH DAILY. Patient taking differently: Take 50 mg by mouth daily. 04/09/21 04/09/22  Michel Bickers, MD    Physical Exam: Vitals:   04/20/21 1630 04/20/21 1700 04/20/21 1730 04/20/21 1843  BP: (!) 159/139 (!) 159/102 (!) 155/104 (!) 173/100  Pulse: 71 70 70 68  Resp: (!) 9 12  16   Temp:    98.3 F (36.8 C)  TempSrc:    Oral  SpO2: 100% 98% 98% 96%  Weight:    73 kg  Height:    5\' 9"  (1.753 m)    Physical Exam Constitutional:      General: He is not in acute distress. HENT:     Head: Normocephalic and atraumatic.  Eyes:     Extraocular Movements: Extraocular movements  intact.     Conjunctiva/sclera: Conjunctivae normal.  Cardiovascular:     Rate and Rhythm: Normal rate and regular rhythm.     Pulses: Normal pulses.  Pulmonary:     Effort: Pulmonary effort is normal. No respiratory distress.     Breath sounds: No wheezing or rales.  Abdominal:     General: Bowel sounds are normal. There is no distension.     Palpations: Abdomen is soft.     Tenderness: There is no abdominal tenderness.  Musculoskeletal:        General: No swelling or tenderness.     Cervical back: Normal range of motion and neck supple.  Skin:    General: Skin is warm  and dry.  Neurological:     General: No focal deficit present.     Mental Status: He is alert and oriented to person, place, and time.     Labs on Admission: I have personally reviewed following labs and imaging studies  CBC: Recent Labs  Lab 04/17/21 1332 04/20/21 1107  WBC 8.9 9.3  NEUTROABS 5.0 5.9  HGB 15.2 16.3  HCT 44.1 46.6  MCV 80.5 79.4*  PLT 231 656   Basic Metabolic Panel: Recent Labs  Lab 04/17/21 1332 04/20/21 1107  NA 138 139  K 4.1 3.6  CL 102 104  CO2 29 26  GLUCOSE 99 122*  BUN 16 27*  CREATININE 0.77 0.97  CALCIUM 9.1 8.8*   GFR: Estimated Creatinine Clearance: 75.9 mL/min (by C-G formula based on SCr of 0.97 mg/dL). Liver Function Tests: Recent Labs  Lab 04/20/21 1107  AST 33  ALT 23  ALKPHOS 82  BILITOT 0.5  PROT 7.7  ALBUMIN 4.0   No results for input(s): LIPASE, AMYLASE in the last 168 hours. No results for input(s): AMMONIA in the last 168 hours. Coagulation Profile: No results for input(s): INR, PROTIME in the last 168 hours. Cardiac Enzymes: No results for input(s): CKTOTAL, CKMB, CKMBINDEX, TROPONINI in the last 168 hours. BNP (last 3 results) No results for input(s): PROBNP in the last 8760 hours. HbA1C: No results for input(s): HGBA1C in the last 72 hours. CBG: No results for input(s): GLUCAP in the last 168 hours. Lipid Profile: No results for input(s): CHOL, HDL, LDLCALC, TRIG, CHOLHDL, LDLDIRECT in the last 72 hours. Thyroid Function Tests: No results for input(s): TSH, T4TOTAL, FREET4, T3FREE, THYROIDAB in the last 72 hours. Anemia Panel: No results for input(s): VITAMINB12, FOLATE, FERRITIN, TIBC, IRON, RETICCTPCT in the last 72 hours. Urine analysis:    Component Value Date/Time   COLORURINE YELLOW 06/02/2016 1426   APPEARANCEUR CLEAR 06/02/2016 1426   LABSPEC >=1.030 07/18/2016 1608   PHURINE 6.0 07/18/2016 1608   GLUCOSEU NEGATIVE 07/18/2016 1608   HGBUR MODERATE (A) 07/18/2016 1608   BILIRUBINUR NEGATIVE  07/18/2016 1608   KETONESUR NEGATIVE 07/18/2016 1608   PROTEINUR 100 (A) 07/18/2016 1608   UROBILINOGEN 0.2 07/18/2016 1608   NITRITE NEGATIVE 07/18/2016 1608   LEUKOCYTESUR TRACE (A) 07/18/2016 1608    Radiological Exams on Admission: DG Chest 2 View  Result Date: 04/20/2021 CLINICAL DATA:  Shortness of breath and cough beginning a week ago. EXAM: CHEST - 2 VIEW COMPARISON:  04/17/2021 FINDINGS: Heart size is normal. Aortic atherosclerotic calcification is visible. There is background emphysema but no evidence of acute infiltrate, collapse or effusion. No acute bone finding. IMPRESSION: Background emphysema.  No sign of acute infiltrate or collapse. Aortic atherosclerotic calcification. Electronically Signed   By: Jan Fireman.D.  On: 04/20/2021 11:40   CT Angio Chest PE W and/or Wo Contrast  Result Date: 04/20/2021 CLINICAL DATA:  Shortness of breath, cough EXAM: CT ANGIOGRAPHY CHEST WITH CONTRAST TECHNIQUE: Multidetector CT imaging of the chest was performed using the standard protocol during bolus administration of intravenous contrast. Multiplanar CT image reconstructions and MIPs were obtained to evaluate the vascular anatomy. CONTRAST:  76mL OMNIPAQUE IOHEXOL 350 MG/ML SOLN COMPARISON:  None. FINDINGS: Cardiovascular: Satisfactory opacification of the pulmonary arteries to the segmental level. No evidence of pulmonary embolism. Normal heart size. No pericardial effusion. Aortic atherosclerosis. Mediastinum/Nodes: No enlarged mediastinal, hilar, or axillary lymph nodes. Thyroid gland, trachea, and esophagus demonstrate no significant findings. Lungs/Pleura: Moderate centrilobular and paraseptal emphysema. Diffuse bilateral bronchial wall thickening. Elevation of the left hemidiaphragm with minimal associated scarring and ground-glass. No pleural effusion or pneumothorax. Upper Abdomen: No acute abnormality. Colonic diverticula. Benign adenomatous thickening of the adrenal glands.  Musculoskeletal: No chest wall abnormality. No acute or significant osseous findings. Review of the MIP images confirms the above findings. IMPRESSION: 1. Negative examination for pulmonary embolism. 2. Elevation of the left hemidiaphragm with minimal associated scarring and ground-glass, most likely sequelae of prior infection or aspiration, which may be ongoing. 3. Emphysema and diffuse bilateral bronchial wall thickening. Aortic Atherosclerosis (ICD10-I70.0) and Emphysema (ICD10-J43.9). Electronically Signed   By: Delanna Ahmadi M.D.   On: 04/20/2021 12:33    EKG: Independently reviewed.  Sinus rhythm, no significant change since prior tracing.  Assessment/Plan Principal Problem:   COPD exacerbation (HCC) Active Problems:   Tobacco abuse   HIV disease (HCC)   Elevated blood pressure reading   Elevated troponin   Suspected COPD exacerbation No documented history of COPD but does have longstanding history of heavy cigarette smoking.  Wheezing on arrival to the ED.  CT angiogram chest negative for PE; showing evidence of emphysema and diffuse bilateral bronchial wall thickening.  Patient was given prednisone 60 mg and bronchodilator treatments in the ED. Currently not wheezing or hypoxic. -Continue prednisone 60 mg daily for a total of 5 days.  DuoNeb every 6 hours, albuterol neb every 2 hours as needed, Mucinex, and doxycycline.  Flutter valve.  Supplemental oxygen as needed.  Will need outpatient PFTs.  Elevated blood pressure readings No documented history of hypertension.  Blood pressure elevated with systolic up to 226J and diastolic up to 335K. -Hydralazine PRN.  Start scheduled antihypertensive medication if blood pressure continues to be elevated.  Elevated troponin Possibly due to demand ischemia given elevated blood pressure. High-sensitivity troponin 22 >66.  ACS less likely as EKG without acute ischemic changes and patient is not endorsing chest pain. -Trend troponin.   Echocardiogram ordered.  HIV CD4 count 478 on 04/09/2021. -Resume Symtuza and Tivicay after pharmacy med rec is done.  Tobacco use -NicoDerm patch and counseling  DVT prophylaxis: Lovenox Code Status: Full code Family Communication: No family available at this time. Disposition Plan: Status is: Observation  The patient remains OBS appropriate and will d/c before 2 midnights.  Level of care: Level of care: Telemetry Medical  The medical decision making on this patient was of high complexity and the patient is at high risk for clinical deterioration, therefore this is a level 3 visit.  Shela Leff MD [facility]  If 7PM-7AM, please contact night-coverage [email or web address]  04/20/2021, 8:33 PM

## 2021-04-20 NOTE — ED Notes (Signed)
Patient requested bathroom assistance. Patient previously shob w/ exertion to the bathroom, but requested to walk to the bathroom instead of utilizing urinal. Patient continues to have shob w/ exertion to the bathroom and was instructed w/ breathing techniques when returned and placed on monitor. Patient given urinal during this time and within reach for patient use.

## 2021-04-20 NOTE — Telephone Encounter (Signed)
Pt has a upcoming appointment on Thursday

## 2021-04-20 NOTE — ED Notes (Signed)
Spouse at bedside, she's going to eat, this RN will call her if he gets a room before she returns

## 2021-04-20 NOTE — Telephone Encounter (Signed)
Patient's wife is calling re: SOB,can't breathe, wanted an appointment with Sharlet Salina today  Planned to transfer to team health, but his wife has called out the Paramedics to her home to evaluate him

## 2021-04-20 NOTE — ED Notes (Signed)
Patient in CT; will administer MDI when returned to room.

## 2021-04-20 NOTE — Telephone Encounter (Signed)
Patient left voicemail requesting follow up appointment with Dr. Megan Salon. Patient was seen in the ED on Friday for shortness of breath. Was found to have an URI. He reports that his SOB has not improved and he doesn't feel better. States he's waiting for his wife to pick him up to go back to the ED. RN recommended that he go to an Urgent care so he doesn't have to wait as long. RN also requested he follow up with his PCP as they would be the best to manage his care aside from HIV. Patient requested an inhaler. RN reiterated to follow up with PCP.   Elzabeth Mcquerry Lorita Officer, RN

## 2021-04-20 NOTE — Plan of Care (Signed)

## 2021-04-20 NOTE — Progress Notes (Addendum)
Notified by EDP of need for admission d/t DOE. [facility] accepts patient to tele at [facility]. EDP is to remain responsible for orders/medical decisions while patient is holding at [facility]. Upon arrival to [facility], [place] will assume care. Nursing staff will call patient placement to notify them of patient's arrival so that the proper [facility] member may receive the patient. Thank you.

## 2021-04-20 NOTE — ED Provider Notes (Signed)
[facility] EMERGENCY DEPT Provider Note   CSN: [id number] Arrival date & time: 04/20/21  1045     History Chief Complaint  Patient presents with   Shortness of Breath    Ronald Castaneda is a 65 y.o. male.  The history is provided by the patient.  Shortness of Breath Severity:  Mild Onset quality:  Gradual Duration:  1 week Timing:  Intermittent Progression:  Waxing and waning Chronicity:  New Relieved by:  Nothing Worsened by:  Nothing Associated symptoms: cough   Associated symptoms: no abdominal pain, no chest pain, no ear pain, no fever, no rash, no sore throat and no vomiting   Risk factors comment:  HIV     Past Medical History:  Diagnosis Date   Depression    HIV infection ([place])    Tobacco use     Patient Active Problem List   Diagnosis Date Noted   Dyspnea on exertion 04/20/2021   Left hip pain 10/23/2020   Cervical disc disorder with radiculopathy of cervical region 06/17/2016   Parotid nodule 06/17/2016   Erectile dysfunction 09/09/2015   Depression 04/01/2015   HIV disease ([place]) 02/27/2015   Routine general medical examination at a health care facility 05/03/2014   Lipoma of back 05/03/2014   Tobacco abuse 05/03/2014    Past Surgical History:  Procedure Laterality Date   None         Family History  Problem Relation Age of Onset   Hypertension Mother    Healthy Sister    Healthy Brother    Diabetes type I Daughter     Social History   Tobacco Use   Smoking status: Every Day    Packs/day: 0.50    Years: 46.00    Pack years: 23.00    Types: Cigarettes   Smokeless tobacco: Never   Tobacco comments:    stressed at work, unable to decrease at this time; thinking about cutting down "it's getting old"  Vaping Use   Vaping Use: Never used  Substance Use Topics   Alcohol use: Yes    Alcohol/week: 6.0 standard drinks    Types: 6 Cans of beer per week    Comment: weekend drinker (3 -4 beers)   Drug use: No    Home  Medications Prior to Admission medications   Medication Sig Start Date End Date Taking? Authorizing Provider  Aspirin-Salicylamide-Caffeine (BC HEADACHE POWDER PO) Take 1 packet by mouth every 6 (six) hours as needed (pain).    [provider]  Darunavir-Cobicistat-Emtricitabine-Tenofovir Alafenamide (SYMTUZA) 800-150-200-10 MG TABS TAKE 1 TABLET BY MOUTH DAILY WITH BREAKFAST. Patient taking differently: Take 2 tablets by mouth daily with breakfast. 04/09/21 04/09/22  Michel Bickers, MD  dolutegravir (TIVICAY) 50 MG tablet TAKE 1 TABLET (50 MG TOTAL) BY MOUTH DAILY. Patient taking differently: Take 50 mg by mouth daily. 04/09/21 04/09/22  Michel Bickers, MD    Allergies    Patient has no known allergies.  Review of Systems   Review of Systems  Constitutional:  Negative for chills and fever.  HENT:  Negative for ear pain and sore throat.   Eyes:  Negative for pain and visual disturbance.  Respiratory:  Positive for cough and shortness of breath.   Cardiovascular:  Negative for chest pain and palpitations.  Gastrointestinal:  Negative for abdominal pain and vomiting.  Genitourinary:  Negative for dysuria and hematuria.  Musculoskeletal:  Negative for arthralgias and back pain.  Skin:  Negative for color change and rash.  Neurological:  Negative  for seizures and syncope.  All other systems reviewed and are negative.  Physical Exam Updated Vital Signs  ED Triage Vitals  Enc Vitals Group     BP 04/20/21 1054 (!) 164/103     Pulse Rate 04/20/21 1054 82     Resp 04/20/21 1054 20     Temp 04/20/21 1054 97.9 F (36.6 C)     Temp src --      SpO2 04/20/21 1054 92 %     Weight 04/20/21 1056 160 lb 15 oz (73 kg)     Height 04/20/21 1056 5\' 9"  (1.753 m)     Head Circumference --      Peak Flow --      Pain Score 04/20/21 1056 0     Pain Loc --      Pain Edu? --      Excl. in [place]? --      Physical Exam Vitals and nursing note reviewed.  Constitutional:      General: He  is not in acute distress.    Appearance: He is well-developed. He is not ill-appearing.  HENT:     Head: Normocephalic and atraumatic.     Mouth/Throat:     Mouth: Mucous membranes are moist.  Eyes:     Conjunctiva/sclera: Conjunctivae normal.     Pupils: Pupils are equal, round, and reactive to light.  Neck:     Thyroid: No thyromegaly.  Cardiovascular:     Rate and Rhythm: Normal rate and regular rhythm.     Pulses: Normal pulses.     Heart sounds: Normal heart sounds. No murmur heard. Pulmonary:     Effort: Pulmonary effort is normal. No respiratory distress.     Breath sounds: Wheezing present.  Abdominal:     Palpations: Abdomen is soft.     Tenderness: There is no abdominal tenderness.  Musculoskeletal:        General: No swelling. Normal range of motion.     Cervical back: Normal range of motion and neck supple.     Right lower leg: No edema.     Left lower leg: No edema.  Skin:    General: Skin is warm and dry.     Capillary Refill: Capillary refill takes less than 2 seconds.  Neurological:     Mental Status: He is alert.  Psychiatric:        Mood and Affect: Mood normal.    ED Results / Procedures / Treatments   Labs (all labs ordered are listed, but only abnormal results are displayed) Labs Reviewed  CBC WITH DIFFERENTIAL/PLATELET - Abnormal; Notable for the following components:      Result Value   RBC 5.87 (*)    MCV 79.4 (*)    RDW 16.2 (*)    All other components within normal limits  COMPREHENSIVE METABOLIC PANEL - Abnormal; Notable for the following components:   Glucose, Bld 122 (*)    BUN 27 (*)    Calcium 8.8 (*)    All other components within normal limits  TROPONIN I (HIGH SENSITIVITY) - Abnormal; Notable for the following components:   Troponin I (High Sensitivity) 22 (*)    All other components within normal limits  RESP PANEL BY RT-PCR (FLU A&B, COVID) [id number]  BRAIN NATRIURETIC PEPTIDE  TROPONIN I (HIGH SENSITIVITY)     EKG None  Radiology DG Chest 2 View  Result Date: 04/20/2021 CLINICAL DATA:  Shortness of breath and cough beginning a week ago. EXAM: CHEST -  2 VIEW COMPARISON:  04/17/2021 FINDINGS: Heart size is normal. Aortic atherosclerotic calcification is visible. There is background emphysema but no evidence of acute infiltrate, collapse or effusion. No acute bone finding. IMPRESSION: Background emphysema.  No sign of acute infiltrate or collapse. Aortic atherosclerotic calcification. Electronically Signed   By: Nelson Chimes M.D.   On: 04/20/2021 11:40   CT Angio Chest PE W and/or Wo Contrast  Result Date: 04/20/2021 CLINICAL DATA:  Shortness of breath, cough EXAM: CT ANGIOGRAPHY CHEST WITH CONTRAST TECHNIQUE: Multidetector CT imaging of the chest was performed using the standard protocol during bolus administration of intravenous contrast. Multiplanar CT image reconstructions and MIPs were obtained to evaluate the vascular anatomy. CONTRAST:  41mL OMNIPAQUE IOHEXOL 350 MG/ML SOLN COMPARISON:  None. FINDINGS: Cardiovascular: Satisfactory opacification of the pulmonary arteries to the segmental level. No evidence of pulmonary embolism. Normal heart size. No pericardial effusion. Aortic atherosclerosis. Mediastinum/Nodes: No enlarged mediastinal, hilar, or axillary lymph nodes. Thyroid gland, trachea, and esophagus demonstrate no significant findings. Lungs/Pleura: Moderate centrilobular and paraseptal emphysema. Diffuse bilateral bronchial wall thickening. Elevation of the left hemidiaphragm with minimal associated scarring and ground-glass. No pleural effusion or pneumothorax. Upper Abdomen: No acute abnormality. Colonic diverticula. Benign adenomatous thickening of the adrenal glands. Musculoskeletal: No chest wall abnormality. No acute or significant osseous findings. Review of the MIP images confirms the above findings. IMPRESSION: 1. Negative examination for pulmonary embolism. 2. Elevation of the left  hemidiaphragm with minimal associated scarring and ground-glass, most likely sequelae of prior infection or aspiration, which may be ongoing. 3. Emphysema and diffuse bilateral bronchial wall thickening. Aortic Atherosclerosis (ICD10-I70.0) and Emphysema (ICD10-J43.9). Electronically Signed   By: Delanna Ahmadi M.D.   On: 04/20/2021 12:33    Procedures Procedures   Medications Ordered in ED Medications  albuterol (VENTOLIN HFA) 108 (90 Base) MCG/ACT inhaler 4 puff (4 puffs Inhalation Given 04/20/21 1240)  AeroChamber Plus Flo-Vu Medium MISC 1 each (1 each Other Given 04/20/21 1241)  iohexol (OMNIPAQUE) 350 MG/ML injection 60 mL (60 mLs Intravenous Contrast Given 04/20/21 1211)  predniSONE (DELTASONE) tablet 60 mg (60 mg Oral Given 04/20/21 1341)  ipratropium-albuterol (DUONEB) 0.5-2.5 (3) MG/3ML nebulizer solution 3 mL (3 mLs Nebulization Given 04/20/21 1350)    ED Course  I have reviewed the triage vital signs and the nursing notes.  Pertinent labs & imaging results that were available during my care of the patient were reviewed by me and considered in my medical decision making (see chart for details).    MDM Rules/Calculators/A&P                           Ronald Castaneda is here with shortness of breath for the last several days.  Recently seen for the same was discharged.  Had negative COVID test and chest x-ray at that time.  He is a heavy smoker.  History of HIV.  Hypertensive here but otherwise normal vitals.  EKG shows sinus rhythm.  No ischemic changes.  He has not had any specific chest pain but does have exertional shortness of breath.  No major leg swelling.  BNP is normal and chest x-ray negative for volume overload and doubt heart failure.  Troponin mildly elevated to 22.  Chest CT was performed that showed no blood clot or infectious process.  But there is some significant emphysema changes.  He does have some wheezing on exam.  He is not hypoxic but he is very symptomatic upon  ambulation.  Overall suspect that he does have undiagnosed COPD and this is likely a COPD exacerbation.  Breathing treatments and steroids started.  However he does have mild elevation in his troponin and could benefit from echocardiogram and repeat troponins to rule out ACS.  Will admit to medicine.  This chart was dictated using voice recognition software.  Despite best efforts to proofread,  errors can occur which can change the documentation meaning.    Final Clinical Impression(s) / ED Diagnoses Final diagnoses:  SOB (shortness of breath)  Elevated troponin    Rx / DC Orders ED Discharge Orders     None        Lennice Sites, DO 04/20/21 1425

## 2021-04-21 ENCOUNTER — Observation Stay (HOSPITAL_COMMUNITY): Payer: BC Managed Care – PPO

## 2021-04-21 ENCOUNTER — Observation Stay (HOSPITAL_BASED_OUTPATIENT_CLINIC_OR_DEPARTMENT_OTHER): Payer: BC Managed Care – PPO

## 2021-04-21 DIAGNOSIS — E7800 Pure hypercholesterolemia, unspecified: Secondary | ICD-10-CM

## 2021-04-21 DIAGNOSIS — R0609 Other forms of dyspnea: Secondary | ICD-10-CM | POA: Diagnosis not present

## 2021-04-21 DIAGNOSIS — J441 Chronic obstructive pulmonary disease with (acute) exacerbation: Secondary | ICD-10-CM | POA: Diagnosis not present

## 2021-04-21 DIAGNOSIS — J439 Emphysema, unspecified: Secondary | ICD-10-CM | POA: Diagnosis not present

## 2021-04-21 DIAGNOSIS — R778 Other specified abnormalities of plasma proteins: Secondary | ICD-10-CM

## 2021-04-21 DIAGNOSIS — F17200 Nicotine dependence, unspecified, uncomplicated: Secondary | ICD-10-CM | POA: Diagnosis not present

## 2021-04-21 DIAGNOSIS — I700 Atherosclerosis of aorta: Secondary | ICD-10-CM | POA: Diagnosis not present

## 2021-04-21 DIAGNOSIS — R0602 Shortness of breath: Secondary | ICD-10-CM

## 2021-04-21 DIAGNOSIS — E1169 Type 2 diabetes mellitus with other specified complication: Secondary | ICD-10-CM

## 2021-04-21 DIAGNOSIS — IMO0001 Reserved for inherently not codable concepts without codable children: Secondary | ICD-10-CM

## 2021-04-21 DIAGNOSIS — I10 Essential (primary) hypertension: Secondary | ICD-10-CM

## 2021-04-21 DIAGNOSIS — R0600 Dyspnea, unspecified: Secondary | ICD-10-CM | POA: Diagnosis not present

## 2021-04-21 LAB — ECHOCARDIOGRAM COMPLETE
Area-P 1/2: 2.83 cm2
Height: 69 in
S' Lateral: 2.9 cm
Weight: 2574.97 oz

## 2021-04-21 MED ORDER — DOLUTEGRAVIR SODIUM 50 MG PO TABS
50.0000 mg | ORAL_TABLET | Freq: Every day | ORAL | Status: DC
  Administered 2021-04-21: 50 mg via ORAL
  Filled 2021-04-21: qty 1

## 2021-04-21 MED ORDER — CARVEDILOL 3.125 MG PO TABS
3.1250 mg | ORAL_TABLET | Freq: Two times a day (BID) | ORAL | Status: DC
  Administered 2021-04-21: 3.125 mg via ORAL

## 2021-04-21 MED ORDER — TECHNETIUM TC 99M TETROFOSMIN IV KIT
28.5000 | PACK | Freq: Once | INTRAVENOUS | Status: AC | PRN
  Administered 2021-04-21: 28.5 via INTRAVENOUS

## 2021-04-21 MED ORDER — ASPIRIN 81 MG PO CHEW
CHEWABLE_TABLET | ORAL | Status: AC
  Filled 2021-04-21: qty 1

## 2021-04-21 MED ORDER — ASPIRIN 81 MG PO CHEW: 81.0000 mg | CHEWABLE_TABLET | Freq: Every day | ORAL | 0 refills | Status: AC

## 2021-04-21 MED ORDER — REGADENOSON 0.4 MG/5ML IV SOLN
INTRAVENOUS | Status: AC
  Filled 2021-04-21: qty 5

## 2021-04-21 MED ORDER — DOXYCYCLINE HYCLATE 100 MG PO TABS: 100.0000 mg | ORAL_TABLET | Freq: Two times a day (BID) | ORAL | 0 refills | Status: DC

## 2021-04-21 MED ORDER — ROSUVASTATIN CALCIUM 10 MG PO TABS: 10.0000 mg | ORAL_TABLET | Freq: Every day | ORAL | 0 refills | Status: DC

## 2021-04-21 MED ORDER — DARUN-COBIC-EMTRICIT-TENOFAF 800-150-200-10 MG PO TABS
1.0000 | ORAL_TABLET | Freq: Every day | ORAL | Status: DC
  Filled 2021-04-21: qty 1

## 2021-04-21 MED ORDER — REGADENOSON 0.4 MG/5ML IV SOLN
0.4000 mg | Freq: Once | INTRAVENOUS | Status: AC
  Administered 2021-04-21: 0.4 mg via INTRAVENOUS
  Filled 2021-04-21: qty 5

## 2021-04-21 MED ORDER — CARVEDILOL 3.125 MG PO TABS
ORAL_TABLET | ORAL | Status: AC
  Filled 2021-04-21: qty 1

## 2021-04-21 MED ORDER — AMLODIPINE BESYLATE 10 MG PO TABS
10.0000 mg | ORAL_TABLET | Freq: Every day | ORAL | Status: DC
  Administered 2021-04-21: 10 mg via ORAL

## 2021-04-21 MED ORDER — TECHNETIUM TC 99M TETROFOSMIN IV KIT
8.2000 | PACK | Freq: Once | INTRAVENOUS | Status: AC | PRN
  Administered 2021-04-21: 8.2 via INTRAVENOUS

## 2021-04-21 MED ORDER — DARUN-COBIC-EMTRICIT-TENOFAF 800-150-200-10 MG PO TABS: 2.0000 | ORAL_TABLET | Freq: Every day | ORAL | Status: DC

## 2021-04-21 MED ORDER — ROSUVASTATIN CALCIUM 5 MG PO TABS
10.0000 mg | ORAL_TABLET | Freq: Every day | ORAL | Status: DC
  Administered 2021-04-21: 10 mg via ORAL

## 2021-04-21 MED ORDER — ASPIRIN 81 MG PO CHEW
81.0000 mg | CHEWABLE_TABLET | Freq: Every day | ORAL | Status: DC
  Administered 2021-04-21: 81 mg via ORAL

## 2021-04-21 MED ORDER — CARVEDILOL 3.125 MG PO TABS: 3.1250 mg | ORAL_TABLET | Freq: Two times a day (BID) | ORAL | 0 refills | Status: DC

## 2021-04-21 MED ORDER — PREDNISONE 20 MG PO TABS: 20.0000 mg | ORAL_TABLET | Freq: Every day | ORAL | Status: DC

## 2021-04-21 MED ORDER — AMLODIPINE BESYLATE 10 MG PO TABS
ORAL_TABLET | ORAL | Status: AC
  Filled 2021-04-21: qty 1

## 2021-04-21 MED ORDER — AMLODIPINE BESYLATE 10 MG PO TABS: 10.0000 mg | ORAL_TABLET | Freq: Every day | ORAL | 0 refills | Status: DC

## 2021-04-21 MED ORDER — IPRATROPIUM-ALBUTEROL 0.5-2.5 (3) MG/3ML IN SOLN: 3.0000 mL | Freq: Four times a day (QID) | RESPIRATORY_TRACT | Status: DC

## 2021-04-21 MED ORDER — ROSUVASTATIN CALCIUM 5 MG PO TABS
ORAL_TABLET | ORAL | Status: AC
  Filled 2021-04-21: qty 2

## 2021-04-21 NOTE — Discharge Summary (Signed)
Ronald Castaneda SHF:[id number] DOB: 1955-12-25 DOA: 04/20/2021  PCP: Hoyt Koch, MD  Admit date: 04/20/2021  Discharge date: 04/21/2021  Admitted From: Home   Disposition:  Home   Recommendations for Outpatient Follow-up:   Follow up with PCP in 1-2 weeks  PCP Please obtain BMP/CBC, 2 view CXR in 1week,  (see Discharge instructions)   PCP Please follow up on the following pending results:    Home Health: None   Equipment/Devices: None  Consultations: Cards Discharge Condition: Stable    CODE STATUS: Full    Diet Recommendation: Heart Healthy   Diet Order             Diet Heart Room service appropriate? Yes; Fluid consistency: Thin  Diet effective now                    Chief Complaint  Patient presents with   Shortness of Breath     Brief history of present illness from the day of admission and additional interim summary    Ronald Castaneda is a 65 y.o. male with medical history significant of HIV, depression, tobacco use presented to the ED complaining of exertional shortness of breath creased fatigue for the last 1 week, no previous history of asthma allergies or COPD.  In the ER he was diagnosed with hypertensive urgency, possible COPD exacerbation and admitted                                                                 Hospital Course    Exertional shortness of breath. - in a patient with history of uncontrolled hypertension, ongoing smoking and HIV.  Highly suspicious for CAD, no wheezing on exam no previous history of COPD hence I do not think he has COPD exacerbation.  Taper off steroids quickly.  Taper of doxycycline quickly.  He has been extensively counseled to quit smoking, Stable Echo and Stress test, symptom free and cleared by Cards for Home DC.  His troponin trend has been  flat and in non-ACS pattern, for now we will place him on oral aspirin, beta-blocker and statin for secondary prevention. Symptoms most likely due to #2 below.   2.  Hypertensive urgency.  Placed on Norvasc and Coreg.   3.  Smoking.  Counseled to quit.   4.  HIV.  On home medications follow with ID outpatient postdischarge   5.  Nonspecific lung findings on CTA - symptom-free currently, no fever or leukocytosis, chest x-ray repeated this morning stable.  Follow-up with PCP outpatient for two-view chest x-ray in 7 to 10 days     Discharge diagnosis     Principal Problem:   COPD exacerbation ([place]) Active Problems:   Tobacco abuse   HIV disease ([place])   Elevated blood pressure reading   Elevated  troponin   Dyspnea on exertion   Atherosclerosis of aorta ([facility])   Hypercholesteremia   Benign hypertension   Smoking    Discharge instructions    Discharge Instructions     Discharge instructions   Complete by: As directed    Follow with Primary MD Hoyt Koch, MD in 7 days   Get CBC, CMP, 2 view Chest X ray -  checked next visit within 1 week by Primary MD   Activity: As tolerated with Full fall precautions use walker/cane & assistance as needed  Disposition Home    Diet: Heart Healthy    Special Instructions: If you have smoked or chewed Tobacco  in the last 2 yrs please stop smoking, stop any regular Alcohol  and or any Recreational drug use.  On your next visit with your primary care physician please Get Medicines reviewed and adjusted.  Please request your Prim.MD to go over all Hospital Tests and Procedure/Radiological results at the follow up, please get all Hospital records sent to your Prim MD by signing hospital release before you go home.  If you experience worsening of your admission symptoms, develop shortness of breath, life threatening emergency, suicidal or homicidal thoughts you must seek medical attention immediately by calling 911 or calling your  MD immediately  if symptoms less severe.  You Must read complete instructions/literature along with all the possible adverse reactions/side effects for all the Medicines you take and that have been prescribed to you. Take any new Medicines after you have completely understood and accpet all the possible adverse reactions/side effects.   Increase activity slowly   Complete by: As directed        Discharge Medications   Allergies as of 04/21/2021   No Known Allergies      Medication List     STOP taking these medications    BC HEADACHE POWDER PO       TAKE these medications    amLODipine 10 MG tablet Commonly known as: NORVASC Take 1 tablet (10 mg total) by mouth daily. Start taking on: April 22, 2021   aspirin 81 MG chewable tablet Chew 1 tablet (81 mg total) by mouth daily. Start taking on: April 22, 2021   carvedilol 3.125 MG tablet Commonly known as: COREG Take 1 tablet (3.125 mg total) by mouth 2 (two) times daily with a meal.   doxycycline 100 MG tablet Commonly known as: VIBRA-TABS Take 1 tablet (100 mg total) by mouth every 12 (twelve) hours.   Dristan Spray 0.05 % nasal spray Generic drug: oxymetazoline Place 1 spray into both nostrils 2 (two) times daily as needed for congestion.   rosuvastatin 10 MG tablet Commonly known as: CRESTOR Take 1 tablet (10 mg total) by mouth daily. Start taking on: April 22, 2021   Symtuza 800-150-200-10 MG Tabs Generic drug: Darunavir-Cobicistat-Emtricitabine-Tenofovir Alafenamide TAKE 1 TABLET BY MOUTH DAILY WITH BREAKFAST.   Tivicay 50 MG tablet Generic drug: dolutegravir TAKE 1 TABLET (50 MG TOTAL) BY MOUTH DAILY. What changed: how much to take         Follow-up Information     Hoyt Koch, MD. Schedule an appointment as soon as possible for a visit in 1 week(s).   Specialty: Internal Medicine Contact information: [place] [address] [phone]          Rex Kras, DO. Schedule an appointment as soon as possible for a visit on 05/29/2021.   Specialties: Cardiology, Vascular Surgery Why: at 130pm. Contact  information: [place] [address] [phone]                 Major procedures and Radiology Reports - PLEASE review detailed and final reports thoroughly  -       DG Chest 2 View  Result Date: 04/20/2021 CLINICAL DATA:  Shortness of breath and cough beginning a week ago. EXAM: CHEST - 2 VIEW COMPARISON:  04/17/2021 FINDINGS: Heart size is normal. Aortic atherosclerotic calcification is visible. There is background emphysema but no evidence of acute infiltrate, collapse or effusion. No acute bone finding. IMPRESSION: Background emphysema.  No sign of acute infiltrate or collapse. Aortic atherosclerotic calcification. Electronically Signed   By: Nelson Chimes M.D.   On: 04/20/2021 11:40   CT Angio Chest PE W and/or Wo Contrast  Result Date: 04/20/2021 CLINICAL DATA:  Shortness of breath, cough EXAM: CT ANGIOGRAPHY CHEST WITH CONTRAST TECHNIQUE: Multidetector CT imaging of the chest was performed using the standard protocol during bolus administration of intravenous contrast. Multiplanar CT image reconstructions and MIPs were obtained to evaluate the vascular anatomy. CONTRAST:  59mL OMNIPAQUE IOHEXOL 350 MG/ML SOLN COMPARISON:  None. FINDINGS: Cardiovascular: Satisfactory opacification of the pulmonary arteries to the segmental level. No evidence of pulmonary embolism. Normal heart size. No pericardial effusion. Aortic atherosclerosis. Mediastinum/Nodes: No enlarged mediastinal, hilar, or axillary lymph nodes. Thyroid gland, trachea, and esophagus demonstrate no significant findings. Lungs/Pleura: Moderate centrilobular and paraseptal emphysema. Diffuse bilateral bronchial wall thickening. Elevation of the left hemidiaphragm with minimal associated scarring and ground-glass. No pleural effusion or pneumothorax.  Upper Abdomen: No acute abnormality. Colonic diverticula. Benign adenomatous thickening of the adrenal glands. Musculoskeletal: No chest wall abnormality. No acute or significant osseous findings. Review of the MIP images confirms the above findings. IMPRESSION: 1. Negative examination for pulmonary embolism. 2. Elevation of the left hemidiaphragm with minimal associated scarring and ground-glass, most likely sequelae of prior infection or aspiration, which may be ongoing. 3. Emphysema and diffuse bilateral bronchial wall thickening. Aortic Atherosclerosis (ICD10-I70.0) and Emphysema (ICD10-J43.9). Electronically Signed   By: Delanna Ahmadi M.D.   On: 04/20/2021 12:33   NM Myocar Multi W/Spect W/Wall Motion / EF  Result Date: 04/21/2021 CLINICAL DATA:  65 year old male with history of dyspnea on exertion. EXAM: MYOCARDIAL IMAGING WITH SPECT (REST AND EXERCISE) GATED LEFT VENTRICULAR WALL MOTION STUDY LEFT VENTRICULAR EJECTION FRACTION TECHNIQUE: Standard myocardial SPECT imaging was performed after resting intravenous injection of 8.2 mCi Tc-94m sestamibi. Subsequently, exercise tolerance test was performed by the patient under the supervision of the Cardiology staff. At peak-stress, 28.5 mCi Tc-40m sestamibiwas injected intravenously and standard myocardial SPECT imaging was performed. Quantitative gated imaging was also performed to evaluate left ventricular wall motion, and estimate left ventricular ejection fraction. COMPARISON:  None. FINDINGS: Perfusion: No decreased activity in the left ventricle on stress imaging to suggest reversible ischemia or infarction. Wall Motion: Normal left ventricular wall motion. No left ventricular dilation. Left Ventricular Ejection Fraction: 62 % End diastolic volume 69 ml End systolic volume 26 ml IMPRESSION: 1. No reversible ischemia or infarction. 2. Normal left ventricular wall motion. 3. Left ventricular ejection fraction 62% 4. Non invasive risk stratification*: Low  *2012 Appropriate Use Criteria for Coronary Revascularization Focused Update: J Am Coll Cardiol. 0254;27(0):623-762. http://content.airportbarriers.com.aspx?articleid=1201161 Electronically Signed   By: Ruthann Cancer M.D.   On: 04/21/2021 16:52   DG Chest Port 1 View  Result Date: 04/21/2021 CLINICAL DATA:  65 year old smoker presenting with shortness of breath. EXAM: PORTABLE CHEST  1 VIEW COMPARISON:  04/20/2021 and earlier, including CTA chest 04/20/2021. FINDINGS: Cardiac silhouette normal in size, unchanged. Thoracic aorta mildly atherosclerotic. Hilar and mediastinal contours otherwise unremarkable. Emphysematous changes throughout both lungs which remain clear. No visible pleural effusions. IMPRESSION: 1. No acute cardiopulmonary disease. 2.  Emphysema. (JJK09-F81.9) Electronically Signed   By: Evangeline Dakin M.D.   On: 04/21/2021 08:28   DG Chest Port 1 View  Result Date: 04/17/2021 CLINICAL DATA:  Shortness of breath EXAM: PORTABLE CHEST 1 VIEW COMPARISON:  None. FINDINGS: The heart size and mediastinal contours are within normal limits. Mild bibasilar opacities, likely due to atelectasis. Lungs otherwise clear. No large pleural effusion or pneumothorax. The visualized skeletal structures are unremarkable. IMPRESSION: No active disease. Electronically Signed   By: Yetta Glassman M.D.   On: 04/17/2021 13:51   ECHOCARDIOGRAM COMPLETE  Result Date: 04/21/2021    ECHOCARDIOGRAM REPORT   Patient Name:   [facility] Date of Exam: 04/21/2021 Medical Rec #:  [id number]     Height:       69.0 in Accession #:    [id number]    Weight:       160.9 lb Date of Birth:  1956-05-13     BSA:          1.884 m Patient Age:    66 years      BP:           167/97 mmHg Patient Gender: M             HR:           64 bpm. Exam Location:  Inpatient Procedure: 2D Echo, Cardiac Doppler and Color Doppler Indications:    Elevated Troponin  History:        Patient has no prior history of Echocardiogram examinations.                  Risk Factors:Current Smoker.  Sonographer:    Bernadene Person RDCS Referring Phys: [id number] [facility] RATHORE  Sonographer Comments: Image acquisition challenging due to respiratory motion. IMPRESSIONS  1. Left ventricular ejection fraction, by estimation, is 60 to 65%. The left ventricle has normal function. The left ventricle has no regional wall motion abnormalities. Left ventricular diastolic parameters were normal.  2. Right ventricular systolic function is normal. The right ventricular size is normal. Tricuspid regurgitation signal is inadequate for assessing PA pressure.  3. The mitral valve is grossly normal. Trivial mitral valve regurgitation. No evidence of mitral stenosis.  4. The aortic valve is tricuspid. Aortic valve regurgitation is not visualized. No aortic stenosis is present.  5. The inferior vena cava is normal in size with greater than 50% respiratory variability, suggesting right atrial pressure of 3 mmHg. Conclusion(s)/Recommendation(s): Normal biventricular function without evidence of hemodynamically significant valvular heart disease. FINDINGS  Left Ventricle: Left ventricular ejection fraction, by estimation, is 60 to 65%. The left ventricle has normal function. The left ventricle has no regional wall motion abnormalities. The left ventricular internal cavity size was normal in size. There is  no left ventricular hypertrophy. Left ventricular diastolic parameters were normal. Right Ventricle: The right ventricular size is normal. No increase in right ventricular wall thickness. Right ventricular systolic function is normal. Tricuspid regurgitation signal is inadequate for assessing PA pressure. Left Atrium: Left atrial size was normal in size. Right Atrium: Right atrial size was normal in size. Pericardium: There is no evidence of pericardial effusion. Mitral Valve: The mitral valve is grossly normal. Trivial mitral valve regurgitation.  No evidence of mitral valve stenosis.  Tricuspid Valve: The tricuspid valve is grossly normal. Tricuspid valve regurgitation is trivial. No evidence of tricuspid stenosis. Aortic Valve: The aortic valve is tricuspid. Aortic valve regurgitation is not visualized. No aortic stenosis is present. Pulmonic Valve: The pulmonic valve was grossly normal. Pulmonic valve regurgitation is mild. No evidence of pulmonic stenosis. Aorta: The aortic root and ascending aorta are structurally normal, with no evidence of dilitation. Venous: The inferior vena cava is normal in size with greater than 50% respiratory variability, suggesting right atrial pressure of 3 mmHg. IAS/Shunts: The atrial septum is grossly normal.  LEFT VENTRICLE PLAX 2D LVIDd:         4.20 cm   Diastology LVIDs:         2.90 cm   LV e' medial:    7.83 cm/s LV PW:         0.80 cm   LV E/e' medial:  7.3 LV IVS:        0.80 cm   LV e' lateral:   9.03 cm/s LVOT diam:     2.10 cm   LV E/e' lateral: 6.3 LV SV:         69 LV SV Index:   37 LVOT Area:     3.46 cm  RIGHT VENTRICLE RV S prime:     11.00 cm/s TAPSE (M-mode): 2.0 cm LEFT ATRIUM             Index        RIGHT ATRIUM           Index LA diam:        3.60 cm 1.91 cm/m   RA Area:     17.80 cm LA Vol (A2C):   34.0 ml 18.05 ml/m  RA Volume:   45.10 ml  23.94 ml/m LA Vol (A4C):   31.1 ml 16.51 ml/m LA Biplane Vol: 32.8 ml 17.41 ml/m  AORTIC VALVE             PULMONIC VALVE LVOT Vmax:   108.00 cm/s PR End Diast Vel: 5.02 msec LVOT Vmean:  64.500 cm/s LVOT VTI:    0.199 m  AORTA Ao Root diam: 3.30 cm Ao Asc diam:  3.10 cm MITRAL VALVE MV Area (PHT): 2.83 cm    SHUNTS MV Decel Time: 268 msec    Systemic VTI:  0.20 m MV E velocity: 57.20 cm/s  Systemic Diam: 2.10 cm MV A velocity: 68.90 cm/s MV E/A ratio:  0.83 Eleonore Chiquito MD Electronically signed by Eleonore Chiquito MD Signature Date/Time: 04/21/2021/11:38:47 AM    Final        Today   Subjective    Ronald Castaneda today has no headache,no chest abdominal pain,no new weakness tingling or  numbness, feels much better wants to go home today.    Objective   Blood pressure 126/89, pulse 87, temperature 98.3 F (36.8 C), temperature source Axillary, resp. rate 20, height 5\' 9"  (1.753 m), weight 73 kg, SpO2 93 %.   Exam  Awake Alert, No new F.N deficits, Normal affect [place].AT,PERRAL Supple Neck,No JVD, No cervical lymphadenopathy appriciated.  Symmetrical Chest wall movement, Good air movement bilaterally, CTAB RRR,No Gallops,Rubs or new Murmurs, No Parasternal Heave +ve B.Sounds, Abd Soft, Non tender, No organomegaly appriciated, No rebound -guarding or rigidity. No Cyanosis, Clubbing or edema, No new Rash or bruise   Data Review   CBC w Diff:  Lab Results  Component Value Date   WBC 9.3 04/20/2021  HGB 16.3 04/20/2021   HCT 46.6 04/20/2021   PLT 237 04/20/2021   LYMPHOPCT 28 04/20/2021   MONOPCT 8 04/20/2021   EOSPCT 1 04/20/2021   BASOPCT 0 04/20/2021    CMP:  Lab Results  Component Value Date   NA 139 04/20/2021   K 3.6 04/20/2021   CL 104 04/20/2021   CO2 26 04/20/2021   BUN 27 (H) 04/20/2021   CREATININE 0.97 04/20/2021   CREATININE 1.10 09/24/2020   PROT 7.7 04/20/2021   ALBUMIN 4.0 04/20/2021   BILITOT 0.5 04/20/2021   ALKPHOS 82 04/20/2021   AST 33 04/20/2021   ALT 23 04/20/2021  .   Total Time in preparing paper work, data evaluation and todays exam - 16 minutes  Lala Lund M.D on 04/21/2021 at 5:19 PM  [facility]

## 2021-04-21 NOTE — Progress Notes (Signed)
PROGRESS NOTE                                                                                                                                                                                                             Patient Demographics:    Ronald Castaneda, is a 65 y.o. male, DOB - 01-25-1956, BSJ:[id number]  Outpatient Primary MD for the patient is Hoyt Koch, MD    LOS - 0  Admit date - 04/20/2021    Chief Complaint  Patient presents with   Shortness of Breath       Brief Narrative (HPI from H&P)  -  Ronald Castaneda is a 65 y.o. male with medical history significant of HIV, depression, tobacco use presented to the ED complaining of exertional shortness of breath creased fatigue for the last 1 week, no previous history of asthma allergies or COPD.  In the ER he was diagnosed with hypertensive urgency, possible COPD exacerbation and admitted   Subjective:    Ronald Castaneda today has, No headache, No chest pain, No abdominal pain - No Nausea, No new weakness tingling or numbness, no cough or shortness of breath at rest, no wheezing, he does get short of breath on exertion.   Assessment  & Plan :      Exertional shortness of breath. - in a patient with history of uncontrolled hypertension, ongoing smoking and HIV.  Highly suspicious for CAD, no wheezing on exam no previous history of COPD hence I do not think he has COPD exacerbation.  Taper off steroids quickly.  Taper of doxycycline quickly.  He has been extensively counseled to quit smoking, obtain echocardiogram to look at [facility] and wall motion have consulted cardiology for either inpatient or outpatient stress test based on his echo results.  His troponin trend has been flat and in non-ACS pattern, for now we will place him on oral aspirin, beta-blocker and statin for secondary prevention.  2.  Hypertensive urgency.  Placed on Norvasc and Coreg.  3.  Smoking.   Counseled to quit.  4.  HIV.  On home medications follow with ID outpatient postdischarge  5.  Nonspecific lung findings on CTA - symptom-free currently, no fever or leukocytosis, chest x-ray repeated this morning stable.  Follow-up with PCP outpatient for two-view chest x-ray in 7 to 10 days  Condition - Extremely Guarded  Family Communication  :  wife  Code Status :  Full  Consults  :  Cards  PUD Prophylaxis :    Procedures  :     CTA - 1. Negative examination for pulmonary embolism. 2. Elevation of the left hemidiaphragm with minimal associated scarring and ground-glass, most likely sequelae of prior infection or aspiration, which may be ongoing. 3. Emphysema and diffuse bilateral bronchial wall thickening. Aortic Atherosclerosis (ICD10-I70.0) and Emphysema (ICD10-J43.9).  TTE      Disposition Plan  :    Status is: Observation  DVT Prophylaxis  :    enoxaparin (LOVENOX) injection 40 mg Start: 04/20/21 2100    Lab Results  Component Value Date   PLT 237 04/20/2021    Diet :  Diet Order             Diet Heart Room service appropriate? Yes; Fluid consistency: Thin  Diet effective now                    Inpatient Medications  Scheduled Meds:  amLODipine  10 mg Oral Daily   aspirin  81 mg Oral Daily   carvedilol  3.125 mg Oral BID WC   [START ON 04/22/2021] Darunavir-Cobicistat-Emtricitabine-Tenofovir Alafenamide  2 tablet Oral Q breakfast   dolutegravir  50 mg Oral Daily   doxycycline  100 mg Oral Q12H   enoxaparin (LOVENOX) injection  40 mg Subcutaneous Q24H   guaiFENesin  600 mg Oral BID   [START ON 04/22/2021] ipratropium-albuterol  3 mL Nebulization Q6H WA   nicotine  14 mg Transdermal Daily   [START ON 04/22/2021] predniSONE  20 mg Oral Q breakfast   rosuvastatin  10 mg Oral Daily   Continuous Infusions: PRN Meds:.acetaminophen **OR** acetaminophen, albuterol, hydrALAZINE  Antibiotics  :    Anti-infectives (From admission, onward)     Start     Dose/Rate Route Frequency Ordered Stop   04/22/21 0800  Darunavir-Cobicistat-Emtricitabine-Tenofovir Alafenamide (SYMTUZA) 800-150-200-10 MG TABS 2 tablet        2 tablet Oral Daily with breakfast 04/21/21 1012     04/21/21 1100  dolutegravir (TIVICAY) tablet 50 mg        50 mg Oral Daily 04/21/21 1012     04/20/21 2200  doxycycline (VIBRA-TABS) tablet 100 mg        100 mg Oral Every 12 hours 04/20/21 2002          Time Spent in minutes  30   Lala Lund M.D on 04/21/2021 at 10:41 AM  To page go to [email or web address]   [facility] -  Office  [phone]  See all Orders from today for further details    Objective:   Vitals:   04/20/21 1900 04/21/21 0024 04/21/21 0513 04/21/21 0720  BP: (!) 159/98 (!) 162/88 (!) 149/96 (!) 167/97  Pulse: 73 74 (!) 109 62  Resp:   16 17  Temp: 98.4 F (36.9 C) 98.5 F (36.9 C) 98.6 F (37 C) 98.5 F (36.9 C)  TempSrc: Oral   Oral  SpO2: 97% 98% 93% 95%  Weight:      Height:        Wt Readings from Last 3 Encounters:  04/20/21 73 kg  04/17/21 73 kg  04/09/21 72.1 kg    No intake or output data in the 24 hours ending 04/21/21 1041   Physical Exam  Awake Alert, No new F.N deficits, Normal affect Ronald Castaneda.AT,PERRAL Supple Neck, No JVD,  Symmetrical Chest wall movement, Good air movement bilaterally, CTAB RRR,No Gallops,Rubs or new Murmurs,  +ve B.Sounds, Abd Soft, No tenderness,   No Cyanosis, Clubbing or edema      Data Review:    CBC Recent Labs  Lab 04/17/21 1332 04/20/21 1107  WBC 8.9 9.3  HGB 15.2 16.3  HCT 44.1 46.6  PLT 231 237  MCV 80.5 79.4*  MCH 27.7 27.8  MCHC 34.5 35.0  RDW 15.8* 16.2*  LYMPHSABS 3.0 2.6  MONOABS 0.8 0.8  EOSABS 0.2 0.1  BASOSABS 0.1 0.0    Electrolytes Recent Labs  Lab 04/17/21 1332 04/20/21 1107  NA 138 139  K 4.1 3.6  CL 102 104  CO2 29 26  GLUCOSE 99 122*  BUN 16 27*  CREATININE 0.77 0.97  CALCIUM 9.1 8.8*  AST  --  33  ALT  --  23  ALKPHOS  --   82  BILITOT  --  0.5  ALBUMIN  --  4.0  BNP  --  46.8    ------------------------------------------------------------------------------------------------------------------ No results for input(s): CHOL, HDL, LDLCALC, TRIG, CHOLHDL, LDLDIRECT in the last 72 hours.  Lab Results  Component Value Date   HGBA1C 6.2 02/20/2015    No results for input(s): TSH, T4TOTAL, T3FREE, THYROIDAB in the last 72 hours.  Invalid input(s): FREET3 ------------------------------------------------------------------------------------------------------------------ ID Labs Recent Labs  Lab 04/17/21 1332 04/20/21 1107  WBC 8.9 9.3  PLT 231 237  CREATININE 0.77 0.97   Cardiac Enzymes No results for input(s): CKMB, TROPONINI, MYOGLOBIN in the last 168 hours.  Invalid input(s): CK   Radiology Reports DG Chest 2 View  Result Date: 04/20/2021 CLINICAL DATA:  Shortness of breath and cough beginning a week ago. EXAM: CHEST - 2 VIEW COMPARISON:  04/17/2021 FINDINGS: Heart size is normal. Aortic atherosclerotic calcification is visible. There is background emphysema but no evidence of acute infiltrate, collapse or effusion. No acute bone finding. IMPRESSION: Background emphysema.  No sign of acute infiltrate or collapse. Aortic atherosclerotic calcification. Electronically Signed   By: Nelson Chimes M.D.   On: 04/20/2021 11:40   CT Angio Chest PE W and/or Wo Contrast  Result Date: 04/20/2021 CLINICAL DATA:  Shortness of breath, cough EXAM: CT ANGIOGRAPHY CHEST WITH CONTRAST TECHNIQUE: Multidetector CT imaging of the chest was performed using the standard protocol during bolus administration of intravenous contrast. Multiplanar CT image reconstructions and MIPs were obtained to evaluate the vascular anatomy. CONTRAST:  44mL OMNIPAQUE IOHEXOL 350 MG/ML SOLN COMPARISON:  None. FINDINGS: Cardiovascular: Satisfactory opacification of the pulmonary arteries to the segmental level. No evidence of pulmonary embolism.  Normal heart size. No pericardial effusion. Aortic atherosclerosis. Mediastinum/Nodes: No enlarged mediastinal, hilar, or axillary lymph nodes. Thyroid gland, trachea, and esophagus demonstrate no significant findings. Lungs/Pleura: Moderate centrilobular and paraseptal emphysema. Diffuse bilateral bronchial wall thickening. Elevation of the left hemidiaphragm with minimal associated scarring and ground-glass. No pleural effusion or pneumothorax. Upper Abdomen: No acute abnormality. Colonic diverticula. Benign adenomatous thickening of the adrenal glands. Musculoskeletal: No chest wall abnormality. No acute or significant osseous findings. Review of the MIP images confirms the above findings. IMPRESSION: 1. Negative examination for pulmonary embolism. 2. Elevation of the left hemidiaphragm with minimal associated scarring and ground-glass, most likely sequelae of prior infection or aspiration, which may be ongoing. 3. Emphysema and diffuse bilateral bronchial wall thickening. Aortic Atherosclerosis (ICD10-I70.0) and Emphysema (ICD10-J43.9). Electronically Signed   By: Delanna Ahmadi M.D.   On: 04/20/2021 12:33   DG Chest [facility] 1 View  Result  Date: 04/21/2021 CLINICAL DATA:  65 year old smoker presenting with shortness of breath. EXAM: PORTABLE CHEST 1 VIEW COMPARISON:  04/20/2021 and earlier, including CTA chest 04/20/2021. FINDINGS: Cardiac silhouette normal in size, unchanged. Thoracic aorta mildly atherosclerotic. Hilar and mediastinal contours otherwise unremarkable. Emphysematous changes throughout both lungs which remain clear. No visible pleural effusions. IMPRESSION: 1. No acute cardiopulmonary disease. 2.  Emphysema. (UIQ79-V87.9) Electronically Signed   By: Evangeline Dakin M.D.   On: 04/21/2021 08:28   DG Chest Port 1 View  Result Date: 04/17/2021 CLINICAL DATA:  Shortness of breath EXAM: PORTABLE CHEST 1 VIEW COMPARISON:  None. FINDINGS: The heart size and mediastinal contours are within normal  limits. Mild bibasilar opacities, likely due to atelectasis. Lungs otherwise clear. No large pleural effusion or pneumothorax. The visualized skeletal structures are unremarkable. IMPRESSION: No active disease. Electronically Signed   By: Yetta Glassman M.D.   On: 04/17/2021 13:51

## 2021-04-21 NOTE — Consult Note (Addendum)
CARDIOLOGY CONSULT NOTE  Patient ID: Ronald Castaneda MRN: [id number] DOB/AGE: Sep 22, 1955 65 y.o.  Admit date: 04/20/2021 Attending physician: Thurnell Lose, MD Primary Physician:  Hoyt Koch, MD Outpatient Cardiologist: NA Inpatient Cardiologist: Rex Kras, DO, [facility]  Reason of consultation: Chest Pain and Shortness of breath Referring physician: Thurnell Lose, MD  Chief complaint: Shortness of breath  HPI:  Ronald Castaneda is a 65 y.o. African-American male who presents with a chief complaint of " shortness of breath." His past medical history and cardiovascular risk factors include: Tobacco use, hypertension, HIV, hyperlipidemia, aortic atherosclerosis.  Recent ER visit for URI symptoms on 04/17/2021 tested negative for COVID 19 infection and later discharged home.  Presents back to the ED on 04/20/2021 with complaint of shortness of breath predominantly with effort related activities.  Patient has been admitted to medicine and cardiology is consulted for evaluation of dyspnea on exertion.  EKG shows normal sinus rhythm without underlying ischemia.  High sensitive troponin peaked at 66, essentially flat.  BNP 46.8.  CT PE protocol negative for pulmonary embolism.  Findings suggestive of emphysema and aortic atherosclerosis.  Patient is also newly diagnosed with benign essential hypertension currently being managed by primary team.  No family history of premature CAD.  Smoking 1 pack/day for the last 15 years.  Social consumption of alcohol.  ALLERGIES: No Known Allergies  PAST MEDICAL HISTORY: Past Medical History:  Diagnosis Date   Depression    HIV infection ([place])    Tobacco use     PAST SURGICAL HISTORY: Past Surgical History:  Procedure Laterality Date   None      FAMILY HISTORY: The patient's family history includes Diabetes type I in his daughter; Healthy in his brother and sister; Hypertension in his mother.   SOCIAL HISTORY:  The patient   reports that he has been smoking cigarettes. He has a 23.00 pack-year smoking history. He has never used smokeless tobacco. He reports current alcohol use of about 6.0 standard drinks per week. He reports that he does not use drugs.  MEDICATIONS: Current Outpatient Medications  Medication Instructions   Aspirin-Salicylamide-Caffeine (BC HEADACHE POWDER PO) 1 packet, Oral, Every 6 hours PRN   Darunavir-Cobicistat-Emtricitabine-Tenofovir Alafenamide (SYMTUZA) 800-150-200-10 MG TABS TAKE 1 TABLET BY MOUTH DAILY WITH BREAKFAST.   dolutegravir (TIVICAY) 50 MG tablet TAKE 1 TABLET (50 MG TOTAL) BY MOUTH DAILY.    REVIEW OF SYSTEMS: Review of Systems  Constitutional: Negative for chills and fever.  HENT:  Negative for hoarse voice and nosebleeds.   Eyes:  Negative for discharge, double vision and pain.  Cardiovascular:  Positive for dyspnea on exertion. Negative for chest pain, claudication, leg swelling, near-syncope, orthopnea, palpitations, paroxysmal nocturnal dyspnea and syncope.  Respiratory:  Positive for cough and shortness of breath. Negative for hemoptysis.   Musculoskeletal:  Negative for muscle cramps and myalgias.  Gastrointestinal:  Negative for abdominal pain, constipation, diarrhea, hematemesis, hematochezia, melena, nausea and vomiting.  Neurological:  Negative for dizziness and light-headedness.  All other systems reviewed and are negative.  PHYSICAL EXAM: Vitals with BMI 04/21/2021 04/21/2021 04/21/2021  Height - - -  Weight - - -  BMI - - -  Systolic 701 779 390  Diastolic 97 96 88  Pulse 62 109 74    No intake or output data in the 24 hours ending 04/21/21 1026  Net IO Since Admission: No IO data has been entered for this period [04/21/21 1026]  CONSTITUTIONAL: Well-developed and well-nourished. No acute distress.  SKIN: Skin  is warm and dry. No rash noted. No cyanosis. No pallor. No jaundice HEAD: Normocephalic and atraumatic.  EYES: No scleral  icterus MOUTH/THROAT: Moist oral membranes.  NECK: No JVD present. No thyromegaly noted. No carotid bruits  LYMPHATIC: No visible cervical adenopathy.  CHEST Normal respiratory effort. No intercostal retractions  LUNGS: Clear to auscultation bilaterally.  No stridor. No wheezes. No rales.  CARDIOVASCULAR:  Regular rate and rhythm, positive S1-S2, no murmurs rubs or gallops appreciated ABDOMINAL: Soft, nontender, nondistended, positive bowel sounds in all 4 quadrants, no apparent ascites.  EXTREMITIES: No pitting edema, warm to touch.  HEMATOLOGIC: No significant bruising NEUROLOGIC: Oriented to person, place, and time. Nonfocal. Normal muscle tone.  PSYCHIATRIC: Normal mood and affect. Normal behavior. Cooperative  RADIOLOGY: DG Chest 2 View  Result Date: 04/20/2021 CLINICAL DATA:  Shortness of breath and cough beginning a week ago. EXAM: CHEST - 2 VIEW COMPARISON:  04/17/2021 FINDINGS: Heart size is normal. Aortic atherosclerotic calcification is visible. There is background emphysema but no evidence of acute infiltrate, collapse or effusion. No acute bone finding. IMPRESSION: Background emphysema.  No sign of acute infiltrate or collapse. Aortic atherosclerotic calcification. Electronically Signed   By: Nelson Chimes M.D.   On: 04/20/2021 11:40   CT Angio Chest PE W and/or Wo Contrast  Result Date: 04/20/2021 CLINICAL DATA:  Shortness of breath, cough EXAM: CT ANGIOGRAPHY CHEST WITH CONTRAST TECHNIQUE: Multidetector CT imaging of the chest was performed using the standard protocol during bolus administration of intravenous contrast. Multiplanar CT image reconstructions and MIPs were obtained to evaluate the vascular anatomy. CONTRAST:  12mL OMNIPAQUE IOHEXOL 350 MG/ML SOLN COMPARISON:  None. FINDINGS: Cardiovascular: Satisfactory opacification of the pulmonary arteries to the segmental level. No evidence of pulmonary embolism. Normal heart size. No pericardial effusion. Aortic atherosclerosis.  Mediastinum/Nodes: No enlarged mediastinal, hilar, or axillary lymph nodes. Thyroid gland, trachea, and esophagus demonstrate no significant findings. Lungs/Pleura: Moderate centrilobular and paraseptal emphysema. Diffuse bilateral bronchial wall thickening. Elevation of the left hemidiaphragm with minimal associated scarring and ground-glass. No pleural effusion or pneumothorax. Upper Abdomen: No acute abnormality. Colonic diverticula. Benign adenomatous thickening of the adrenal glands. Musculoskeletal: No chest wall abnormality. No acute or significant osseous findings. Review of the MIP images confirms the above findings. IMPRESSION: 1. Negative examination for pulmonary embolism. 2. Elevation of the left hemidiaphragm with minimal associated scarring and ground-glass, most likely sequelae of prior infection or aspiration, which may be ongoing. 3. Emphysema and diffuse bilateral bronchial wall thickening. Aortic Atherosclerosis (ICD10-I70.0) and Emphysema (ICD10-J43.9). Electronically Signed   By: Delanna Ahmadi M.D.   On: 04/20/2021 12:33   DG Chest Port 1 View  Result Date: 04/21/2021 CLINICAL DATA:  65 year old smoker presenting with shortness of breath. EXAM: PORTABLE CHEST 1 VIEW COMPARISON:  04/20/2021 and earlier, including CTA chest 04/20/2021. FINDINGS: Cardiac silhouette normal in size, unchanged. Thoracic aorta mildly atherosclerotic. Hilar and mediastinal contours otherwise unremarkable. Emphysematous changes throughout both lungs which remain clear. No visible pleural effusions. IMPRESSION: 1. No acute cardiopulmonary disease. 2.  Emphysema. (HFW26-V78.9) Electronically Signed   By: Evangeline Dakin M.D.   On: 04/21/2021 08:28    LABORATORY DATA: Lab Results  Component Value Date   WBC 9.3 04/20/2021   HGB 16.3 04/20/2021   HCT 46.6 04/20/2021   MCV 79.4 (L) 04/20/2021   PLT 237 04/20/2021    Recent Labs  Lab 04/20/21 1107  NA 139  K 3.6  CL 104  CO2 26  BUN 27*  CREATININE  0.97  CALCIUM 8.8*  PROT 7.7  BILITOT 0.5  ALKPHOS 82  ALT 23  AST 33  GLUCOSE 122*    Lipid Panel  Lab Results  Component Value Date   CHOL 225 (H) 09/24/2020   HDL 45 09/24/2020   LDLCALC 142 (H) 09/24/2020   TRIG 238 (H) 09/24/2020   CHOLHDL 5.0 (H) 09/24/2020    BNP (last 3 results) Recent Labs    04/20/21 1107  BNP 46.8    HEMOGLOBIN A1C Lab Results  Component Value Date   HGBA1C 6.2 02/20/2015    Cardiac Panel (last 3 results) Recent Labs    04/20/21 1107 04/20/21 1258 04/20/21 1854  TROPONINIHS 22* 66* 64*     TSH No results for input(s): TSH in the last 8760 hours.   CARDIAC DATABASE: EKG: 04/17/2021: Sinus tachycardia, 105 bpm, without underlying injury pattern.  04/20/2021: Normal sinus rhythm, 78 bpm, without underlying ischemia or injury pattern.  Echocardiogram: 04/21/2021:  1. Left ventricular ejection fraction, by estimation, is 60 to 65%. The  left ventricle has normal function. The left ventricle has no regional  wall motion abnormalities. Left ventricular diastolic parameters were  normal.   2. Right ventricular systolic function is normal. The right ventricular  size is normal. Tricuspid regurgitation signal is inadequate for assessing  PA pressure.   3. The mitral valve is grossly normal. Trivial mitral valve  regurgitation. No evidence of mitral stenosis.   4. The aortic valve is tricuspid. Aortic valve regurgitation is not  visualized. No aortic stenosis is present.   5. The inferior vena cava is normal in size with greater than 50%  respiratory variability, suggesting right atrial pressure of 3 mmHg.   Stress Testing:  Pending  Heart Catheterization: None  CT PE Protocol  04/20/2021: 1. Negative examination for pulmonary embolism. 2. Elevation of the left hemidiaphragm with minimal associated scarring and ground-glass, most likely sequelae of prior infection or aspiration, which may be ongoing. 3. Emphysema and  diffuse bilateral bronchial wall thickening. Aortic Atherosclerosis (ICD10-I70.0) and Emphysema (ICD10-J43.9).  IMPRESSION & RECOMMENDATIONS: Ronald Castaneda is a 65 y.o. African-American male whose past medical history and cardiovascular risk factors include: Tobacco use, hypertension, HIV, hyperlipidemia, aortic atherosclerosis..  Impression:  Dyspnea on exertion. Aortic atherosclerosis. Hyperlipidemia. Hypertension. Smoking cigarettes. HIV  Plan:  Patient does not have any active chest pain to suggest angina pectoris/ACS.  He does not come in with effort related dyspnea, mild elevation but overall flat high sensitive troponin levels, EKG does not show underlying ischemia, BNP within normal limits.  Had a long discussion with the patient during rounds that he needs to treat his underlying hypertension and hyperlipidemia with medical management and follow-up post discharge.  Echocardiogram today notes preserved LVEF, no regional wall motion abnormalities.  Recommend outpatient stress test given his ASCVD risk or and cardiovascular risk factors.  However, patient is adamant that he would like to undergo ischemic evaluation prior to discharge.  Estimated risk of ASCVD greater than 20% based on current parameters.  We will proceed with exercise nuclear stress test to evaluate for functional status and reversible ischemia.  If the nuclear stress test is reported to be low risk I would recommend discharging home from a cardiovascular standpoint with blood pressure medications as well as statin.  Given his aortic atherosclerosis aspirin 81 mg p.o. daily.  With close follow-up with cardiology and PCP as outpatient.  Thank you for allowing [place] to participate in the care of Ronald Castaneda please reach out if any  questions or concerns arise.   Total encounter time 84 minutes. *Total Encounter Time as defined by the Centers for Medicare and Medicaid Services includes, in addition to the face-to-face  time of a patient visit (documented in the note above) non-face-to-face time: obtaining and reviewing outside history, ordering and reviewing medications, tests or procedures, care coordination (communications with other health care professionals or caregivers) and documentation in the medical record.  Patient's questions and concerns were addressed to his satisfaction. He voices understanding of the instructions provided during this encounter.   This note was created using a voice recognition software as a result there may be grammatical errors inadvertently enclosed that do not reflect the nature of this encounter. Every attempt is made to correct such errors.  Mechele Claude [facility]  Pager: [phone] Office: [phone] 04/21/2021, 10:26 AM   ADDENDUM: Carlton Adam stress test 04/21/2021: 1. No reversible ischemia or infarction.   2. Normal left ventricular wall motion.   3. Left ventricular ejection fraction 62%   4. Non invasive risk stratification*: Low  Patient stress test was reported to be low risk, LVEF is preserved on echocardiogram, currently does not have any chest pain.  Patient will be followed peripherally.  Outpatient follow-up appointment scheduled for 05/29/2021.  Blood pressure management per primary team.  Recommendations conveyed to primary team.  Rex Kras, DO, [facility]  Pager: [phone] Office: [phone]

## 2021-04-21 NOTE — Plan of Care (Signed)
                                      [place]                            [address] [place], [place] [address]      Ronald Castaneda was admitted to the Hospital on 04/20/2021 and Discharged  04/21/2021 and should be excused from work/school   for 2  days starting from date -  04/20/2021 , may return to work/school without any restrictions.  Call Lala Lund MD, [facility]  [phone] with questions.  Lala Lund M.D on 04/21/2021,at 5:22 PM  [facility]   Office  [phone]

## 2021-04-21 NOTE — Progress Notes (Signed)
Notified via nurse tech that patient's wife had given home medication of Tivicay and Symtuza to patient. Tivicay was administered to patient with 1000 morning medications, creating a second additional dose. Attending physician notified or medications given to patient. Blood pressure 146/98, HR is 88. Patient and patient's wife heavily educated on risks and potential adverse effects that could happen. Both the patient and his wife verbally agreed and understand the consequences of what occurred.

## 2021-04-21 NOTE — Progress Notes (Signed)
  Echocardiogram 2D Echocardiogram has been performed.  Fidel Levy 04/21/2021, 10:34 AM

## 2021-04-21 NOTE — Discharge Instructions (Signed)
Follow with Primary MD Hoyt Koch, MD in 7 days   Get CBC, CMP, 2 view Chest X ray -  checked next visit within 1 week by Primary MD   Activity: As tolerated with Full fall precautions use walker/cane & assistance as needed  Disposition Home    Diet: Heart Healthy    Special Instructions: If you have smoked or chewed Tobacco  in the last 2 yrs please stop smoking, stop any regular Alcohol  and or any Recreational drug use.  On your next visit with your primary care physician please Get Medicines reviewed and adjusted.  Please request your Prim.MD to go over all Hospital Tests and Procedure/Radiological results at the follow up, please get all Hospital records sent to your Prim MD by signing hospital release before you go home.  If you experience worsening of your admission symptoms, develop shortness of breath, life threatening emergency, suicidal or homicidal thoughts you must seek medical attention immediately by calling 911 or calling your MD immediately  if symptoms less severe.  You Must read complete instructions/literature along with all the possible adverse reactions/side effects for all the Medicines you take and that have been prescribed to you. Take any new Medicines after you have completely understood and accpet all the possible adverse reactions/side effects.

## 2021-04-23 ENCOUNTER — Ambulatory Visit (INDEPENDENT_AMBULATORY_CARE_PROVIDER_SITE_OTHER): Payer: BC Managed Care – PPO | Admitting: Internal Medicine

## 2021-04-23 ENCOUNTER — Encounter: Payer: Self-pay | Admitting: Internal Medicine

## 2021-04-23 ENCOUNTER — Other Ambulatory Visit: Payer: Self-pay

## 2021-04-23 VITALS — BP 128/90 | HR 74 | Resp 18 | Ht 69.0 in | Wt 149.2 lb

## 2021-04-23 DIAGNOSIS — B20 Human immunodeficiency virus [HIV] disease: Secondary | ICD-10-CM

## 2021-04-23 DIAGNOSIS — I10 Essential (primary) hypertension: Secondary | ICD-10-CM | POA: Diagnosis not present

## 2021-04-23 DIAGNOSIS — J441 Chronic obstructive pulmonary disease with (acute) exacerbation: Secondary | ICD-10-CM

## 2021-04-23 DIAGNOSIS — F17200 Nicotine dependence, unspecified, uncomplicated: Secondary | ICD-10-CM

## 2021-04-23 MED ORDER — CARVEDILOL 3.125 MG PO TABS: 3.1250 mg | ORAL_TABLET | Freq: Two times a day (BID) | ORAL | 6 refills | Status: DC

## 2021-04-23 MED ORDER — AMLODIPINE BESYLATE 10 MG PO TABS: 10.0000 mg | ORAL_TABLET | Freq: Every day | ORAL | 6 refills | Status: DC

## 2021-04-23 NOTE — Patient Instructions (Signed)
Stay away from smoking lifelong.  We have refilled the blood pressure today and have ordered a lung function test to do maybe January or February.

## 2021-04-23 NOTE — Progress Notes (Signed)
   Subjective:   Patient ID: Ronald Castaneda, male    DOB: [birth year], 65 y.o.   MRN: [id number]  HPI The patient is a 65 YO man coming in for hospital follow up (in for SOB and treated for possible COPD exacerbation with steroids and antibiotics). He was discharged with an albuterol inhaler which he is using rarely. He has stopped smoking since discharge and plans to not resume. He does have SOB on exertion still and fatigue and cannot return to work at this time (due to physical nature of his job).   PMH, [facility], social history reviewed and updated  Review of Systems  Constitutional:  Positive for activity change and fatigue.  HENT: Negative.    Eyes: Negative.   Respiratory:  Positive for shortness of breath. Negative for cough and chest tightness.   Cardiovascular:  Negative for chest pain, palpitations and leg swelling.  Gastrointestinal:  Negative for abdominal distention, abdominal pain, constipation, diarrhea, nausea and vomiting.  Musculoskeletal: Negative.   Skin: Negative.   Neurological: Negative.   Psychiatric/Behavioral: Negative.     Objective:  Physical Exam Constitutional:      Appearance: He is well-developed.  HENT:     Head: Normocephalic and atraumatic.  Cardiovascular:     Rate and Rhythm: Normal rate and regular rhythm.  Pulmonary:     Effort: Pulmonary effort is normal. No respiratory distress.     Breath sounds: Rhonchi present. No wheezing or rales.     Comments: Minimal rhonchi Abdominal:     General: Bowel sounds are normal. There is no distension.     Palpations: Abdomen is soft.     Tenderness: There is no abdominal tenderness. There is no rebound.  Musculoskeletal:     Cervical back: Normal range of motion.  Skin:    General: Skin is warm and dry.  Neurological:     Mental Status: He is alert and oriented to person, place, and time.     Coordination: Coordination normal.    Vitals:   04/23/21 1019  BP: 128/90  Pulse: 74  Resp: 18  SpO2: 95%   Weight: 149 lb 3.2 oz (67.7 kg)  Height: 5\' 9"  (1.753 m)    This visit occurred during the SARS-CoV-2 public health emergency.  Safety protocols were in place, including screening questions prior to the visit, additional usage of staff PPE, and extensive cleaning of exam room while observing appropriate contact time as indicated for disinfecting solutions.   Assessment & Plan:

## 2021-04-24 NOTE — Assessment & Plan Note (Signed)
He did have recent low HIV viral load but stable CD4 count. Denies missing meds and will continue seeing ID for treatment.

## 2021-04-24 NOTE — Assessment & Plan Note (Signed)
Started on amlodipine 10 mg daily and coreg 3.125 mg BID in the hospital. These are refilled and we will check back with BP in 2-3 months to see if these are needed. Call back sooner for any dizziness or lightheadedness or side effects. He is stopping smoking which hopefully will lower BP.

## 2021-04-24 NOTE — Assessment & Plan Note (Signed)
He has not had PFTs but CT scan did show some COPD in the lungs. He is a smoker and has stopped the last few days with intention to quit for life. We have ordered PFTs to be done in 1-2 months so that we can get appropriate reading. He is still some SOB and tired and advised that likely he is still getting over the exacerbation and this will go back to his baseline. Work note given for another week out of work for recovery.

## 2021-04-24 NOTE — Assessment & Plan Note (Signed)
He has stopped in the last few days and intends to quit lifelong. Encouraged and reminded about risk/harm. Getting PFTs to check for COPD once recovered.

## 2021-04-28 ENCOUNTER — Other Ambulatory Visit (HOSPITAL_COMMUNITY): Payer: Self-pay

## 2021-04-29 ENCOUNTER — Telehealth: Payer: Self-pay

## 2021-04-29 NOTE — Telephone Encounter (Signed)
Pt called to see if he needed to be seen before he returns to work. Per the After Visit Summary from 04/23/21 patient does not need a follow up until 2 to 3 months for now. Patient notified.

## 2021-05-01 NOTE — Telephone Encounter (Signed)
Patient checking status of request  *see below*

## 2021-05-01 NOTE — Telephone Encounter (Signed)
Pt made aware that the letter was ready for pick up. Pt stated that he would pick the letter up on Monday. Letter placed up front at check in.

## 2021-05-01 NOTE — Telephone Encounter (Signed)
Fine for note to return to work.

## 2021-05-01 NOTE — Telephone Encounter (Signed)
See below

## 2021-05-01 NOTE — Telephone Encounter (Signed)
Patient requesting a doctor's not to report back to work on 05-04-2021  Please call patient [phone]

## 2021-05-06 ENCOUNTER — Other Ambulatory Visit (HOSPITAL_COMMUNITY): Payer: Self-pay

## 2021-05-08 ENCOUNTER — Other Ambulatory Visit (HOSPITAL_COMMUNITY): Payer: Self-pay

## 2021-05-29 ENCOUNTER — Ambulatory Visit: Payer: BC Managed Care – PPO | Admitting: Cardiology

## 2021-06-02 ENCOUNTER — Other Ambulatory Visit (HOSPITAL_COMMUNITY): Payer: Self-pay

## 2021-06-03 ENCOUNTER — Other Ambulatory Visit (HOSPITAL_COMMUNITY): Payer: Self-pay

## 2021-06-03 ENCOUNTER — Telehealth: Payer: Self-pay

## 2021-06-03 NOTE — Telephone Encounter (Signed)
[facility] Patient Advocate Encounter   Was successful in obtaining a Vi[employer] copay card for Tivicay.  This copay card will make the patients copay 0.00.  I have spoken with the patient.    The billing information is as follows and has been shared with [place].  RxBin: [id number] PCN: [id number] Member ID: [id number] Group ID: [id number]  Ronald Castaneda, [place] [facility] Phone: [phone] Fax:  [phone]

## 2021-06-15 ENCOUNTER — Ambulatory Visit: Payer: No Typology Code available for payment source | Admitting: Cardiology

## 2021-06-15 ENCOUNTER — Encounter: Payer: Self-pay | Admitting: Cardiology

## 2021-06-15 ENCOUNTER — Other Ambulatory Visit: Payer: Self-pay

## 2021-06-15 VITALS — BP 123/76 | HR 77 | Temp 98.3°F | Resp 17 | Ht 69.0 in | Wt 164.8 lb

## 2021-06-15 DIAGNOSIS — I10 Essential (primary) hypertension: Secondary | ICD-10-CM

## 2021-06-15 DIAGNOSIS — B20 Human immunodeficiency virus [HIV] disease: Secondary | ICD-10-CM

## 2021-06-15 DIAGNOSIS — I700 Atherosclerosis of aorta: Secondary | ICD-10-CM

## 2021-06-15 DIAGNOSIS — E7800 Pure hypercholesterolemia, unspecified: Secondary | ICD-10-CM

## 2021-06-15 DIAGNOSIS — Z09 Encounter for follow-up examination after completed treatment for conditions other than malignant neoplasm: Secondary | ICD-10-CM

## 2021-06-15 DIAGNOSIS — Z87891 Personal history of nicotine dependence: Secondary | ICD-10-CM

## 2021-06-15 NOTE — Progress Notes (Signed)
Date:  06/15/2021   ID:  Ronald Castaneda, DOB Mar 06, 1956, MRN [id number]  PCP:  Myrlene Broker, MD  Cardiologist:  Tessa Lerner, DO, [facility] (established care 04/21/2021)  Chief Complaint  Patient presents with   Hospitalization Follow-up   Chest Pain   Shortness of Breath    HPI  Ronald Castaneda is a 66 y.o. African-American male who presents to the office with a chief complaint of " hospital follow-up." Patient's past medical history and cardiovascular risk factors include: Former smoker, hypertension, HIV, hyperlipidemia, aortic atherosclerosis.  Patient was initially seen in consultation in November 2022 during his hospitalization for evaluation of shortness of breath and chest pain.  Despite his precordial discomfort, ECG was negative for ischemia, high sensitive troponins essentially flat, and BNP within normal limits.  Recommended outpatient stress test.  However, he felt comfortable having it done prior to discharge.  Patient was noted to have a preserved LVEF per gated nuclear SPECT and the study was low risk for reversible ischemia.  Echocardiogram noted preserved EF as well.  He now presents for follow-up and establish care.  Since hospitalization patient doing well from a cardiovascular standpoint.  He was also stopped smoking.  He is congratulated on his efforts and now being smoke-free.  FUNCTIONAL STATUS: No structured exercise program or daily routine.   ALLERGIES: No Known Allergies  MEDICATION LIST PRIOR TO VISIT: Current Meds  Medication Sig   amLODipine (NORVASC) 5 MG tablet Take 10 mg by mouth daily.   aspirin 81 MG chewable tablet Chew 1 tablet (81 mg total) by mouth daily.   carvedilol (COREG) 3.125 MG tablet Take 1 tablet (3.125 mg total) by mouth 2 (two) times daily with a meal.   Darunavir-Cobicistat-Emtricitabine-Tenofovir Alafenamide (SYMTUZA) 800-150-200-10 MG TABS TAKE 1 TABLET BY MOUTH DAILY WITH BREAKFAST.   dolutegravir (TIVICAY) 50 MG tablet  TAKE 1 TABLET (50 MG TOTAL) BY MOUTH DAILY. (Patient taking differently: Take 50 mg by mouth daily.)   oxymetazoline (AFRIN) 0.05 % nasal spray Place 1 spray into both nostrils 2 (two) times daily as needed for congestion.     PAST MEDICAL HISTORY: Past Medical History:  Diagnosis Date   Depression    HIV infection (HCC)    Tobacco use     PAST SURGICAL HISTORY: Past Surgical History:  Procedure Laterality Date   None      FAMILY HISTORY: The patient family history includes Diabetes type I in his daughter; Healthy in his brother and sister; Hypertension in his mother.  SOCIAL HISTORY:  The patient  reports that he has been smoking cigarettes. He has a 23.00 pack-year smoking history. He has never used smokeless tobacco. He reports current alcohol use of about 6.0 standard drinks per week. He reports that he does not use drugs.  REVIEW OF SYSTEMS: Review of Systems  Constitutional: Negative for chills and fever.  HENT:  Negative for hoarse voice and nosebleeds.   Eyes:  Negative for discharge, double vision and pain.  Cardiovascular:  Negative for chest pain, claudication, dyspnea on exertion, leg swelling, near-syncope, orthopnea, palpitations, paroxysmal nocturnal dyspnea and syncope.  Respiratory:  Negative for hemoptysis and shortness of breath.   Musculoskeletal:  Negative for muscle cramps and myalgias.  Gastrointestinal:  Negative for abdominal pain, constipation, diarrhea, hematemesis, hematochezia, melena, nausea and vomiting.  Neurological:  Negative for dizziness and light-headedness.   PHYSICAL EXAM: Vitals with BMI 06/15/2021 04/23/2021 04/21/2021  Height 5\' 9"  5\' 9"  -  Weight 164 lbs 13 oz 149 lbs  3 oz -  BMI 12.87 86.76 -  Systolic 720 947 096  Diastolic 76 90 89  Pulse 77 74 87    CONSTITUTIONAL: Well-developed and well-nourished. No acute distress.  SKIN: Skin is warm and dry. No rash noted. No cyanosis. No pallor. No jaundice HEAD: Normocephalic and  atraumatic.  EYES: No scleral icterus MOUTH/THROAT: Moist oral membranes.  NECK: No JVD present. No thyromegaly noted. No carotid bruits  LYMPHATIC: No visible cervical adenopathy.  CHEST Normal respiratory effort. No intercostal retractions  LUNGS: Clear to auscultation bilaterally.  No stridor. No wheezes. No rales.  CARDIOVASCULAR: Regular rate and rhythm, positive S1-S2, no murmurs rubs or gallops appreciated. ABDOMINAL: Soft, nontender, nondistended, positive bowel sounds in all 4 quadrants, no apparent ascites.  EXTREMITIES: No peripheral edema, warm to touch, 2+ bilateral DP and PT pulses HEMATOLOGIC: No significant bruising NEUROLOGIC: Oriented to person, place, and time. Nonfocal. Normal muscle tone.  PSYCHIATRIC: Normal mood and affect. Normal behavior. Cooperative  CARDIAC DATABASE: EKG: 06/15/2021: NSR, 63 bpm, negative precordial T waves, without underlying injury pattern.   Echocardiogram: 04/21/2021: LVEF 28-36%, normal diastolic function, trivial MR.    Stress Testing: Exercise nuclear stress test 04/21/2021 No evidence of ischemia or infarct. Normal wall motion. LVEF calculated 62%. Low risk study  Heart Catheterization: None  LABORATORY DATA: CBC Latest Ref Rng & Units 04/20/2021 04/17/2021 09/24/2020  WBC 4.0 - 10.5 K/uL 9.3 8.9 6.1  Hemoglobin 13.0 - 17.0 g/dL 16.3 15.2 15.5  Hematocrit 39.0 - 52.0 % 46.6 44.1 47.3  Platelets 150 - 400 K/uL 237 231 255    CMP Latest Ref Rng & Units 04/20/2021 04/17/2021 09/24/2020  Glucose 70 - 99 mg/dL 122(H) 99 96  BUN 8 - 23 mg/dL 27(H) 16 20  Creatinine 0.61 - 1.24 mg/dL 0.97 0.77 1.10  Sodium 135 - 145 mmol/L 139 138 139  Potassium 3.5 - 5.1 mmol/L 3.6 4.1 4.3  Chloride 98 - 111 mmol/L 104 102 105  CO2 22 - 32 mmol/L $RemoveB'26 29 26  'veZQZuYf$ Calcium 8.9 - 10.3 mg/dL 8.8(L) 9.1 9.4  Total Protein 6.5 - 8.1 g/dL 7.7 - 7.2  Total Bilirubin 0.3 - 1.2 mg/dL 0.5 - 0.2  Alkaline Phos 38 - 126 U/L 82 - -  AST 15 - 41 U/L 33 - 20  ALT  0 - 44 U/L 23 - 15    Lipid Panel  Lab Results  Component Value Date   CHOL 225 (H) 09/24/2020   HDL 45 09/24/2020   LDLCALC 142 (H) 09/24/2020   TRIG 238 (H) 09/24/2020   CHOLHDL 5.0 (H) 09/24/2020     No components found for: NTPROBNP No results for input(s): PROBNP in the last 8760 hours. No results for input(s): TSH in the last 8760 hours.  BMP Recent Labs    09/24/20 0910 04/17/21 1332 04/20/21 1107  NA 139 138 139  K 4.3 4.1 3.6  CL 105 102 104  CO2 $Re'26 29 26  'Jbu$ GLUCOSE 96 99 122*  BUN 20 16 27*  CREATININE 1.10 0.77 0.97  CALCIUM 9.4 9.1 8.8*  GFRNONAA  --  >60 >60    HEMOGLOBIN A1C Lab Results  Component Value Date   HGBA1C 6.2 02/20/2015    IMPRESSION:    ICD-10-CM   1. Hospital discharge follow-up  Z09     2. Benign hypertension  I10 EKG 12-Lead    3. Atherosclerosis of aorta (HCC)  I70.0 CT CARDIAC SCORING ([facility] LOCATIONS ONLY)    4. HIV infection, unspecified  symptom status ([place])  B20     5. Former smoker  Z87.891     60. Pure hypercholesterolemia  E78.00 CMP14+EGFR    Lipid Panel With LDL/HDL Ratio    LDL cholesterol, direct    CK Total (and CKMB)    CT CARDIAC SCORING ([facility] LOCATIONS ONLY)       RECOMMENDATIONS: Carlen Fils is a 67 y.o. African-American male whose past medical history and cardiac risk factors include: Former smoker, hypertension, HIV, hyperlipidemia, aortic atherosclerosis.  Patient recently hospitalized and evaluated for chest pain and shortness of breath.  He underwent an echocardiogram and stress test as discussed above and reviewed as part of today's office visit.  Since hospitalization he has not experiencing any chest pain or shortness of breath.  Patient is focused on improving his modifiable cardiovascular risk factors.  He has successfully stopped smoking as of November 2022 for which he is congratulated for her during today's office visit.  Patient is noted to have hyperlipidemia for which she was started on  Crestor during his recent hospitalization but is currently not taking it.  Would like to recheck fasting lipid profile, CMP, direct LDL.  We will also check coronary calcium score for risk stratification.  Patient is educated on improving his physical activity with a goal of 30 minutes a day 5 days a week.  FINAL MEDICATION LIST END OF ENCOUNTER: No orders of the defined types were placed in this encounter.   Medications Discontinued During This Encounter  Medication Reason   doxycycline (VIBRA-TABS) 100 MG tablet    amLODipine (NORVASC) 10 MG tablet      Current Outpatient Medications:    amLODipine (NORVASC) 5 MG tablet, Take 10 mg by mouth daily., Disp: , Rfl:    aspirin 81 MG chewable tablet, Chew 1 tablet (81 mg total) by mouth daily., Disp: 30 tablet, Rfl: 0   carvedilol (COREG) 3.125 MG tablet, Take 1 tablet (3.125 mg total) by mouth 2 (two) times daily with a meal., Disp: 60 tablet, Rfl: 6   Darunavir-Cobicistat-Emtricitabine-Tenofovir Alafenamide (SYMTUZA) 800-150-200-10 MG TABS, TAKE 1 TABLET BY MOUTH DAILY WITH BREAKFAST., Disp: 30 tablet, Rfl: 11   dolutegravir (TIVICAY) 50 MG tablet, TAKE 1 TABLET (50 MG TOTAL) BY MOUTH DAILY. (Patient taking differently: Take 50 mg by mouth daily.), Disp: 30 tablet, Rfl: 11   oxymetazoline (AFRIN) 0.05 % nasal spray, Place 1 spray into both nostrils 2 (two) times daily as needed for congestion., Disp: , Rfl:    rosuvastatin (CRESTOR) 10 MG tablet, Take 1 tablet (10 mg total) by mouth daily. (Patient not taking: Reported on 06/15/2021), Disp: 30 tablet, Rfl: 0  Orders Placed This Encounter  Procedures   CT CARDIAC SCORING ([facility] LOCATIONS ONLY)   CMP14+EGFR   Lipid Panel With LDL/HDL Ratio   LDL cholesterol, direct   CK Total (and CKMB)   EKG 12-Lead    There are no Patient Instructions on file for this visit.   --Continue cardiac medications as reconciled in final medication list. --Return in about 8 weeks (around 08/10/2021) for Follow  up, Review lab results. Or sooner if needed. --Continue follow-up with your primary care physician regarding the management of your other chronic comorbid conditions.  Patient's questions and concerns were addressed to his satisfaction. He voices understanding of the instructions provided during this encounter.   This note was created using a voice recognition software as a result there may be grammatical errors inadvertently enclosed that do not reflect the nature of this encounter.  Every attempt is made to correct such errors.  Rex Kras, [place], [facility]  Pager: [phone] Office: [phone]

## 2021-06-29 ENCOUNTER — Other Ambulatory Visit (HOSPITAL_COMMUNITY): Payer: Self-pay

## 2021-06-30 ENCOUNTER — Other Ambulatory Visit (HOSPITAL_COMMUNITY): Payer: Self-pay

## 2021-07-08 ENCOUNTER — Other Ambulatory Visit: Payer: BC Managed Care – PPO

## 2021-07-27 ENCOUNTER — Other Ambulatory Visit (HOSPITAL_COMMUNITY): Payer: Self-pay

## 2021-07-28 ENCOUNTER — Other Ambulatory Visit (HOSPITAL_COMMUNITY): Payer: Self-pay

## 2021-08-04 ENCOUNTER — Other Ambulatory Visit: Payer: BC Managed Care – PPO

## 2021-08-10 ENCOUNTER — Ambulatory Visit: Payer: BC Managed Care – PPO | Admitting: Cardiology

## 2021-08-11 ENCOUNTER — Other Ambulatory Visit: Payer: Self-pay

## 2021-08-11 ENCOUNTER — Ambulatory Visit: Payer: 59 | Admitting: Internal Medicine

## 2021-08-11 ENCOUNTER — Encounter: Payer: Self-pay | Admitting: Internal Medicine

## 2021-08-11 DIAGNOSIS — M5010 Cervical disc disorder with radiculopathy, unspecified cervical region: Secondary | ICD-10-CM

## 2021-08-11 NOTE — Progress Notes (Signed)
? ?  Subjective:  ? ?Patient ID: Ronald Castaneda, male    DOB: 27-Oct-1955, 66 y.o.   MRN: [id number] ? ?HPI ?The patient is a 66 y.o. man coming in for ongoing neck pain.  ? ?Review of Systems  ?Constitutional: Negative.   ?HENT: Negative.    ?Eyes: Negative.   ?Respiratory:  Negative for cough, chest tightness and shortness of breath.   ?Cardiovascular:  Negative for chest pain, palpitations and leg swelling.  ?Gastrointestinal:  Negative for abdominal distention, abdominal pain, constipation, diarrhea, nausea and vomiting.  ?Musculoskeletal:  Positive for myalgias and neck pain.  ?Skin: Negative.   ?Neurological:  Positive for numbness.  ?Psychiatric/Behavioral: Negative.    ? ?Objective:  ?Physical Exam ?Constitutional:   ?   Appearance: He is well-developed.  ?HENT:  ?   Head: Normocephalic and atraumatic.  ?Cardiovascular:  ?   Rate and Rhythm: Normal rate and regular rhythm.  ?Pulmonary:  ?   Effort: Pulmonary effort is normal. No respiratory distress.  ?   Breath sounds: Normal breath sounds. No wheezing or rales.  ?Abdominal:  ?   General: Bowel sounds are normal. There is no distension.  ?   Palpations: Abdomen is soft.  ?   Tenderness: There is no abdominal tenderness. There is no rebound.  ?Musculoskeletal:     ?   General: Tenderness present.  ?   Cervical back: Normal range of motion.  ?Skin: ?   General: Skin is warm and dry.  ?Neurological:  ?   Mental Status: He is alert and oriented to person, place, and time.  ?   Coordination: Coordination normal.  ? ? ?Vitals:  ? 08/11/21 0824  ?BP: 126/74  ?Pulse: 66  ?Resp: 18  ?SpO2: 99%  ?Weight: 166 lb 6.4 oz (75.5 kg)  ?Height: '5\' 9"'$  (1.753 m)  ? ? ?This visit occurred during the SARS-CoV-2 public health emergency.  Safety protocols were in place, including screening questions prior to the visit, additional usage of staff PPE, and extensive cleaning of exam room while observing appropriate contact time as indicated for disinfecting solutions.  ? ?Assessment &  Plan:  ?Visit time 15 minutes in face to face communication with patient and coordination of care, additional 10 minutes spent in record review, coordination or care, ordering tests, communicating/referring to other healthcare professionals, documenting in medical records all on the same day of the visit for total time 25 minutes spent on the visit.  ? ?

## 2021-08-11 NOTE — Patient Instructions (Signed)
We have done the letter for your job. ? ? ?

## 2021-08-11 NOTE — Assessment & Plan Note (Addendum)
Currently having stable amounts of pain and using otc NSAIDs for pain relief. He is unable to lift excessive amounts of weight. 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 He is unable to perform other taks at his job due to the strain on his neck. Work note done today.  ?

## 2021-08-18 ENCOUNTER — Ambulatory Visit (INDEPENDENT_AMBULATORY_CARE_PROVIDER_SITE_OTHER): Payer: 59 | Admitting: Internal Medicine

## 2021-08-18 ENCOUNTER — Encounter: Payer: Self-pay | Admitting: Internal Medicine

## 2021-08-18 DIAGNOSIS — M5010 Cervical disc disorder with radiculopathy, unspecified cervical region: Secondary | ICD-10-CM | POA: Diagnosis not present

## 2021-08-18 NOTE — Progress Notes (Signed)
? ?  Subjective:  ? ?Patient ID: Ronald Castaneda, male    DOB: [birth year], 66 y.o.   MRN: [id number] ? ?HPI ?The patient is a 66 YO man coming in for ongoing concerns about his job and that they are trying to make him do things that are not good for his back ? ?Review of Systems  ?Constitutional:  Positive for activity change. Negative for appetite change, fatigue, fever and unexpected weight change.  ?Respiratory: Negative.    ?Cardiovascular: Negative.   ?Musculoskeletal:  Positive for myalgias and neck pain. Negative for arthralgias and back pain.  ?Skin: Negative.   ?Neurological:  Negative for syncope, weakness and numbness.  ? ?Objective:  ?Physical Exam ?Constitutional:   ?   Appearance: He is well-developed.  ?HENT:  ?   Head: Normocephalic and atraumatic.  ?Cardiovascular:  ?   Rate and Rhythm: Normal rate and regular rhythm.  ?Pulmonary:  ?   Effort: Pulmonary effort is normal. No respiratory distress.  ?   Breath sounds: Normal breath sounds. No wheezing or rales.  ?Abdominal:  ?   General: Bowel sounds are normal. There is no distension.  ?   Palpations: Abdomen is soft.  ?   Tenderness: There is no abdominal tenderness. There is no rebound.  ?Musculoskeletal:     ?   General: Tenderness present.  ?   Cervical back: Normal range of motion.  ?Skin: ?   General: Skin is warm and dry.  ?Neurological:  ?   Mental Status: He is alert and oriented to person, place, and time.  ?   Coordination: Coordination normal.  ? ? ?Vitals:  ? 08/18/21 1349  ?BP: 126/60  ?Pulse: 87  ?Resp: 18  ?SpO2: 99%  ?Weight: 167 lb 3.2 oz (75.8 kg)  ?Height: '5\' 9"'$  (1.753 m)  ? ? ?This visit occurred during the SARS-CoV-2 public health emergency.  Safety protocols were in place, including screening questions prior to the visit, additional usage of staff PPE, and extensive cleaning of exam room while observing appropriate contact time as indicated for disinfecting solutions.  ? ?Assessment & Plan:  ?Visit time 15 minutes in face to face  communication with patient and coordination of care, additional 10 minutes spent in record review, coordination or care, ordering tests, communicating/referring to other healthcare professionals, documenting in medical records all on the same day of the visit for total time 25 minutes spent on the visit.  ? ?

## 2021-08-20 ENCOUNTER — Other Ambulatory Visit (HOSPITAL_COMMUNITY): Payer: Self-pay

## 2021-08-21 ENCOUNTER — Encounter: Payer: Self-pay | Admitting: Internal Medicine

## 2021-08-21 NOTE — Assessment & Plan Note (Signed)
Form filled out for accommodation form. Prior work letter done was not acceptable. He is using BC powder for pain currently and is concerned about doing damage to his neck if he is performing tasks that hurt his neck. ?

## 2021-08-25 ENCOUNTER — Telehealth: Payer: Self-pay

## 2021-08-25 NOTE — Telephone Encounter (Signed)
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 He stated that he needs a note saying his unable to perform this jon function.  ?

## 2021-08-25 NOTE — Telephone Encounter (Signed)
According to the paperwork this is part of his job function and the only way he will get a disability exception is if he is able to do all job functions with accommodation. I filled out the accommodation forms with his limitations so he will need to contact his work to see if they are able to grant this.  ?

## 2021-08-26 ENCOUNTER — Encounter: Payer: Self-pay | Admitting: Internal Medicine

## 2021-08-26 ENCOUNTER — Ambulatory Visit (INDEPENDENT_AMBULATORY_CARE_PROVIDER_SITE_OTHER): Payer: 59 | Admitting: Internal Medicine

## 2021-08-26 DIAGNOSIS — M5010 Cervical disc disorder with radiculopathy, unspecified cervical region: Secondary | ICD-10-CM | POA: Diagnosis not present

## 2021-08-26 NOTE — Progress Notes (Signed)
? ?  Subjective:  ? ?Patient ID: Ronald Castaneda, male    DOB: August 18, 1955, 66 y.o.   MRN: [id number] ? ?HPI ?The patient is a 66 YO man coming in for ongoing issues with medical condition and work accommodations. ? ?Review of Systems  ?Constitutional:  Positive for activity change. Negative for appetite change, fatigue, fever and unexpected weight change.  ?Respiratory: Negative.    ?Cardiovascular: Negative.   ?Musculoskeletal:  Positive for myalgias and neck pain. Negative for arthralgias and back pain.  ?Skin: Negative.   ?Neurological:  Negative for syncope, weakness and numbness.  ? ?Objective:  ?Physical Exam ?Constitutional:   ?   Appearance: Normal appearance.  ?HENT:  ?   Head: Normocephalic.  ?Cardiovascular:  ?   Rate and Rhythm: Normal rate and regular rhythm.  ?Pulmonary:  ?   Effort: Pulmonary effort is normal.  ?Musculoskeletal:     ?   General: Tenderness present. Normal range of motion.  ?Skin: ?   General: Skin is warm and dry.  ?Neurological:  ?   General: No focal deficit present.  ?   Mental Status: He is alert and oriented to person, place, and time.  ? ? ?Vitals:  ? 08/26/21 0801  ?BP: 130/70  ?Pulse: 71  ?Resp: 18  ?SpO2: 96%  ?Weight: 168 lb (76.2 kg)  ?Height: '5\' 9"'$  (1.753 m)  ? ? ?This visit occurred during the SARS-CoV-2 public health emergency.  Safety protocols were in place, including screening questions prior to the visit, additional usage of staff PPE, and extensive cleaning of exam room while observing appropriate contact time as indicated for disinfecting solutions.  ? ?Assessment & Plan:  ?Visit time 15 minutes in face to face communication with patient and coordination of care, additional 5 minutes spent in record review, coordination or care, ordering tests, communicating/referring to other healthcare professionals, documenting in medical records all on the same day of the visit for total time 20 minutes spent on the visit.  ? ?

## 2021-08-26 NOTE — Telephone Encounter (Signed)
Note given to pt during his visit today  ?

## 2021-08-26 NOTE — Assessment & Plan Note (Signed)
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 Counseled that since his job considers this an essential job function he may need to check on his exception accommodation form. ?

## 2021-09-04 ENCOUNTER — Telehealth: Payer: Self-pay

## 2021-09-04 NOTE — Telephone Encounter (Signed)
Called pt. Ronald Castaneda asking him to give our office a call back to discuss his work limitations. Office number was provided.  ?

## 2021-09-10 ENCOUNTER — Ambulatory Visit: Payer: 59 | Admitting: Internal Medicine

## 2021-09-11 ENCOUNTER — Other Ambulatory Visit (HOSPITAL_COMMUNITY): Payer: Self-pay

## 2021-09-16 ENCOUNTER — Other Ambulatory Visit (HOSPITAL_COMMUNITY): Payer: Self-pay

## 2021-09-30 ENCOUNTER — Other Ambulatory Visit: Payer: 59

## 2021-10-07 ENCOUNTER — Other Ambulatory Visit (HOSPITAL_COMMUNITY): Payer: Self-pay

## 2021-10-08 ENCOUNTER — Ambulatory Visit: Payer: 59 | Admitting: Internal Medicine

## 2021-10-08 NOTE — Telephone Encounter (Signed)
Pt called in and states his HR department told him that this office told them that he declined an of,fice visit.   Pt states that he never declined an office visit and that "that was a lie."   Wants to have a call back from assistant to discuss this.

## 2021-10-09 ENCOUNTER — Other Ambulatory Visit (HOSPITAL_COMMUNITY): Payer: Self-pay

## 2021-10-12 ENCOUNTER — Other Ambulatory Visit (HOSPITAL_COMMUNITY): Payer: Self-pay

## 2021-10-13 ENCOUNTER — Other Ambulatory Visit (HOSPITAL_COMMUNITY): Payer: Self-pay

## 2021-10-14 ENCOUNTER — Ambulatory Visit (INDEPENDENT_AMBULATORY_CARE_PROVIDER_SITE_OTHER): Payer: 59 | Admitting: Internal Medicine

## 2021-10-14 ENCOUNTER — Other Ambulatory Visit: Payer: Self-pay

## 2021-10-14 DIAGNOSIS — B20 Human immunodeficiency virus [HIV] disease: Secondary | ICD-10-CM

## 2021-10-14 MED ORDER — SYMTUZA 800-150-200-10 MG PO TABS: 1.0000 | ORAL_TABLET | Freq: Every day | ORAL | 11 refills | Status: DC

## 2021-10-14 MED ORDER — TIVICAY 50 MG PO TABS: ORAL_TABLET | Freq: Every day | ORAL | 11 refills | Status: DC

## 2021-10-14 NOTE — Assessment & Plan Note (Signed)
His infection has been under very good, long-term control since switching to his current salvage regimen.  We will update his lab work today.  He will continue Symtuza and Tivicay and follow-up in 1 year.

## 2021-10-14 NOTE — Progress Notes (Signed)
Patient Active Problem List   Diagnosis Date Noted   HIV disease ([place]) 02/27/2015    Priority: High   Dyspnea on exertion    Atherosclerosis of aorta (HCC)    Hypercholesteremia    Benign hypertension    Smoking    COPD exacerbation ([place]) 04/20/2021   Left hip pain 10/23/2020   Cervical disc disorder with radiculopathy of cervical region 06/17/2016   Parotid nodule 06/17/2016   Erectile dysfunction 09/09/2015   Depression 04/01/2015   Routine general medical examination at a health care facility 05/03/2014   Lipoma of back 05/03/2014   Former smoker 05/03/2014    Patient's Medications  New Prescriptions   No medications on file  Previous Medications   AMLODIPINE (NORVASC) 5 MG TABLET    Take 10 mg by mouth daily.   ASPIRIN 81 MG CHEWABLE TABLET    Chew 1 tablet (81 mg total) by mouth daily.   CARVEDILOL (COREG) 3.125 MG TABLET    Take 1 tablet (3.125 mg total) by mouth 2 (two) times daily with a meal.   OXYMETAZOLINE (AFRIN) 0.05 % NASAL SPRAY    Place 1 spray into both nostrils 2 (two) times daily as needed for congestion.   ROSUVASTATIN (CRESTOR) 10 MG TABLET    Take 1 tablet (10 mg total) by mouth daily.  Modified Medications   Modified Medication Previous Medication   DARUNAVIR-COBICISTAT-EMTRICITABINE-TENOFOVIR ALAFENAMIDE (SYMTUZA) 800-150-200-10 MG TABS Darunavir-Cobicistat-Emtricitabine-Tenofovir Alafenamide (SYMTUZA) 800-150-200-10 MG TABS      TAKE 1 TABLET BY MOUTH DAILY WITH BREAKFAST.    TAKE 1 TABLET BY MOUTH DAILY WITH BREAKFAST.   DOLUTEGRAVIR (TIVICAY) 50 MG TABLET dolutegravir (TIVICAY) 50 MG tablet      TAKE 1 TABLET (50 MG TOTAL) BY MOUTH DAILY.    TAKE 1 TABLET (50 MG TOTAL) BY MOUTH DAILY.  Discontinued Medications   No medications on file    Subjective: That he is in for his routine HIV follow-up visit.  He denies any problems obtaining, taking or tolerating his Symtuza or Tivicay and does not recall missing any doses.  He is not on any  new medications.  He is feeling well.  He quit smoking cigarettes last fall and is feeling much better and saving money.  He says that he is currently planning to retire in about 1 year.  Review of Systems: Review of Systems  Constitutional:  Negative for fever and weight loss.   Past Medical History:  Diagnosis Date   Depression    HIV infection ([place])    Tobacco use     Social History   Tobacco Use   Smoking status: Former    Packs/day: 0.50    Years: 46.00    Pack years: 23.00    Types: Cigarettes   Smokeless tobacco: Never   Tobacco comments:    stressed at work, unable to decrease at this time; thinking about cutting down "it's getting old"  Vaping Use   Vaping Use: Never used  Substance Use Topics   Alcohol use: Yes    Alcohol/week: 6.0 standard drinks    Types: 6 Cans of beer per week    Comment: weekend drinker (3 -4 beers)   Drug use: No    Family History  Problem Relation Age of Onset   Hypertension Mother    Healthy Sister    Healthy Brother    Diabetes type I Daughter     No Known Allergies  Health Maintenance  Topic Date Due   COVID-19 Vaccine (1) Never done   TETANUS/TDAP  Never done   Zoster Vaccines- Shingrix (1 of 2) Never done   COLONOSCOPY (Pts 45-49yr Insurance coverage will need to be confirmed)  Never done   Pneumonia Vaccine 66 Years old (2 - PCV) 03/16/2017   INFLUENZA VACCINE  12/22/2021   Hepatitis C Screening  Completed   HIV Screening  Completed   HPV VACCINES  Aged Out    Objective:  Vitals:   10/14/21 0834  BP: 115/78  Pulse: 75  Temp: 97.9 F (36.6 C)  TempSrc: Oral  SpO2: 96%  Weight: 169 lb (76.7 kg)   Body mass index is 24.96 kg/m.  Physical Exam Constitutional:      Comments: He is in good spirits.  Cardiovascular:     Rate and Rhythm: Normal rate and regular rhythm.     Heart sounds: No murmur heard.    Comments: Distant heart sounds. Pulmonary:     Effort: Pulmonary effort is normal.     Breath  sounds: Normal breath sounds.  Psychiatric:        Mood and Affect: Mood normal.    Lab Results Lab Results  Component Value Date   WBC 9.3 04/20/2021   HGB 16.3 04/20/2021   HCT 46.6 04/20/2021   MCV 79.4 (L) 04/20/2021   PLT 237 04/20/2021    Lab Results  Component Value Date   CREATININE 0.97 04/20/2021   BUN 27 (H) 04/20/2021   NA 139 04/20/2021   K 3.6 04/20/2021   CL 104 04/20/2021   CO2 26 04/20/2021    Lab Results  Component Value Date   ALT 23 04/20/2021   AST 33 04/20/2021   ALKPHOS 82 04/20/2021   BILITOT 0.5 04/20/2021    Lab Results  Component Value Date   CHOL 225 (H) 09/24/2020   HDL 45 09/24/2020   LDLCALC 142 (H) 09/24/2020   TRIG 238 (H) 09/24/2020   CHOLHDL 5.0 (H) 09/24/2020   Lab Results  Component Value Date   LABRPR NON-REACTIVE 09/24/2020   HIV 1 RNA Quant (Copies/mL)  Date Value  04/09/2021 30 (H)  09/24/2020 <20 (H)  03/26/2020 <20 (H)   CD4 T Cell Abs (/uL)  Date Value  04/09/2021 478  03/26/2020 446  06/07/2019 473     Problem List Items Addressed This Visit       High   [place] disease [place]    His infection has been under very good, long-term control since switching to his current salvage regimen.  We will update his lab work today.  He will continue Symtuza and Tivicay and follow-up in 1 year.       Relevant Medications   Darunavir-Cobicistat-Emtricitabine-Tenofovir Alafenamide (SYMTUZA) 800-150-200-10 MG TABS   dolutegravir (TIVICAY) 50 MG tablet   Other Relevant Orders   [facility]-helper cells (CD4) count ([facility]   HIV-1 RNA quant-no reflex-bld   CBC   Comprehensive metabolic panel   RPR   Lipid panel   CBC   [facility]CD[facility]) [facility]   Comprehensive metabolic panel   Lipid panel   RPR   HIV-1 RNA quant-no reflex-bld      JMichel Bickers MD RSt. [facility] Ft. Thomasfor Infectious DGreencastleGroup [phone]   [phone] 10/14/2021, 8:46 AM

## 2021-10-16 LAB — COMPREHENSIVE METABOLIC PANEL
AG Ratio: 1.2 (calc) (ref 1.0–2.5)
ALT: 15 U/L (ref 9–46)
AST: 18 U/L (ref 10–35)
Albumin: 4 g/dL (ref 3.6–5.1)
Alkaline phosphatase (APISO): 93 U/L (ref 35–144)
BUN: 20 mg/dL (ref 7–25)
CO2: 24 mmol/L (ref 20–32)
Calcium: 9.2 mg/dL (ref 8.6–10.3)
Chloride: 106 mmol/L (ref 98–110)
Creat: 1.23 mg/dL (ref 0.70–1.35)
Globulin: 3.4 g/dL (calc) (ref 1.9–3.7)
Glucose, Bld: 95 mg/dL (ref 65–99)
Potassium: 4.1 mmol/L (ref 3.5–5.3)
Sodium: 140 mmol/L (ref 135–146)
Total Bilirubin: 0.2 mg/dL (ref 0.2–1.2)
Total Protein: 7.4 g/dL (ref 6.1–8.1)

## 2021-10-16 LAB — HIV-1 RNA QUANT-NO REFLEX-BLD
HIV 1 RNA Quant: NOT DETECTED copies/mL
HIV-1 RNA Quant, Log: NOT DETECTED Log copies/mL

## 2021-10-16 LAB — CBC
HCT: 43.3 % (ref 38.5–50.0)
Hemoglobin: 14.3 g/dL (ref 13.2–17.1)
MCH: 27.1 pg (ref 27.0–33.0)
MCHC: 33 g/dL (ref 32.0–36.0)
MCV: 82.2 fL (ref 80.0–100.0)
MPV: 10.6 fL (ref 7.5–12.5)
Platelets: 265 10*3/uL (ref 140–400)
RBC: 5.27 10*6/uL (ref 4.20–5.80)
RDW: 17 % — ABNORMAL HIGH (ref 11.0–15.0)
WBC: 6.4 10*3/uL (ref 3.8–10.8)

## 2021-10-16 LAB — T-HELPER CELLS (CD4) COUNT (NOT AT ARMC)
CD4 % Helper T Cell: 22 % — ABNORMAL LOW (ref 33–65)
CD4 T Cell Abs: 495 /uL (ref 400–1790)

## 2021-10-16 LAB — RPR: RPR Ser Ql: NONREACTIVE

## 2021-10-16 LAB — LIPID PANEL
Cholesterol: 219 mg/dL — ABNORMAL HIGH (ref <200)
HDL: 46 mg/dL (ref >=40)
LDL Cholesterol (Calc): 140 mg/dL (calc) — ABNORMAL HIGH
Non-HDL Cholesterol (Calc): 173 mg/dL (calc) — ABNORMAL HIGH (ref <130)
Total CHOL/HDL Ratio: 4.8 (calc) (ref <5.0)
Triglycerides: 193 mg/dL — ABNORMAL HIGH (ref <150)

## 2021-10-29 ENCOUNTER — Other Ambulatory Visit: Payer: 59

## 2021-11-02 ENCOUNTER — Other Ambulatory Visit (HOSPITAL_COMMUNITY): Payer: Self-pay

## 2021-11-03 ENCOUNTER — Other Ambulatory Visit: Payer: 59

## 2021-11-12 ENCOUNTER — Other Ambulatory Visit: Payer: Self-pay | Admitting: Internal Medicine

## 2021-12-23 ENCOUNTER — Ambulatory Visit
Admission: RE | Admit: 2021-12-23 | Discharge: 2021-12-23 | Disposition: A | Discharge status: Home / Self Care | Payer: No Typology Code available for payment source | Source: Ambulatory Visit | Attending: Cardiology | Admitting: Cardiology

## 2021-12-23 DIAGNOSIS — I700 Atherosclerosis of aorta: Secondary | ICD-10-CM

## 2021-12-23 DIAGNOSIS — E7800 Pure hypercholesterolemia, unspecified: Secondary | ICD-10-CM

## 2021-12-29 NOTE — Progress Notes (Signed)
Called and spoke to patient he is aware of results patient said he will call his pcp regarding his cholesterol

## 2022-01-07 ENCOUNTER — Encounter: Payer: Self-pay | Admitting: Internal Medicine

## 2022-01-07 ENCOUNTER — Ambulatory Visit (INDEPENDENT_AMBULATORY_CARE_PROVIDER_SITE_OTHER): Payer: 59 | Admitting: Internal Medicine

## 2022-01-07 DIAGNOSIS — E7800 Pure hypercholesterolemia, unspecified: Secondary | ICD-10-CM | POA: Diagnosis not present

## 2022-01-07 DIAGNOSIS — I700 Atherosclerosis of aorta: Secondary | ICD-10-CM | POA: Diagnosis not present

## 2022-01-07 MED ORDER — ROSUVASTATIN CALCIUM 10 MG PO TABS: 10.0000 mg | ORAL_TABLET | Freq: Every day | ORAL | 3 refills | Status: DC

## 2022-01-07 NOTE — Assessment & Plan Note (Signed)
Did stop taking crestor 10 mg daily due to running out of refills. Refilled today so he will resume crestor 10 mg daily and needs lipid panel in 3-6 months.

## 2022-01-07 NOTE — Progress Notes (Signed)
   Subjective:   Patient ID: Ronald Castaneda, male    DOB: 16-Mar-1956, 66 y.o.   MRN: [id number]  HPI The patient is a 66 YO man coming in for cholesterol.   Review of Systems  Constitutional: Negative.   HENT: Negative.    Eyes: Negative.   Respiratory:  Negative for cough, chest tightness and shortness of breath.   Cardiovascular:  Negative for chest pain, palpitations and leg swelling.  Gastrointestinal:  Negative for abdominal distention, abdominal pain, constipation, diarrhea, nausea and vomiting.  Musculoskeletal: Negative.   Skin: Negative.   Neurological: Negative.   Psychiatric/Behavioral: Negative.      Objective:  Physical Exam Constitutional:      Appearance: He is well-developed.  HENT:     Head: Normocephalic and atraumatic.  Cardiovascular:     Rate and Rhythm: Normal rate and regular rhythm.  Pulmonary:     Effort: Pulmonary effort is normal. No respiratory distress.     Breath sounds: Normal breath sounds. No wheezing or rales.  Abdominal:     General: Bowel sounds are normal. There is no distension.     Palpations: Abdomen is soft.     Tenderness: There is no abdominal tenderness. There is no rebound.  Musculoskeletal:     Cervical back: Normal range of motion.  Skin:    General: Skin is warm and dry.  Neurological:     Mental Status: He is alert and oriented to person, place, and time.     Coordination: Coordination normal.     Vitals:   01/07/22 0824 01/07/22 0830 01/07/22 0846  BP: (!) 148/98 (!) 152/100 139/86  Pulse: 60    Temp: 98.1 F (36.7 C)    TempSrc: Oral    SpO2: 98%    Weight: 168 lb 12.8 oz (76.6 kg)    Height: '5\' 9"'$  (1.753 m)      Assessment & Plan:

## 2022-01-07 NOTE — Assessment & Plan Note (Signed)
Reviewed calcium score findings and recommendation for lifelong good BP control and statin. Refilled crestor 10 mg daily. He had stopped taking this due to lack of refills but did not have problem with the medication.

## 2022-01-07 NOTE — Patient Instructions (Signed)
We will start you back on the cholesterol medicine.

## 2022-01-22 ENCOUNTER — Encounter: Payer: Self-pay | Admitting: Internal Medicine

## 2022-01-22 ENCOUNTER — Ambulatory Visit (INDEPENDENT_AMBULATORY_CARE_PROVIDER_SITE_OTHER): Payer: No Typology Code available for payment source | Admitting: Internal Medicine

## 2022-01-22 DIAGNOSIS — R0789 Other chest pain: Secondary | ICD-10-CM | POA: Diagnosis not present

## 2022-01-22 DIAGNOSIS — M549 Dorsalgia, unspecified: Secondary | ICD-10-CM

## 2022-01-22 DIAGNOSIS — M542 Cervicalgia: Secondary | ICD-10-CM | POA: Diagnosis not present

## 2022-01-22 DIAGNOSIS — I10 Essential (primary) hypertension: Secondary | ICD-10-CM | POA: Diagnosis not present

## 2022-01-22 MED ORDER — CYCLOBENZAPRINE HCL 5 MG PO TABS: 5.0000 mg | ORAL_TABLET | Freq: Three times a day (TID) | ORAL | 1 refills | Status: DC | PRN

## 2022-01-22 MED ORDER — MELOXICAM 15 MG PO TABS: 15.0000 mg | ORAL_TABLET | Freq: Every day | ORAL | 0 refills | Status: DC

## 2022-01-22 MED ORDER — AMLODIPINE BESYLATE 10 MG PO TABS: 10.0000 mg | ORAL_TABLET | Freq: Every day | ORAL | 3 refills | Status: DC
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
## 2022-01-22 NOTE — Assessment & Plan Note (Signed)
Reviewed imaging no fracture. Rx cyclobenzaprine and meloxicam
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
## 2022-01-22 NOTE — Assessment & Plan Note (Signed)
Reviewed imaging no fractured ribs. Rx cyclobenzaprine and meloxicam to help with pain.

## 2022-01-22 NOTE — Assessment & Plan Note (Signed)
Not taking any BP meds. BP is high today. Resume amlodipine 10 mg daily new rx done as he does not have currently.
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
## 2022-02-02 DIAGNOSIS — Z0289 Encounter for other administrative examinations: Secondary | ICD-10-CM

## 2022-02-04 ENCOUNTER — Telehealth: Payer: Self-pay | Admitting: *Deleted

## 2022-02-04 NOTE — Telephone Encounter (Signed)
Notified pt--Short Term Disability Form-is ready to pick up at the front office.

## 2022-02-05 NOTE — Telephone Encounter (Signed)
Sent and faxed complete form--[phone]

## 2022-02-05 NOTE — Telephone Encounter (Signed)
Ronald Castaneda with [employer] called to follow up on form they sent on 02/01/22. They asked for a call back at [place]

## 2022-02-08 ENCOUNTER — Encounter: Payer: Self-pay | Admitting: Internal Medicine

## 2022-02-08 ENCOUNTER — Ambulatory Visit (INDEPENDENT_AMBULATORY_CARE_PROVIDER_SITE_OTHER): Payer: 59 | Admitting: Internal Medicine

## 2022-02-08 DIAGNOSIS — M549 Dorsalgia, unspecified: Secondary | ICD-10-CM | POA: Diagnosis not present

## 2022-02-08 DIAGNOSIS — N529 Male erectile dysfunction, unspecified: Secondary | ICD-10-CM

## 2022-02-08 DIAGNOSIS — J410 Simple chronic bronchitis: Secondary | ICD-10-CM | POA: Diagnosis not present

## 2022-02-08 MED ORDER — SILDENAFIL CITRATE 100 MG PO TABS: 100.0000 mg | ORAL_TABLET | Freq: Every day | ORAL | 5 refills | Status: DC | PRN

## 2022-02-08 MED ORDER — ALBUTEROL SULFATE HFA 108 (90 BASE) MCG/ACT IN AERS: 2.0000 | INHALATION_SPRAY | Freq: Four times a day (QID) | RESPIRATORY_TRACT | 2 refills | Status: DC | PRN
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
## 2022-02-08 NOTE — Patient Instructions (Signed)
We have sent in the albuterol inhaler to use if needed.  We have sent in the generic viagra medicine to use if needed.

## 2022-02-09 ENCOUNTER — Telehealth: Payer: Self-pay | Admitting: *Deleted

## 2022-02-09 NOTE — Telephone Encounter (Signed)
Employee release form-is complete and sign-ready to pick up copy at the front office.

## 2022-02-09 NOTE — Telephone Encounter (Signed)
Patient came by and picked up

## 2022-02-11 ENCOUNTER — Encounter: Payer: Self-pay | Admitting: Internal Medicine

## 2022-02-11 NOTE — Assessment & Plan Note (Signed)
Advised to quit smoking and rx albuterol to use when needed.

## 2022-02-11 NOTE — Assessment & Plan Note (Signed)
Filled out additional forms for his return to work next Monday. He is using flexeril and ibuprofen for pain. Continue to increase activity gradually.

## 2022-02-11 NOTE — Assessment & Plan Note (Signed)
Rx viagra 100 mg daily prn to help.

## 2022-02-26 ENCOUNTER — Other Ambulatory Visit: Payer: 59

## 2022-02-26 ENCOUNTER — Ambulatory Visit (INDEPENDENT_AMBULATORY_CARE_PROVIDER_SITE_OTHER): Payer: 59 | Admitting: Internal Medicine

## 2022-02-26 ENCOUNTER — Encounter (HOSPITAL_COMMUNITY): Payer: Self-pay

## 2022-02-26 ENCOUNTER — Other Ambulatory Visit: Payer: Self-pay

## 2022-02-26 ENCOUNTER — Telehealth: Payer: Self-pay | Admitting: *Deleted

## 2022-02-26 ENCOUNTER — Inpatient Hospital Stay (HOSPITAL_COMMUNITY)
Admission: EM | Admit: 2022-02-26 | Discharge: 2022-03-01 | DRG: 638 | Disposition: A | Discharge status: Home / Self Care | Payer: No Typology Code available for payment source | Attending: Family Medicine | Admitting: Family Medicine

## 2022-02-26 ENCOUNTER — Encounter: Payer: Self-pay | Admitting: Internal Medicine

## 2022-02-26 VITALS — BP 122/72 | HR 87 | Temp 97.6°F | Ht 69.0 in | Wt 153.0 lb

## 2022-02-26 DIAGNOSIS — E1310 Other specified diabetes mellitus with ketoacidosis without coma: Principal | ICD-10-CM

## 2022-02-26 DIAGNOSIS — N179 Acute kidney failure, unspecified: Secondary | ICD-10-CM | POA: Diagnosis not present

## 2022-02-26 DIAGNOSIS — E1110 Type 2 diabetes mellitus with ketoacidosis without coma: Secondary | ICD-10-CM | POA: Diagnosis not present

## 2022-02-26 DIAGNOSIS — J449 Chronic obstructive pulmonary disease, unspecified: Secondary | ICD-10-CM | POA: Diagnosis present

## 2022-02-26 DIAGNOSIS — E876 Hypokalemia: Secondary | ICD-10-CM | POA: Diagnosis not present

## 2022-02-26 DIAGNOSIS — F3342 Major depressive disorder, recurrent, in full remission: Secondary | ICD-10-CM

## 2022-02-26 DIAGNOSIS — E7800 Pure hypercholesterolemia, unspecified: Secondary | ICD-10-CM | POA: Diagnosis present

## 2022-02-26 DIAGNOSIS — R0609 Other forms of dyspnea: Secondary | ICD-10-CM | POA: Diagnosis not present

## 2022-02-26 DIAGNOSIS — F32A Depression, unspecified: Secondary | ICD-10-CM | POA: Diagnosis present

## 2022-02-26 DIAGNOSIS — I10 Essential (primary) hypertension: Secondary | ICD-10-CM | POA: Diagnosis not present

## 2022-02-26 DIAGNOSIS — Z7982 Long term (current) use of aspirin: Secondary | ICD-10-CM

## 2022-02-26 DIAGNOSIS — Z8249 Family history of ischemic heart disease and other diseases of the circulatory system: Secondary | ICD-10-CM

## 2022-02-26 DIAGNOSIS — E1169 Type 2 diabetes mellitus with other specified complication: Secondary | ICD-10-CM | POA: Diagnosis present

## 2022-02-26 DIAGNOSIS — Z87891 Personal history of nicotine dependence: Secondary | ICD-10-CM

## 2022-02-26 DIAGNOSIS — J410 Simple chronic bronchitis: Secondary | ICD-10-CM | POA: Diagnosis not present

## 2022-02-26 DIAGNOSIS — R631 Polydipsia: Secondary | ICD-10-CM

## 2022-02-26 DIAGNOSIS — Z833 Family history of diabetes mellitus: Secondary | ICD-10-CM

## 2022-02-26 DIAGNOSIS — Z79899 Other long term (current) drug therapy: Secondary | ICD-10-CM

## 2022-02-26 DIAGNOSIS — B20 Human immunodeficiency virus [HIV] disease: Secondary | ICD-10-CM | POA: Diagnosis present

## 2022-02-26 DIAGNOSIS — D72829 Elevated white blood cell count, unspecified: Secondary | ICD-10-CM | POA: Diagnosis present

## 2022-02-26 HISTORY — DX: Type 2 diabetes mellitus without complications: E11.9

## 2022-02-26 LAB — URINALYSIS, ROUTINE W REFLEX MICROSCOPIC
Bacteria, UA: NONE SEEN
Bilirubin Urine: NEGATIVE
Glucose, UA: >=500 mg/dL — AB
Ketones, ur: 5 mg/dL — AB
Leukocytes,Ua: NEGATIVE
Nitrite: NEGATIVE
Protein, ur: NEGATIVE mg/dL
Specific Gravity, Urine: 1.023 (ref 1.005–1.030)
pH: 5 (ref 5.0–8.0)

## 2022-02-26 LAB — BLOOD GAS, VENOUS
Acid-Base Excess: 4.8 mmol/L — ABNORMAL HIGH (ref 0.0–2.0)
Bicarbonate: 29.9 mmol/L — ABNORMAL HIGH (ref 20.0–28.0)
O2 Saturation: 54.1 %
Patient temperature: 36.4
pCO2, Ven: 44 mmHg (ref 44–60)
pH, Ven: 7.44 — ABNORMAL HIGH (ref 7.25–7.43)
pO2, Ven: 32 mmHg (ref 32–45)

## 2022-02-26 LAB — CBC
HCT: 47.5 % (ref 39.0–52.0)
HCT: 46.9 % (ref 39.0–52.0)
Hemoglobin: 16.1 g/dL (ref 13.0–17.0)
Hemoglobin: 17 g/dL (ref 13.0–17.0)
MCH: 27.8 pg (ref 26.0–34.0)
MCHC: 33.8 g/dL (ref 30.0–36.0)
MCHC: 36.2 g/dL — ABNORMAL HIGH (ref 30.0–36.0)
MCV: 81.4 fl (ref 78.0–100.0)
MCV: 76.6 fL — ABNORMAL LOW (ref 80.0–100.0)
Platelets: 319 10*3/uL (ref 150.0–400.0)
Platelets: 373 10*3/uL (ref 150–400)
RBC: 5.83 Mil/uL — ABNORMAL HIGH (ref 4.22–5.81)
RBC: 6.12 MIL/uL — ABNORMAL HIGH (ref 4.22–5.81)
RDW: 14.6 % (ref 11.5–15.5)
RDW: 13.2 % (ref 11.5–15.5)
WBC: 10.6 10*3/uL — ABNORMAL HIGH (ref 4.0–10.5)
WBC: 12.2 10*3/uL — ABNORMAL HIGH (ref 4.0–10.5)
nRBC: 0 % (ref 0.0–0.2)

## 2022-02-26 LAB — CBG MONITORING, ED
Glucose-Capillary: >600 mg/dL (ref 70–99)
Glucose-Capillary: >600 mg/dL (ref 70–99)
Glucose-Capillary: >600 mg/dL (ref 70–99)
Glucose-Capillary: >600 mg/dL (ref 70–99)
Glucose-Capillary: 457 mg/dL — ABNORMAL HIGH (ref 70–99)
Glucose-Capillary: 581 mg/dL (ref 70–99)
Glucose-Capillary: 448 mg/dL — ABNORMAL HIGH (ref 70–99)

## 2022-02-26 LAB — BASIC METABOLIC PANEL
Anion gap: 19 — ABNORMAL HIGH (ref 5–15)
Anion gap: 20 — ABNORMAL HIGH (ref 5–15)
Anion gap: 13 (ref 5–15)
Anion gap: 10 (ref 5–15)
BUN: 23 mg/dL (ref 8–23)
BUN: 28 mg/dL — ABNORMAL HIGH (ref 8–23)
BUN: 22 mg/dL (ref 8–23)
BUN: 19 mg/dL (ref 8–23)
CO2: 21 mmol/L — ABNORMAL LOW (ref 22–32)
CO2: 18 mmol/L — ABNORMAL LOW (ref 22–32)
CO2: 24 mmol/L (ref 22–32)
CO2: 23 mmol/L (ref 22–32)
Calcium: 9.3 mg/dL (ref 8.9–10.3)
Calcium: 9.2 mg/dL (ref 8.9–10.3)
Calcium: 9.3 mg/dL (ref 8.9–10.3)
Calcium: 8.9 mg/dL (ref 8.9–10.3)
Chloride: 88 mmol/L — ABNORMAL LOW (ref 98–111)
Chloride: 88 mmol/L — ABNORMAL LOW (ref 98–111)
Chloride: 100 mmol/L (ref 98–111)
Chloride: 102 mmol/L (ref 98–111)
Creatinine, Ser: 2.02 mg/dL — ABNORMAL HIGH (ref 0.61–1.24)
Creatinine, Ser: 2.24 mg/dL — ABNORMAL HIGH (ref 0.61–1.24)
Creatinine, Ser: 1.56 mg/dL — ABNORMAL HIGH (ref 0.61–1.24)
Creatinine, Ser: 1.44 mg/dL — ABNORMAL HIGH (ref 0.61–1.24)
GFR, Estimated: 36 mL/min — ABNORMAL LOW (ref >60)
GFR, Estimated: 32 mL/min — ABNORMAL LOW (ref >60)
GFR, Estimated: 49 mL/min — ABNORMAL LOW (ref >60)
GFR, Estimated: 54 mL/min — ABNORMAL LOW (ref >60)
Glucose, Bld: 739 mg/dL (ref 70–99)
Glucose, Bld: 881 mg/dL (ref 70–99)
Glucose, Bld: 380 mg/dL — ABNORMAL HIGH (ref 70–99)
Glucose, Bld: 324 mg/dL — ABNORMAL HIGH (ref 70–99)
Potassium: 4.9 mmol/L (ref 3.5–5.1)
Potassium: 4.6 mmol/L (ref 3.5–5.1)
Potassium: 4 mmol/L (ref 3.5–5.1)
Potassium: 4.1 mmol/L (ref 3.5–5.1)
Sodium: 128 mmol/L — ABNORMAL LOW (ref 135–145)
Sodium: 126 mmol/L — ABNORMAL LOW (ref 135–145)
Sodium: 137 mmol/L (ref 135–145)
Sodium: 135 mmol/L (ref 135–145)

## 2022-02-26 LAB — COMPREHENSIVE METABOLIC PANEL
ALT: 42 U/L (ref 0–53)
AST: 18 U/L (ref 0–37)
Albumin: 4.2 g/dL (ref 3.5–5.2)
Alkaline Phosphatase: 166 U/L — ABNORMAL HIGH (ref 39–117)
BUN: 23 mg/dL (ref 6–23)
CO2: 22 mEq/L (ref 19–32)
Calcium: 9.5 mg/dL (ref 8.4–10.5)
Chloride: 86 mEq/L — ABNORMAL LOW (ref 96–112)
Creatinine, Ser: 1.91 mg/dL — ABNORMAL HIGH (ref 0.40–1.50)
GFR: 36.21 mL/min — ABNORMAL LOW (ref >60.00)
Glucose, Bld: 744 mg/dL (ref 70–99)
Potassium: 4.2 mEq/L (ref 3.5–5.1)
Sodium: 126 mEq/L — ABNORMAL LOW (ref 135–145)
Total Bilirubin: 0.6 mg/dL (ref 0.2–1.2)
Total Protein: 9.3 g/dL — ABNORMAL HIGH (ref 6.0–8.3)

## 2022-02-26 LAB — HEPATIC FUNCTION PANEL
ALT: 47 U/L — ABNORMAL HIGH (ref 0–44)
AST: 27 U/L (ref 15–41)
Albumin: 3.9 g/dL (ref 3.5–5.0)
Alkaline Phosphatase: 150 U/L — ABNORMAL HIGH (ref 38–126)
Bilirubin, Direct: 0.1 mg/dL (ref 0.0–0.2)
Indirect Bilirubin: 0.9 mg/dL (ref 0.3–0.9)
Total Bilirubin: 1 mg/dL (ref 0.3–1.2)
Total Protein: 9.2 g/dL — ABNORMAL HIGH (ref 6.5–8.1)

## 2022-02-26 LAB — GLUCOSE, CAPILLARY
Glucose-Capillary: 290 mg/dL — ABNORMAL HIGH (ref 70–99)
Glucose-Capillary: 337 mg/dL — ABNORMAL HIGH (ref 70–99)
Glucose-Capillary: 384 mg/dL — ABNORMAL HIGH (ref 70–99)
Glucose-Capillary: 457 mg/dL — ABNORMAL HIGH (ref 70–99)

## 2022-02-26 LAB — VITAMIN B12: Vitamin B-12: 997 pg/mL — ABNORMAL HIGH (ref 211–911)

## 2022-02-26 LAB — TSH: TSH: 1.76 u[IU]/mL (ref 0.35–5.50)

## 2022-02-26 LAB — LIPASE, BLOOD: Lipase: 48 U/L (ref 11–51)

## 2022-02-26 LAB — BETA-HYDROXYBUTYRIC ACID
Beta-Hydroxybutyric Acid: 3.73 mmol/L — ABNORMAL HIGH (ref 0.05–0.27)
Beta-Hydroxybutyric Acid: 3.35 mmol/L — ABNORMAL HIGH (ref 0.05–0.27)
Beta-Hydroxybutyric Acid: 0.44 mmol/L — ABNORMAL HIGH (ref 0.05–0.27)

## 2022-02-26 LAB — HEMOGLOBIN A1C
Hgb A1c MFr Bld: 11.8 % — ABNORMAL HIGH (ref 4.8–5.6)
Mean Plasma Glucose: 291.96 mg/dL

## 2022-02-26 LAB — VITAMIN D 25 HYDROXY (VIT D DEFICIENCY, FRACTURES): VITD: 17.94 ng/mL — ABNORMAL LOW (ref 30.00–100.00)

## 2022-02-26 MED ORDER — ALBUTEROL SULFATE (2.5 MG/3ML) 0.083% IN NEBU: 3.0000 mL | INHALATION_SOLUTION | Freq: Four times a day (QID) | RESPIRATORY_TRACT | Status: DC | PRN

## 2022-02-26 MED ORDER — DEXTROSE IN LACTATED RINGERS 5 % IV SOLN: INTRAVENOUS | Status: DC

## 2022-02-26 MED ORDER — DOLUTEGRAVIR SODIUM 50 MG PO TABS
50.0000 mg | ORAL_TABLET | Freq: Every day | ORAL | Status: DC
  Administered 2022-02-27 – 2022-03-01 (×3): 50 mg via ORAL
  Filled 2022-02-26 (×3): qty 1

## 2022-02-26 MED ORDER — POTASSIUM CHLORIDE 10 MEQ/100ML IV SOLN
10.0000 meq | INTRAVENOUS | Status: AC
  Administered 2022-02-26 (×2): 10 meq via INTRAVENOUS
  Filled 2022-02-26 (×2): qty 100

## 2022-02-26 MED ORDER — LACTATED RINGERS IV SOLN: INTRAVENOUS | Status: DC

## 2022-02-26 MED ORDER — DARUN-COBIC-EMTRICIT-TENOFAF 800-150-200-10 MG PO TABS
1.0000 | ORAL_TABLET | Freq: Every day | ORAL | Status: DC
  Administered 2022-02-27 – 2022-03-01 (×3): 1 via ORAL
  Filled 2022-02-26 (×5): qty 1

## 2022-02-26 MED ORDER — INSULIN REGULAR(HUMAN) IN NACL 100-0.9 UT/100ML-% IV SOLN
INTRAVENOUS | Status: DC
  Administered 2022-02-26: 4.4 [IU]/h via INTRAVENOUS
  Administered 2022-02-26: 4.6 [IU]/h via INTRAVENOUS
  Filled 2022-02-26: qty 100

## 2022-02-26 MED ORDER — ENOXAPARIN SODIUM 40 MG/0.4ML IJ SOSY
40.0000 mg | PREFILLED_SYRINGE | INTRAMUSCULAR | Status: DC
  Administered 2022-02-26 – 2022-02-28 (×3): 40 mg via SUBCUTANEOUS
  Filled 2022-02-26 (×3): qty 0.4

## 2022-02-26 MED ORDER — ROSUVASTATIN CALCIUM 5 MG PO TABS
10.0000 mg | ORAL_TABLET | Freq: Every day | ORAL | Status: DC
  Administered 2022-02-27 – 2022-03-01 (×3): 10 mg via ORAL
  Filled 2022-02-26 (×3): qty 2

## 2022-02-26 MED ORDER — LACTATED RINGERS IV BOLUS
20.0000 mL/kg | Freq: Once | INTRAVENOUS | Status: AC
  Administered 2022-02-26: 1388 mL via INTRAVENOUS

## 2022-02-26 MED ORDER — DEXTROSE 50 % IV SOLN: 0.0000 mL | INTRAVENOUS | Status: DC | PRN

## 2022-02-26 NOTE — ED Notes (Signed)
ED TO INPATIENT HANDOFF REPORT  ED Nurse Name and Phone #: Casey Burkitt [phone]  S Name/Age/Gender Ronald Castaneda 66 y.o. male Room/Bed: 031C/031C  Code Status   Code Status: Full Code  Home/SNF/Other Home Patient oriented to: self, place, time, and situation Is this baseline? Yes   Triage Complete: Triage complete  Chief Complaint DKA (diabetic ketoacidosis) ([place]) [E11.10]  Triage Note Pt BIB [facility] from home d/t his PCP stating that his blood work & assessment looks like he has diabetes & requires further evaluation since he has been having increased thirst, urination & weakness. A/Ox4, CBG 342, 130/94, 98% on RA, RR 18, 103 bpm, 18g Lt AC PIV.  Sent by PCP due to CBG 744  Patient having excessive thirst and weakness.    Allergies No Known Allergies  Level of Care/Admitting Diagnosis ED Disposition     ED Disposition  Admit   Condition  --   Comment  Hospital Area: [facility] [[id number]]  Level of Care: Progressive [102]  Admit to Progressive based on following criteria: GI, ENDOCRINE disease patients with GI bleeding, acute liver failure or pancreatitis, stable with diabetic ketoacidosis or thyrotoxicosis (hypothyroid) state.  May place patient in observation at [facility] or [facility] if equivalent level of care is available:: No  Covid Evaluation: Asymptomatic - no recent exposure (last 10 days) testing not required  Diagnosis: DKA (diabetic ketoacidosis) [facility]) [[id number]]  Admitting Physician: Marcelyn Bruins [[id number]]  Attending Physician: Marcelyn Bruins [phone]          B Medical/Surgery History Past Medical History:  Diagnosis Date   Depression    Diabetes mellitus without complication ([place])    HIV infection ([place])    Tobacco use    Past Surgical History:  Procedure Laterality Date   None       A IV Location/Drains/Wounds Patient Lines/Drains/Airways Status     Active Line/Drains/Airways     Name Placement date  Placement time Site Days   Peripheral IV 02/26/22 18 G Anterior;Distal;Left;Upper Arm 02/26/22  1327  Arm  less than 1   Peripheral IV 02/26/22 20 G Anterior;Right Forearm 02/26/22  1800  Forearm  less than 1            Intake/Output Last 24 hours No intake or output data in the 24 hours ending 02/26/22 1853  Labs/Imaging Results for orders placed or performed during the hospital encounter of 02/26/22 (from the past 48 hour(s))  Urinalysis, Routine w reflex microscopic Urine, Clean Catch     Status: Abnormal   Collection Time: 02/26/22  2:07 PM  Result Value Ref Range   Color, Urine STRAW (A) YELLOW   APPearance CLEAR CLEAR   Specific Gravity, Urine 1.023 1.005 - 1.030   pH 5.0 5.0 - 8.0   Glucose, UA >=500 (A) NEGATIVE mg/dL   Hgb urine dipstick SMALL (A) NEGATIVE   Bilirubin Urine NEGATIVE NEGATIVE   Ketones, ur 5 (A) NEGATIVE mg/dL   Protein, ur NEGATIVE NEGATIVE mg/dL   Nitrite NEGATIVE NEGATIVE   Leukocytes,Ua NEGATIVE NEGATIVE   RBC / HPF 0-5 0 - 5 RBC/hpf   WBC, UA 0-5 0 - 5 WBC/hpf   Bacteria, UA NONE SEEN NONE SEEN   Squamous Epithelial / LPF 0-5 0 - 5    Comment: Performed at [facility], [place] [address], [place], [place] [address]  Beta-hydroxybutyric acid     Status: Abnormal   Collection Time: 02/26/22  2:07 PM  Result Value Ref  Range   Beta-Hydroxybutyric Acid 3.73 (H) 0.05 - 0.27 mmol/L    Comment: Performed at [facility] [address] [place], [place] [address]  Basic metabolic panel     Status: Abnormal   Collection Time: 02/26/22  2:16 PM  Result Value Ref Range   Sodium 128 (L) 135 - 145 mmol/L   Potassium 4.9 3.5 - 5.1 mmol/L   Chloride 88 (L) 98 - 111 mmol/L   CO2 21 (L) 22 - 32 mmol/L   Glucose, Bld 739 (HH) 70 - 99 mg/dL    Comment: CRITICAL RESULT CALLED TO, READ BACK BY AND VERIFIED WITH DR.LOCKWOOD '@1652'$  02/26/2022 VANG.J Glucose reference range applies only to samples taken after fasting for at least 8 hours.    BUN 23 8 -  23 mg/dL   Creatinine, Ser 2.02 (H) 0.61 - 1.24 mg/dL   Calcium 9.3 8.9 - 10.3 mg/dL   GFR, Estimated 36 (L) >60 mL/min    Comment: (NOTE) Calculated using the CKD-EPI Creatinine Equation (2021)    Anion gap 19 (H) 5 - 15    Comment: Performed at [facility] [address] [place], [place] [address]  CBC     Status: Abnormal   Collection Time: 02/26/22  2:16 PM  Result Value Ref Range   WBC 12.2 (H) 4.0 - 10.5 K/uL   RBC 6.12 (H) 4.22 - 5.81 MIL/uL   Hemoglobin 17.0 13.0 - 17.0 g/dL   HCT 46.9 39.0 - 52.0 %   MCV 76.6 (L) 80.0 - 100.0 fL   MCH 27.8 26.0 - 34.0 pg   MCHC 36.2 (H) 30.0 - 36.0 g/dL   RDW 13.2 11.5 - 15.5 %   Platelets 373 150 - 400 K/uL   nRBC 0.0 0.0 - 0.2 %    Comment: Performed at [facility], [place] [address] [place],  [address]  CBG monitoring, ED     Status: Abnormal   Collection Time: 02/26/22  2:29 PM  Result Value Ref Range   Glucose-Capillary >600 (HH) 70 - 99 mg/dL    Comment: Glucose reference range applies only to samples taken after fasting for at least 8 hours.  CBG monitoring, ED     Status: Abnormal   Collection Time: 02/26/22  5:17 PM  Result Value Ref Range   Glucose-Capillary >600 (HH) 70 - 99 mg/dL    Comment: Glucose reference range applies only to samples taken after fasting for at least 8 hours.  Basic metabolic panel     Status: Abnormal   Collection Time: 02/26/22  5:57 PM  Result Value Ref Range   Sodium 126 (L) 135 - 145 mmol/L   Potassium 4.6 3.5 - 5.1 mmol/L   Chloride 88 (L) 98 - 111 mmol/L   CO2 18 (L) 22 - 32 mmol/L   Glucose, Bld 881 (HH) 70 - 99 mg/dL    Comment: CRITICAL RESULT CALLED TO, READ BACK BY AND VERIFIED WITH A.Salvador Coupe,RN '@1848'$  02/26/2022 VANG.J Glucose reference range applies only to samples taken after fasting for at least 8 hours.    BUN 28 (H) 8 - 23 mg/dL   Creatinine, Ser 2.24 (H) 0.61 - 1.24 mg/dL   Calcium 9.2 8.9 - 10.3 mg/dL   GFR, Estimated 32 (L) >60 mL/min    Comment:  (NOTE) Calculated using the CKD-EPI Creatinine Equation (2021)    Anion gap 20 (H) 5 - 15    Comment: Electrolytes repeated to confirm. Performed at [facility], [place] [address] [place], [place] [address]   Beta-hydroxybutyric acid     Status: Abnormal   Collection Time: 02/26/22  5:57 PM  Result Value Ref Range   Beta-Hydroxybutyric Acid 3.35 (H) 0.05 - 0.27 mmol/L    Comment: Performed at [facility] [address] [place], [place] [address]  Hepatic function panel     Status: Abnormal   Collection Time: 02/26/22  5:57 PM  Result Value Ref Range   Total Protein 9.2 (H) 6.5 - 8.1 g/dL   Albumin 3.9 3.5 - 5.0 g/dL   AST 27 15 - 41 U/L   ALT 47 (H) 0 - 44 U/L   Alkaline Phosphatase 150 (H) 38 - 126 U/L   Total Bilirubin 1.0 0.3 - 1.2 mg/dL   Bilirubin, Direct 0.1 0.0 - 0.2 mg/dL   Indirect Bilirubin 0.9 0.3 - 0.9 mg/dL    Comment: Performed at [facility] [address], [place], [place] [address]  Lipase, blood     Status: None   Collection Time: 02/26/22  5:57 PM  Result Value Ref Range   Lipase 48 11 - 51 U/L    Comment: Performed at [facility] [address] [place], [place] [address]  Hemoglobin A1c     Status: Abnormal   Collection Time: 02/26/22  5:57 PM  Result Value Ref Range   Hgb A1c MFr Bld 11.8 (H) 4.8 - 5.6 %    Comment: (NOTE) Pre diabetes:          5.7%-6.4%  Diabetes:              >6.4%  Glycemic control for   <7.0% adults with diabetes    Mean Plasma Glucose 291.96 mg/dL    Comment: Performed at [facility] [address], [place], [place] [address]  CBG monitoring, ED     Status: Abnormal   Collection Time: 02/26/22  6:31 PM  Result Value Ref Range   Glucose-Capillary >600 (HH) 70 - 99 mg/dL    Comment: Glucose reference range applies only to samples taken after fasting for at least 8 hours.   No results found.  Pending Labs Unresulted Labs (From admission, onward)     Start     Ordered   03/05/22 0500   Creatinine, serum  (enoxaparin (LOVENOX)    CrCl >/= 30 ml/min)  Weekly,   R     Comments: while on enoxaparin therapy    02/26/22 1841   02/26/22 1841  Blood gas, venous  (Diabetes Ketoacidosis (DKA))  Once,   R        02/26/22 1841   02/26/22 5176  Basic metabolic panel  (Diabetes Ketoacidosis (DKA))  STAT Now then every 4 hours ,   STAT      02/26/22 1729   02/26/22 1729  Beta-hydroxybutyric acid  (Diabetes Ketoacidosis (DKA))  Now then every 8 hours,   STAT (with URGENT occurrences)      02/26/22 1729            Vitals/Pain Today's Vitals   02/26/22 1543 02/26/22 1719 02/26/22 1800 02/26/22 1807  BP: (!) 125/90 (!) 149/101 (!) 136/93   Pulse: 100 95 (!) 105   Resp: 15 20 (!) 21   Temp: 98 F (36.7 C) 97.6 F (36.4 C)    TempSrc: Oral Oral    SpO2: 97% 96% 95% 95%  Weight:      Height:  PainSc: 0-No pain 0-No pain      Isolation Precautions No active isolations  Medications Medications  insulin regular, human (MYXREDLIN) 100 units/ 100 mL infusion (4.6 Units/hr Intravenous New Bag/Given 02/26/22 1802)  lactated ringers infusion (0 mLs Intravenous Hold 02/26/22 1743)  dextrose 5 % in lactated ringers infusion (0 mLs Intravenous Hold 02/26/22 1744)  dextrose 50 % solution 0-50 mL (has no administration in time range)  potassium chloride 10 mEq in 100 mL IVPB (10 mEq Intravenous New Bag/Given 02/26/22 1758)  Darunavir-Cobicistat-Emtricitabine-Tenofovir Alafenamide (SYMTUZA) 800-150-200-10 MG TABS 1 tablet (has no administration in time range)  dolutegravir (TIVICAY) tablet 50 mg (has no administration in time range)  rosuvastatin (CRESTOR) tablet 10 mg (has no administration in time range)  albuterol (VENTOLIN HFA) 108 (90 Base) MCG/ACT inhaler 2 puff (has no administration in time range)  enoxaparin (LOVENOX) injection 40 mg (has no administration in time range)  lactated ringers infusion (has no administration in time range)  lactated ringers bolus 1,388 mL (1,388  mLs Intravenous New Bag/Given 02/26/22 1754)    Mobility walks with person assist Low fall risk   Focused Assessments    R Recommendations: See Admitting Provider Note  Report given to:   Additional Notes:

## 2022-02-26 NOTE — Progress Notes (Signed)
   Subjective:   Patient ID: Ronald Castaneda, male    DOB: 10-30-1955, 66 y.o.   MRN: [id number]  HPI The patient is a 66 YO man coming in for follow up pain. Also feeling tired and more thirsty. Not feeling well.  Review of Systems  Constitutional:  Positive for activity change and fatigue.  HENT: Negative.    Eyes: Negative.   Respiratory:  Negative for cough, chest tightness and shortness of breath.   Cardiovascular:  Negative for chest pain, palpitations and leg swelling.  Gastrointestinal:  Negative for abdominal distention, abdominal pain, constipation, diarrhea, nausea and vomiting.  Endocrine: Positive for polydipsia and polyuria.  Musculoskeletal:  Positive for arthralgias and myalgias.  Skin: Negative.   Neurological: Negative.   Psychiatric/Behavioral: Negative.      Objective:  Physical Exam Constitutional:      Appearance: He is well-developed.  HENT:     Head: Normocephalic and atraumatic.  Cardiovascular:     Rate and Rhythm: Normal rate and regular rhythm.  Pulmonary:     Effort: Pulmonary effort is normal. No respiratory distress.     Breath sounds: Normal breath sounds. No wheezing or rales.  Abdominal:     General: Bowel sounds are normal. There is no distension.     Palpations: Abdomen is soft.     Tenderness: There is no abdominal tenderness. There is no rebound.  Musculoskeletal:     Cervical back: Normal range of motion.  Skin:    General: Skin is warm and dry.  Neurological:     Mental Status: He is alert and oriented to person, place, and time.     Coordination: Coordination normal.     Vitals:   02/26/22 0904  BP: 122/72  Pulse: 87  Temp: 97.6 F (36.4 C)  SpO2: 99%  Weight: 153 lb (69.4 kg)  Height: '5\' 9"'$  (1.753 m)    Assessment & Plan:

## 2022-02-26 NOTE — Telephone Encounter (Signed)
Spoke to pt--agreed to go to ER but not able to drive, but requested to call 911 for him. Called 911 .

## 2022-02-26 NOTE — ED Notes (Signed)
MD Trilby Drummer made aware of repeat glucose 881, prior to start of treatment.

## 2022-02-26 NOTE — ED Triage Notes (Signed)
Pt BIB [facility] from home d/t his PCP stating that his blood work & assessment looks like he has diabetes & requires further evaluation since he has been having increased thirst, urination & weakness. A/Ox4, CBG 342, 130/94, 98% on RA, RR 18, 103 bpm, 18g Lt AC PIV.

## 2022-02-26 NOTE — ED Provider Triage Note (Addendum)
Emergency Medicine Provider Triage Evaluation Note  Ronald Castaneda , a 66 y.o. male  was evaluated in triage.  Pt complains of increased thirst and urination.  Recommended by PCP from recent CBG of 744 to come to the ED for further evaluation.  CBG 342 today on arrival.  Denies abdominal pain, N/V/D, headache, diarrhea, constipation, fevers, though endorses chills.  Review of Systems  Positive:  Negative: See above  Physical Exam  BP (!) 126/95 (BP Location: Right Arm)   Pulse (!) 110   Temp 98.1 F (36.7 C) (Oral)   Resp 16   Ht '5\' 9"'$  (1.753 m)   Wt 69.4 kg   SpO2 95%   BMI 22.59 kg/m  Gen:   Awake, no distress AAOx4 Resp:  Normal effort  MSK:   Moves extremities without difficulty  Other:  Abdomen soft, non-TTP.  Medical Decision Making  Medically screening exam initiated at 2:26 PM.  Appropriate orders placed.  Ronald Castaneda was informed that the remainder of the evaluation will be completed by another provider, this initial triage assessment does not replace that evaluation, and the importance of remaining in the ED until their evaluation is complete.     Prince Rome, PA-C 72/82/06 1428   UPDATE: CBG >600 via recheck at 0156   Ronald Castaneda M, [place] 15/37/94 1431

## 2022-02-26 NOTE — ED Provider Notes (Signed)
[facility] EMERGENCY DEPARTMENT Provider Note  CSN: [id number] Arrival date & time: 02/26/22 1323  Chief Complaint(s) Hyperglycemia  HPI Ronald Castaneda is a 66 y.o. male with history of COPD, HIV presenting to the emergency department with generalized weakness.  Patient reports 1 week of generalized weakness associated with polyuria, polydipsia.  He reports some chronic mild shortness of breath and has a history of COPD, might be slightly worse this week but not very much.  No chest pain, abdominal pain, nausea or vomiting.  No fevers or chills.  Symptoms moderate.  He followed up with his primary doctor today who drew lab testing revealing significant hyperglycemia.  He denies history of diabetes.   Past Medical History Past Medical History:  Diagnosis Date   Depression    Diabetes mellitus without complication ([place])    HIV infection ([place])    Tobacco use    Patient Active Problem List   Diagnosis Date Noted   Back pain 01/22/2022   Neck pain 01/22/2022   Chest wall pain 01/22/2022   Dyspnea on exertion    Atherosclerosis of aorta (HCC)    Hypercholesteremia    Benign hypertension    Smoking    COPD (chronic obstructive pulmonary disease) ([place]) 04/20/2021   Left hip pain 10/23/2020   Parotid cyst 07/20/2016   Cervical disc disorder with radiculopathy of cervical region 06/17/2016   Parotid nodule 06/17/2016   Erectile dysfunction 09/09/2015   Depression 04/01/2015   HIV disease ([place]) 02/27/2015   Routine general medical examination at a health care facility 05/03/2014   Lipoma of back 05/03/2014   Former smoker 05/03/2014   Home Medication(s) Prior to Admission medications   Medication Sig Start Date End Date Taking? Authorizing Provider  albuterol (VENTOLIN HFA) 108 (90 Base) MCG/ACT inhaler Inhale 2 puffs into the lungs every 6 (six) hours as needed for wheezing or shortness of breath. 02/08/22   Hoyt Koch, MD  amLODipine (NORVASC) 10 MG  tablet Take 1 tablet (10 mg total) by mouth daily. 01/22/22   Hoyt Koch, MD  aspirin 81 MG chewable tablet Chew 1 tablet (81 mg total) by mouth daily. 04/22/21   Thurnell Lose, MD  carvedilol (COREG) 3.125 MG tablet Take 1 tablet (3.125 mg total) by mouth 2 (two) times daily with a meal. 04/23/21   Hoyt Koch, MD  cyclobenzaprine (FLEXERIL) 5 MG tablet Take 1 tablet (5 mg total) by mouth 3 (three) times daily as needed for muscle spasms. 01/22/22   Hoyt Koch, MD  Darunavir-Cobicistat-Emtricitabine-Tenofovir Alafenamide (SYMTUZA) 800-150-200-10 MG TABS TAKE 1 TABLET BY MOUTH DAILY WITH BREAKFAST. 10/14/21 10/14/22  Michel Bickers, MD  dolutegravir (TIVICAY) 50 MG tablet TAKE 1 TABLET (50 MG TOTAL) BY MOUTH DAILY. 10/14/21 10/14/22  Michel Bickers, MD  meloxicam (MOBIC) 15 MG tablet Take 1 tablet (15 mg total) by mouth daily. 01/22/22   Hoyt Koch, MD  oxymetazoline (AFRIN) 0.05 % nasal spray Place 1 spray into both nostrils 2 (two) times daily as needed for congestion.    [provider]  rosuvastatin (CRESTOR) 10 MG tablet Take 1 tablet (10 mg total) by mouth daily. 01/07/22   Hoyt Koch, MD  sildenafil (VIAGRA) 100 MG tablet Take 1 tablet (100 mg total) by mouth daily as needed for erectile dysfunction. 02/08/22   Hoyt Koch, MD  Past Surgical History Past Surgical History:  Procedure Laterality Date   None     Family History Family History  Problem Relation Age of Onset   Hypertension Mother    Healthy Sister    Healthy Brother    Diabetes type I Daughter     Social History Social History   Tobacco Use   Smoking status: Former    Packs/day: 0.50    Years: 46.00    Total pack years: 23.00    Types: Cigarettes   Smokeless tobacco: Never   Tobacco comments:    stressed at work, unable to  decrease at this time; thinking about cutting down "it's getting old"  Vaping Use   Vaping Use: Never used  Substance Use Topics   Alcohol use: Yes    Alcohol/week: 6.0 standard drinks of alcohol    Types: 6 Cans of beer per week    Comment: weekend drinker (3 -4 beers)   Drug use: No   Allergies Patient has no known allergies.  Review of Systems Review of Systems  All other systems reviewed and are negative.   Physical Exam Vital Signs  I have reviewed the triage vital signs BP (!) 136/93   Pulse (!) 105   Temp 97.6 F (36.4 C) (Oral)   Resp (!) 21   Ht '5\' 9"'$  (1.753 m)   Wt 69.4 kg   SpO2 95%   BMI 22.59 kg/m  Physical Exam Vitals and nursing note reviewed.  Constitutional:      General: He is not in acute distress.    Appearance: Normal appearance.  HENT:     Mouth/Throat:     Mouth: Mucous membranes are dry.  Eyes:     Conjunctiva/sclera: Conjunctivae normal.  Cardiovascular:     Rate and Rhythm: Regular rhythm. Tachycardia present.  Pulmonary:     Breath sounds: Normal breath sounds.     Comments: Mild increased work of breathing and tachypnea Abdominal:     General: Abdomen is flat.     Palpations: Abdomen is soft.     Tenderness: There is no abdominal tenderness.  Musculoskeletal:     Right lower leg: No edema.     Left lower leg: No edema.  Skin:    General: Skin is warm and dry.     Capillary Refill: Capillary refill takes less than 2 seconds.  Neurological:     Mental Status: He is alert and oriented to person, place, and time. Mental status is at baseline.  Psychiatric:        Mood and Affect: Mood normal.        Behavior: Behavior normal.     ED Results and Treatments Labs (all labs ordered are listed, but only abnormal results are displayed) Labs Reviewed  BASIC METABOLIC PANEL - Abnormal; Notable for the following components:      Result Value   Sodium 128 (*)    Chloride 88 (*)    CO2 21 (*)    Glucose, Bld 739 (*)    Creatinine,  Ser 2.02 (*)    GFR, Estimated 36 (*)    Anion gap 19 (*)    All other components within normal limits  CBC - Abnormal; Notable for the following components:   WBC 12.2 (*)    RBC 6.12 (*)    MCV 76.6 (*)    MCHC 36.2 (*)    All other components within normal limits  URINALYSIS, ROUTINE W REFLEX MICROSCOPIC - Abnormal; Notable for the  following components:   Color, Urine STRAW (*)    Glucose, UA >=500 (*)    Hgb urine dipstick SMALL (*)    Ketones, ur 5 (*)    All other components within normal limits  BETA-HYDROXYBUTYRIC ACID - Abnormal; Notable for the following components:   Beta-Hydroxybutyric Acid 3.73 (*)    All other components within normal limits  HEMOGLOBIN A1C - Abnormal; Notable for the following components:   Hgb A1c MFr Bld 11.8 (*)    All other components within normal limits  CBG MONITORING, ED - Abnormal; Notable for the following components:   Glucose-Capillary >600 (*)    All other components within normal limits  CBG MONITORING, ED - Abnormal; Notable for the following components:   Glucose-Capillary >600 (*)    All other components within normal limits  CBG MONITORING, ED - Abnormal; Notable for the following components:   Glucose-Capillary >600 (*)    All other components within normal limits  BASIC METABOLIC PANEL  BASIC METABOLIC PANEL  BASIC METABOLIC PANEL  BASIC METABOLIC PANEL  BETA-HYDROXYBUTYRIC ACID  BETA-HYDROXYBUTYRIC ACID  HEPATIC FUNCTION PANEL  LIPASE, BLOOD                                                                                                                          Radiology No results found.  Pertinent labs & imaging results that were available during my care of the patient were reviewed by me and considered in my medical decision making (see MDM for details).  Medications Ordered in ED Medications  insulin regular, human (MYXREDLIN) 100 units/ 100 mL infusion (4.6 Units/hr Intravenous New Bag/Given 02/26/22 1802)   lactated ringers infusion (0 mLs Intravenous Hold 02/26/22 1743)  dextrose 5 % in lactated ringers infusion (0 mLs Intravenous Hold 02/26/22 1744)  dextrose 50 % solution 0-50 mL (has no administration in time range)  potassium chloride 10 mEq in 100 mL IVPB (10 mEq Intravenous New Bag/Given 02/26/22 1758)  lactated ringers bolus 1,388 mL (1,388 mLs Intravenous New Bag/Given 02/26/22 1754)                                                                                                                                     Procedures .Critical Care  Performed by: Cristie Hem, MD Authorized by: Cristie Hem, MD   Critical care provider statement:    Critical care time (  minutes):  30   Critical care was necessary to treat or prevent imminent or life-threatening deterioration of the following conditions:  Dehydration, endocrine crisis and metabolic crisis   Critical care was time spent personally by me on the following activities:  Development of treatment plan with patient or surrogate, discussions with consultants, evaluation of patient's response to treatment, examination of patient, ordering and review of laboratory studies, ordering and review of radiographic studies, ordering and performing treatments and interventions, pulse oximetry, re-evaluation of patient's condition and review of old charts   (including critical care time)  Medical Decision Making / ED Course   MDM:  66 year old male presenting with generalized weakness found to have hyperglycemia.  History concerning for mild DKA, labs with anion gap to 19, low CO2.  Glucose significantly elevated 739.  This likely explains patient's polyuria, polydipsia, fatigue.  Unclear why patient would suddenly have elevated glucose without clear history of diabetes.  Will check A1c.  Also obtain LFTs and lipase, he has no symptoms of pancreatitis but does drink alcohol and also has HIV and takes some medications which may have  Triggered an episode of pancreatitis.  Will initiate insulin drip and fluid resuscitation.  Patient will need admission given DKA.  Clinical Course as of 02/26/22 1840  Fri Feb 26, 2022  1832 Hemoglobin A1C(!): 11.8 [WS]  1832 Signed out to hospitalist. Suspect more of a chronic problem with acute exacerbation with Hgb A1c of 11.8 [WS]    Clinical Course User Index [WS] Cristie Hem, MD     Additional history obtained: -Additional history obtained from spouse -External records from outside source obtained and reviewed including: Chart review including previous notes, labs, imaging, consultation notes   Lab Tests: -I ordered, reviewed, and interpreted labs.   The pertinent results include:   Labs Reviewed  BASIC METABOLIC PANEL - Abnormal; Notable for the following components:      Result Value   Sodium 128 (*)    Chloride 88 (*)    CO2 21 (*)    Glucose, Bld 739 (*)    Creatinine, Ser 2.02 (*)    GFR, Estimated 36 (*)    Anion gap 19 (*)    All other components within normal limits  CBC - Abnormal; Notable for the following components:   WBC 12.2 (*)    RBC 6.12 (*)    MCV 76.6 (*)    MCHC 36.2 (*)    All other components within normal limits  URINALYSIS, ROUTINE W REFLEX MICROSCOPIC - Abnormal; Notable for the following components:   Color, Urine STRAW (*)    Glucose, UA >=500 (*)    Hgb urine dipstick SMALL (*)    Ketones, ur 5 (*)    All other components within normal limits  BETA-HYDROXYBUTYRIC ACID - Abnormal; Notable for the following components:   Beta-Hydroxybutyric Acid 3.73 (*)    All other components within normal limits  HEMOGLOBIN A1C - Abnormal; Notable for the following components:   Hgb A1c MFr Bld 11.8 (*)    All other components within normal limits  CBG MONITORING, ED - Abnormal; Notable for the following components:   Glucose-Capillary >600 (*)    All other components within normal limits  CBG MONITORING, ED - Abnormal; Notable for the  following components:   Glucose-Capillary >600 (*)    All other components within normal limits  CBG MONITORING, ED - Abnormal; Notable for the following components:   Glucose-Capillary >600 (*)    All other  components within normal limits  BASIC METABOLIC PANEL  BASIC METABOLIC PANEL  BASIC METABOLIC PANEL  BASIC METABOLIC PANEL  BETA-HYDROXYBUTYRIC ACID  BETA-HYDROXYBUTYRIC ACID  HEPATIC FUNCTION PANEL  LIPASE, BLOOD      EKG   EKG Interpretation  Date/Time:    Ventricular Rate:    PR Interval:    QRS Duration:   QT Interval:    QTC Calculation:   R Axis:     Text Interpretation:             Medicines ordered and prescription drug management: Meds ordered this encounter  Medications   lactated ringers bolus 1,388 mL   insulin regular, human (MYXREDLIN) 100 units/ 100 mL infusion    Order Specific Question:   EndoTool low target:    Answer:   140    Order Specific Question:   EndoTool high target:    Answer:   180    Order Specific Question:   Type of Diabetes    Answer:   Unknown    Order Specific Question:   Mode of Therapy    Answer:   ENDOX1 for DKA    Order Specific Question:   Start Method    Answer:   EndoTool to calculate   lactated ringers infusion   dextrose 5 % in lactated ringers infusion   dextrose 50 % solution 0-50 mL   potassium chloride 10 mEq in 100 mL IVPB    -I have reviewed the patients home medicines and have made adjustments as needed   Consultations Obtained: I requested consultation with the hospitalist,  and discussed lab and imaging findings as well as pertinent plan - they recommend: admission   Cardiac Monitoring: The patient was maintained on a cardiac monitor.  I personally viewed and interpreted the cardiac monitored which showed an underlying rhythm of: sinus tachycardia  Social Determinants of Health:  Factors impacting patients care include: alcohol use   Reevaluation: After the interventions noted above, I  reevaluated the patient and found that they have improved  Co morbidities that complicate the patient evaluation  Past Medical History:  Diagnosis Date   Depression    Diabetes mellitus without complication ([place])    HIV infection ([place])    Tobacco use       Dispostion: Admit    Final Clinical Impression(s) / ED Diagnoses Final diagnoses:  Diabetic ketoacidosis without coma associated with other specified diabetes mellitus ([place])     This chart was dictated using voice recognition software.  Despite best efforts to proofread,  errors can occur which can change the documentation meaning.    Cristie Hem, MD 02/26/22 1840

## 2022-02-26 NOTE — H&P (Signed)
History and Physical   Ronald Castaneda YFR:[id number] DOB: 1955/11/17 DOA: 02/26/2022  PCP: Hoyt Koch, MD   Patient coming from:  PCP office  Chief Complaint: Hyperglycemia  HPI: Ronald Castaneda is a 66 y.o. male with medical history significant of HIV, depression, COPD, hyperlipidemia, hypertension, chronic pain presenting with hyperglycemia.  Patient has been experiencing 1 week of generalized weakness.  He reports some associated polyuria and polydipsia as well as a chronic mild shortness of breath.  He went to his PCP for evaluation today and was found to have significant elevated glucose and sent to the ED for further evaluation due to concern for possible hyperglycemic crisis.  Denies any previous history of diabetes.  States his son has diabetes and had a similar sudden presentation.  Denies fevers, chills, chest pain, shortness of breath, constipation, diarrhea, nausea, vomiting.  ED Course: Signs in the ED significant for heart rate in the 100s to 110s, blood pressure in the 670L to 410V systolic.  Lab work-up included BMP with sodium 128 which corrects considering glucose of 739, chloride 88, creatinine elevated 2.02 from baseline of 1.  CMP was done outpatient and showed alk phos 166 and protein 9.3.  CBC outpatient showed leukocytosis to 10.6 and CBC here showed leukocytosis to 12.2.  TSH and B12 normal outpatient.  Vitamin D level low outpatient.  Lipase pending.  A1c outpatient 11.8.  Beta hydroxybutyric acid elevated to 3.73.  Patient received insulin, IV fluids, 20 equivalents of p.o. potassium in the ED.  Review of Systems: As per HPI otherwise all other systems reviewed and are negative.  Past Medical History:  Diagnosis Date   Depression    Diabetes mellitus without complication ([place])    HIV infection ([place])    Tobacco use     Past Surgical History:  Procedure Laterality Date   None      Social History  reports that he has quit smoking. His smoking use  included cigarettes. He has a 23.00 pack-year smoking history. He has never used smokeless tobacco. He reports current alcohol use of about 6.0 standard drinks of alcohol per week. He reports that he does not use drugs.  No Known Allergies  Family History  Problem Relation Age of Onset   Hypertension Mother    Healthy Sister    Healthy Brother    Diabetes type I Daughter   Reviewed on admission  Prior to Admission medications   Medication Sig Start Date End Date Taking? Authorizing Provider  albuterol (VENTOLIN HFA) 108 (90 Base) MCG/ACT inhaler Inhale 2 puffs into the lungs every 6 (six) hours as needed for wheezing or shortness of breath. 02/08/22   Hoyt Koch, MD  amLODipine (NORVASC) 10 MG tablet Take 1 tablet (10 mg total) by mouth daily. 01/22/22   Hoyt Koch, MD  aspirin 81 MG chewable tablet Chew 1 tablet (81 mg total) by mouth daily. 04/22/21   Thurnell Lose, MD  carvedilol (COREG) 3.125 MG tablet Take 1 tablet (3.125 mg total) by mouth 2 (two) times daily with a meal. 04/23/21   Hoyt Koch, MD  cyclobenzaprine (FLEXERIL) 5 MG tablet Take 1 tablet (5 mg total) by mouth 3 (three) times daily as needed for muscle spasms. 01/22/22   Hoyt Koch, MD  Darunavir-Cobicistat-Emtricitabine-Tenofovir Alafenamide (SYMTUZA) 800-150-200-10 MG TABS TAKE 1 TABLET BY MOUTH DAILY WITH BREAKFAST. 10/14/21 10/14/22  Michel Bickers, MD  dolutegravir (TIVICAY) 50 MG tablet TAKE 1 TABLET (50 MG TOTAL) BY MOUTH DAILY. 10/14/21  10/14/22  Michel Bickers, MD  meloxicam (MOBIC) 15 MG tablet Take 1 tablet (15 mg total) by mouth daily. 01/22/22   Hoyt Koch, MD  oxymetazoline (AFRIN) 0.05 % nasal spray Place 1 spray into both nostrils 2 (two) times daily as needed for congestion.    [provider]  rosuvastatin (CRESTOR) 10 MG tablet Take 1 tablet (10 mg total) by mouth daily. 01/07/22   Hoyt Koch, MD  sildenafil (VIAGRA) 100 MG tablet Take 1  tablet (100 mg total) by mouth daily as needed for erectile dysfunction. 02/08/22   Hoyt Koch, MD    Physical Exam: Vitals:   02/26/22 1800 02/26/22 1807 02/26/22 1830 02/26/22 1845  BP: (!) 136/93  127/75 (!) 143/97  Pulse: (!) 105  95 92  Resp: (!) $RemoveB'21  12 16  'kiDzZlVv$ Temp:      TempSrc:      SpO2: 95% 95% 94% 90%  Weight:      Height:        Physical Exam Constitutional:      General: He is not in acute distress.    Appearance: Normal appearance.  HENT:     Head: Normocephalic and atraumatic.     Mouth/Throat:     Mouth: Mucous membranes are moist.     Pharynx: Oropharynx is clear.  Eyes:     Extraocular Movements: Extraocular movements intact.     Pupils: Pupils are equal, round, and reactive to light.  Cardiovascular:     Rate and Rhythm: Regular rhythm.     Pulses: Normal pulses.     Heart sounds: Normal heart sounds.  Pulmonary:     Effort: Pulmonary effort is normal. No respiratory distress.     Breath sounds: Normal breath sounds.  Abdominal:     General: Bowel sounds are normal. There is no distension.     Palpations: Abdomen is soft.     Tenderness: There is no abdominal tenderness.  Musculoskeletal:        General: No swelling or deformity.  Skin:    General: Skin is warm and dry.  Neurological:     General: No focal deficit present.     Mental Status: Mental status is at baseline.    Labs on Admission: I have personally reviewed following labs and imaging studies  CBC: Recent Labs  Lab 02/26/22 0938 02/26/22 1416  WBC 10.6* 12.2*  HGB 16.1 17.0  HCT 47.5 46.9  MCV 81.4 76.6*  PLT 319.0 494    Basic Metabolic Panel: Recent Labs  Lab 02/26/22 0938 02/26/22 1416 02/26/22 1757  NA 126* 128* 126*  K 4.2 4.9 4.6  CL 86* 88* 88*  CO2 22 21* 18*  GLUCOSE 744* 739* 881*  BUN 23 23 28*  CREATININE 1.91* 2.02* 2.24*  CALCIUM 9.5 9.3 9.2    GFR: Estimated Creatinine Clearance: 31.8 mL/min (A) (by C-G formula based on SCr of 2.24 mg/dL  (H)).  Liver Function Tests: Recent Labs  Lab 02/26/22 0938 02/26/22 1757  AST 18 27  ALT 42 47*  ALKPHOS 166* 150*  BILITOT 0.6 1.0  PROT 9.3* 9.2*  ALBUMIN 4.2 3.9    Urine analysis:    Component Value Date/Time   COLORURINE STRAW (A) 02/26/2022 1407   APPEARANCEUR CLEAR 02/26/2022 1407   LABSPEC 1.023 02/26/2022 1407   PHURINE 5.0 02/26/2022 1407   GLUCOSEU >=500 (A) 02/26/2022 1407   HGBUR SMALL (A) 02/26/2022 1407   BILIRUBINUR NEGATIVE 02/26/2022 1407   KETONESUR 5 (  A) 02/26/2022 1407   PROTEINUR NEGATIVE 02/26/2022 1407   UROBILINOGEN 0.2 07/18/2016 1608   NITRITE NEGATIVE 02/26/2022 1407   LEUKOCYTESUR NEGATIVE 02/26/2022 1407    Radiological Exams on Admission: No results found.  EKG: Not performed in the emergency department  Assessment/Plan Principal Problem:   DKA (diabetic ketoacidosis) ([place]) Active Problems:   HIV disease ([place])   Depression   COPD (chronic obstructive pulmonary disease) ([facility])   Hypercholesteremia   Benign hypertension   AKI (acute kidney injury) ([place])   DKA > Patient with distant history of diabetes now presenting from PCP with elevated glucose in setting of 1 week of generalized weakness with associated polyuria and polydipsia. > Noted to have glucose of 739 in the ED.  With bicarb of 21 and gap of 19. > A1c already resulted at 11.8.  Beta hydroxybutyric acid 3.73. > Patient started on insulin drip and IV fluids in the ED also received 20 equivalents of IV potassium. - Admit to progressive - Continue on insulin drip - LR at 125 mL/hr until CBG less than 250 - Switch to D5-LR when 1 CBG less than 250 - Nothing by mouth  - BMET every 4 hours - CBG Q1H - Once anion gap closed 2, start CM diet and if able to eat, administer Lantus ~15U - Continue insulin drip for 1-2 more hours, then discontinue and start SSI-S  - DC fluids if eating, drinking, and off insulin drip  for  AKI > In the setting of DKA as above.  Creatinine of  2.02 from baseline around 1. > Received significant IV fluids in the ED remains on IV fluids for treatment of DKA as above. - Continue IV fluids - Trend renal function electrolytes   HIV - Continue home Symtuza and T[place]ay  COPD - Continue home Albuterol  Hyperlipidemia - Continue home rosuvastatin  Hypertension - Holding home amlodipine and carvedilol in setting of low normal blood pressure and AKI  DVT prophylaxis: Lovenox Code Status:   Full Family Communication:  None on admission.  Wife was here previously and is aware of admission. Disposition Plan:   Patient is from:  Home  Anticipated DC to:  Home  Anticipated DC date:  1 to 3 days  Anticipated DC barriers: None  Consults called:  None Admission status:  Observation, progressive  Severity of Illness: The appropriate patient status for this patient is OBSERVATION. Observation status is judged to be reasonable and necessary in order to provide the required intensity of service to ensure the patient's safety. The patient's presenting symptoms, physical exam findings, and initial radiographic and laboratory data in the context of their medical condition is felt to place them at decreased risk for further clinical deterioration. Furthermore, it is anticipated that the patient will be medically stable for discharge from the hospital within 2 midnights of admission.    Marcelyn Bruins MD [facility]  How to contact the [facility] Attending or Consulting provider [place] or covering provider during after hours [place], for this patient?   Check the care team in [facility] and look for a) attending/consulting [facility] provider listed and b) the [facility] team listed Log into [email or web address] and use [place]'s universal password to access. If you do not have the password, please contact the hospital operator. Locate the [facility] provider you are looking for under [facility] and page to a number that you can be directly reached. If you still  have difficulty reaching the provider, please page the  DOC (Director on Call) for the Hospitalists listed on amion for assistance.  02/26/2022, 8:27 PM

## 2022-02-26 NOTE — ED Triage Notes (Signed)
Sent by PCP due to CBG 744  Patient having excessive thirst and weakness.

## 2022-02-26 NOTE — Telephone Encounter (Signed)
Ronald Castaneda (lab) called --stated Critical lab--Glucose 744.

## 2022-02-26 NOTE — Patient Instructions (Signed)
We will check the labs today. 

## 2022-02-27 DIAGNOSIS — D72829 Elevated white blood cell count, unspecified: Secondary | ICD-10-CM | POA: Diagnosis present

## 2022-02-27 DIAGNOSIS — F32A Depression, unspecified: Secondary | ICD-10-CM | POA: Diagnosis present

## 2022-02-27 DIAGNOSIS — Z833 Family history of diabetes mellitus: Secondary | ICD-10-CM | POA: Diagnosis not present

## 2022-02-27 DIAGNOSIS — I10 Essential (primary) hypertension: Secondary | ICD-10-CM | POA: Diagnosis present

## 2022-02-27 DIAGNOSIS — N179 Acute kidney failure, unspecified: Secondary | ICD-10-CM | POA: Diagnosis present

## 2022-02-27 DIAGNOSIS — E1310 Other specified diabetes mellitus with ketoacidosis without coma: Secondary | ICD-10-CM | POA: Diagnosis not present

## 2022-02-27 DIAGNOSIS — Z79899 Other long term (current) drug therapy: Secondary | ICD-10-CM | POA: Diagnosis not present

## 2022-02-27 DIAGNOSIS — R631 Polydipsia: Secondary | ICD-10-CM | POA: Insufficient documentation

## 2022-02-27 DIAGNOSIS — E7800 Pure hypercholesterolemia, unspecified: Secondary | ICD-10-CM | POA: Diagnosis present

## 2022-02-27 DIAGNOSIS — Z8249 Family history of ischemic heart disease and other diseases of the circulatory system: Secondary | ICD-10-CM | POA: Diagnosis not present

## 2022-02-27 DIAGNOSIS — Z87891 Personal history of nicotine dependence: Secondary | ICD-10-CM | POA: Diagnosis not present

## 2022-02-27 DIAGNOSIS — J449 Chronic obstructive pulmonary disease, unspecified: Secondary | ICD-10-CM | POA: Diagnosis present

## 2022-02-27 DIAGNOSIS — Z7982 Long term (current) use of aspirin: Secondary | ICD-10-CM | POA: Diagnosis not present

## 2022-02-27 DIAGNOSIS — B20 Human immunodeficiency virus [HIV] disease: Secondary | ICD-10-CM | POA: Diagnosis present

## 2022-02-27 DIAGNOSIS — E876 Hypokalemia: Secondary | ICD-10-CM | POA: Diagnosis not present

## 2022-02-27 DIAGNOSIS — E1110 Type 2 diabetes mellitus with ketoacidosis without coma: Secondary | ICD-10-CM | POA: Diagnosis present

## 2022-02-27 LAB — BASIC METABOLIC PANEL
Anion gap: 13 (ref 5–15)
Anion gap: 11 (ref 5–15)
BUN: 14 mg/dL (ref 8–23)
BUN: 14 mg/dL (ref 8–23)
CO2: 23 mmol/L (ref 22–32)
CO2: 25 mmol/L (ref 22–32)
Calcium: 8.6 mg/dL — ABNORMAL LOW (ref 8.9–10.3)
Calcium: 8.5 mg/dL — ABNORMAL LOW (ref 8.9–10.3)
Chloride: 100 mmol/L (ref 98–111)
Chloride: 99 mmol/L (ref 98–111)
Creatinine, Ser: 1.19 mg/dL (ref 0.61–1.24)
Creatinine, Ser: 1.17 mg/dL (ref 0.61–1.24)
GFR, Estimated: >60 mL/min (ref >60)
GFR, Estimated: >60 mL/min (ref >60)
Glucose, Bld: 137 mg/dL — ABNORMAL HIGH (ref 70–99)
Glucose, Bld: 173 mg/dL — ABNORMAL HIGH (ref 70–99)
Potassium: 3.3 mmol/L — ABNORMAL LOW (ref 3.5–5.1)
Potassium: 3.2 mmol/L — ABNORMAL LOW (ref 3.5–5.1)
Sodium: 136 mmol/L (ref 135–145)
Sodium: 135 mmol/L (ref 135–145)

## 2022-02-27 LAB — HEPATIC FUNCTION PANEL
ALT: 36 U/L (ref 0–44)
AST: 23 U/L (ref 15–41)
Albumin: 3 g/dL — ABNORMAL LOW (ref 3.5–5.0)
Alkaline Phosphatase: 107 U/L (ref 38–126)
Bilirubin, Direct: 0.1 mg/dL (ref 0.0–0.2)
Indirect Bilirubin: 0.5 mg/dL (ref 0.3–0.9)
Total Bilirubin: 0.6 mg/dL (ref 0.3–1.2)
Total Protein: 7.3 g/dL (ref 6.5–8.1)

## 2022-02-27 LAB — BETA-HYDROXYBUTYRIC ACID: Beta-Hydroxybutyric Acid: 0.65 mmol/L — ABNORMAL HIGH (ref 0.05–0.27)

## 2022-02-27 LAB — GLUCOSE, CAPILLARY
Glucose-Capillary: 206 mg/dL — ABNORMAL HIGH (ref 70–99)
Glucose-Capillary: 164 mg/dL — ABNORMAL HIGH (ref 70–99)
Glucose-Capillary: 147 mg/dL — ABNORMAL HIGH (ref 70–99)
Glucose-Capillary: 162 mg/dL — ABNORMAL HIGH (ref 70–99)
Glucose-Capillary: 151 mg/dL — ABNORMAL HIGH (ref 70–99)
Glucose-Capillary: 152 mg/dL — ABNORMAL HIGH (ref 70–99)
Glucose-Capillary: 168 mg/dL — ABNORMAL HIGH (ref 70–99)
Glucose-Capillary: 148 mg/dL — ABNORMAL HIGH (ref 70–99)
Glucose-Capillary: 205 mg/dL — ABNORMAL HIGH (ref 70–99)
Glucose-Capillary: 223 mg/dL — ABNORMAL HIGH (ref 70–99)
Glucose-Capillary: 267 mg/dL — ABNORMAL HIGH (ref 70–99)
Glucose-Capillary: 315 mg/dL — ABNORMAL HIGH (ref 70–99)
Glucose-Capillary: 296 mg/dL — ABNORMAL HIGH (ref 70–99)
Glucose-Capillary: 73 mg/dL (ref 70–99)

## 2022-02-27 LAB — HEMOGLOBIN A1C
Hgb A1c MFr Bld: 11.8 % of total Hgb — ABNORMAL HIGH (ref <5.7)
Mean Plasma Glucose: 292 mg/dL
eAG (mmol/L): 16.2 mmol/L

## 2022-02-27 MED ORDER — POTASSIUM CHLORIDE CRYS ER 20 MEQ PO TBCR
40.0000 meq | EXTENDED_RELEASE_TABLET | Freq: Once | ORAL | Status: AC
  Administered 2022-02-27: 40 meq via ORAL
  Filled 2022-02-27: qty 2

## 2022-02-27 MED ORDER — INSULIN ASPART 100 UNIT/ML IJ SOLN
3.0000 [IU] | Freq: Three times a day (TID) | INTRAMUSCULAR | Status: DC
  Administered 2022-02-27 – 2022-03-01 (×8): 3 [IU] via SUBCUTANEOUS

## 2022-02-27 MED ORDER — INSULIN ASPART 100 UNIT/ML IJ SOLN: 0.0000 [IU] | Freq: Every day | INTRAMUSCULAR | Status: DC

## 2022-02-27 MED ORDER — INSULIN ASPART 100 UNIT/ML IJ SOLN
0.0000 [IU] | Freq: Three times a day (TID) | INTRAMUSCULAR | Status: DC
  Administered 2022-02-27: 7 [IU] via SUBCUTANEOUS
  Administered 2022-02-27: 5 [IU] via SUBCUTANEOUS
  Administered 2022-02-28: 7 [IU] via SUBCUTANEOUS
  Administered 2022-02-28: 1 [IU] via SUBCUTANEOUS
  Administered 2022-02-28: 5 [IU] via SUBCUTANEOUS
  Administered 2022-03-01: 1 [IU] via SUBCUTANEOUS
  Administered 2022-03-01: 3 [IU] via SUBCUTANEOUS
  Administered 2022-03-01: 1 [IU] via SUBCUTANEOUS

## 2022-02-27 MED ORDER — INSULIN GLARGINE-YFGN 100 UNIT/ML ~~LOC~~ SOLN
12.0000 [IU] | SUBCUTANEOUS | Status: DC
  Administered 2022-02-27 – 2022-02-28 (×2): 12 [IU] via SUBCUTANEOUS
  Filled 2022-02-27 (×2): qty 0.12

## 2022-02-27 MED ORDER — INSULIN STARTER KIT- PEN NEEDLES (ENGLISH)
1.0000 | Freq: Once | Status: AC
  Administered 2022-02-27: 1
  Filled 2022-02-27: qty 1

## 2022-02-27 MED ORDER — LIVING WELL WITH DIABETES BOOK
Freq: Once | Status: AC
  Filled 2022-02-27: qty 1

## 2022-02-27 NOTE — Progress Notes (Signed)
Nutrition Brief Note  RD consulted for nutrition education regarding new diabetes. RD working remotely at time of consult.  Lab Results  Component Value Date   HGBA1C 11.8 (H) 02/26/2022    RD provided "Carbohydrate Counting for People with Diabetes and Plate Method for Diabetes" handout from the Academy of Nutrition and Dietetics to pt discharge instructions.   Current diet order is Carb Modified, no meal completions have been documented. Labs and medications reviewed.   No further nutrition interventions warranted at this time. If additional nutrition issues arise, please re-consult RD.  Hermina Barters RD, LDN Clinical Dietitian See Shea Evans for contact information.

## 2022-02-27 NOTE — Discharge Instructions (Addendum)
When you go to the pharmacy to get medications for diabetes, please get some glucose tablets (over the counter) to use to treat hypoglycemia (low glucose; if less than 70 mg/dl).   Carbohydrate Counting For People With Diabetes  Foods with carbohydrates make your blood glucose level go up. Learning how to count carbohydrates can help you control your blood glucose levels. First, identify the foods you eat that contain carbohydrates. Then, using the Foods with Carbohydrates chart, determine about how much carbohydrates are in your meals and snacks. Make sure you are eating foods with fiber, protein, and healthy fat along with your carbohydrate foods. Foods with Carbohydrates The following table shows carbohydrate foods that have about 15 grams of carbohydrate each. Using measuring cups, spoons, or a food scale when you first begin learning about carbohydrate counting can help you learn about the portion sizes you typically eat. The following foods have 15 grams carbohydrate each:  Grains 1 slice bread (1 ounce)  1 small tortilla (6-inch size)   large bagel (1 ounce)  1/3 cup pasta or rice (cooked)   hamburger or hot dog bun ( ounce)   cup cooked cereal   to  cup ready-to-eat cereal  2 taco shells (5-inch size) Fruit 1 small fresh fruit ( to 1 cup)   medium banana  17 small grapes (3 ounces)  1 cup melon or berries   cup canned or frozen fruit  2 tablespoons dried fruit (blueberries, cherries, cranberries, raisins)   cup unsweetened fruit juice  Starchy Vegetables  cup cooked beans, peas, corn, potatoes/sweet potatoes   large baked potato (3 ounces)  1 cup acorn or butternut squash  Snack Foods 3 to 6 crackers  8 potato chips or 13 tortilla chips ( ounce to 1 ounce)  3 cups popped popcorn  Dairy 3/4 cup (6 ounces) nonfat plain yogurt, or yogurt with sugar-free sweetener  1 cup milk  1 cup plain rice, soy, coconut or flavored almond milk Sweets and Desserts  cup ice  cream or frozen yogurt  1 tablespoon jam, jelly, pancake syrup, table sugar, or honey  2 tablespoons light pancake syrup  1 inch square of frosted cake or 2 inch square of unfrosted cake  2 small cookies (2/3 ounce each) or  large cookie  Sometimes you'll have to estimate carbohydrate amounts if you don't know the exact recipe. One cup of mixed foods like soups can have 1 to 2 carbohydrate servings, while some casseroles might have 2 or more servings of carbohydrate. Foods that have less than 20 calories in each serving can be counted as "free" foods. Count 1 cup raw vegetables, or  cup cooked non-starchy vegetables as "free" foods. If you eat 3 or more servings at one meal, then count them as 1 carbohydrate serving.  Foods without Carbohydrates  Not all foods contain carbohydrates. Meat, some dairy, fats, non-starchy vegetables, and many beverages don't contain carbohydrate. So when you count carbohydrates, you can generally exclude chicken, pork, beef, fish, seafood, eggs, tofu, cheese, butter, sour cream, avocado, nuts, seeds, olives, mayonnaise, water, black coffee, unsweetened tea, and zero-calorie drinks. Vegetables with no or low carbohydrate include green beans, cauliflower, tomatoes, and onions. How much carbohydrate should I eat at each meal?  Carbohydrate counting can help you plan your meals and manage your weight. Following are some starting points for carbohydrate intake at each meal. Work with your registered dietitian nutritionist to find the best range that works for your blood glucose and weight.  To Lose Weight To Maintain Weight  Women 2 - 3 carb servings 3 - 4 carb servings  Men 3 - 4 carb servings 4 - 5 carb servings  Checking your blood glucose after meals will help you know if you need to adjust the timing, type, or number of carbohydrate servings in your meal plan. Achieve and keep a healthy body weight by balancing your food intake and physical activity.  Tips How should  I plan my meals?  Plan for half the food on your plate to include non-starchy vegetables, like salad greens, broccoli, or carrots. Try to eat 3 to 5 servings of non-starchy vegetables every day. Have a protein food at each meal. Protein foods include chicken, fish, meat, eggs, or beans (note that beans contain carbohydrate). These two food groups (non-starchy vegetables and proteins) are low in carbohydrate. If you fill up your plate with these foods, you will eat less carbohydrate but still fill up your stomach. Try to limit your carbohydrate portion to  of the plate.  What fats are healthiest to eat?  Diabetes increases risk for heart disease. To help protect your heart, eat more healthy fats, such as olive oil, nuts, and avocado. Eat less saturated fats like butter, cream, and high-fat meats, like bacon and sausage. Avoid trans fats, which are in all foods that list "partially hydrogenated oil" as an ingredient. What should I drink?  Choose drinks that are not sweetened with sugar. The healthiest choices are water, carbonated or seltzer waters, and tea and coffee without added sugars.  Sweet drinks will make your blood glucose go up very quickly. One serving of soda or energy drink is  cup. It is best to drink these beverages only if your blood glucose is low.  Artificially sweetened, or diet drinks, typically do not increase your blood glucose if they have zero calories in them. Read labels of beverages, as some diet drinks do have carbohydrate and will raise your blood glucose. Label Reading Tips Read Nutrition Facts labels to find out how many grams of carbohydrate are in a food you want to eat. Don't forget: sometimes serving sizes on the label aren't the same as how much food you are going to eat, so you may need to calculate how much carbohydrate is in the food you are serving yourself.   Carbohydrate Counting for People with Diabetes Sample 1-Day Menu  Breakfast  cup yogurt, low fat, low  sugar (1 carbohydrate serving)   cup cereal, ready-to-eat, unsweetened (1 carbohydrate serving)  1 cup strawberries (1 carbohydrate serving)   cup almonds ( carbohydrate serving)  Lunch 1, 5 ounce can chunk light tuna  2 ounces cheese, low fat cheddar  6 whole wheat crackers (1 carbohydrate serving)  1 small apple (1 carbohydrate servings)   cup carrots ( carbohydrate serving)   cup snap peas  1 cup 1% milk (1 carbohydrate serving)   Evening Meal Stir fry made with: 3 ounces chicken  1 cup brown rice (3 carbohydrate servings)   cup broccoli ( carbohydrate serving)   cup green beans   cup onions  1 tablespoon olive oil  2 tablespoons teriyaki sauce ( carbohydrate serving)  Evening Snack 1 extra small banana (1 carbohydrate serving)  1 tablespoon peanut butter   Carbohydrate Counting for People with Diabetes Vegan Sample 1-Day Menu  Breakfast 1 cup cooked oatmeal (2 carbohydrate servings)   cup blueberries (1 carbohydrate serving)  2 tablespoons flaxseeds  1 cup soymilk fortified with calcium and  vitamin D  1 cup coffee  Lunch 2 slices whole wheat bread (2 carbohydrate servings)   cup baked tofu   cup lettuce  2 slices tomato  2 slices avocado   cup baby carrots ( carbohydrate serving)  1 orange (1 carbohydrate serving)  1 cup soymilk fortified with calcium and vitamin D   Evening Meal Burrito made with: 1 6-inch corn tortilla (1 carbohydrate serving)  1 cup refried vegetarian beans (2 carbohydrate servings)   cup chopped tomatoes   cup lettuce   cup salsa  1/3 cup brown rice (1 carbohydrate serving)  1 tablespoon olive oil for rice   cup zucchini   Evening Snack 6 small whole grain crackers (1 carbohydrate serving)  2 apricots ( carbohydrate serving)   cup unsalted peanuts ( carbohydrate serving)    Carbohydrate Counting for People with Diabetes Vegetarian (Lacto-Ovo) Sample 1-Day Menu  Breakfast 1 cup cooked oatmeal (2 carbohydrate servings)    cup blueberries (1 carbohydrate serving)  2 tablespoons flaxseeds  1 egg  1 cup 1% milk (1 carbohydrate serving)  1 cup coffee  Lunch 2 slices whole wheat bread (2 carbohydrate servings)  2 ounces low-fat cheese   cup lettuce  2 slices tomato  2 slices avocado   cup baby carrots ( carbohydrate serving)  1 orange (1 carbohydrate serving)  1 cup unsweetened tea  Evening Meal Burrito made with: 1 6-inch corn tortilla (1 carbohydrate serving)   cup refried vegetarian beans (1 carbohydrate serving)   cup tomatoes   cup lettuce   cup salsa  1/3 cup brown rice (1 carbohydrate serving)  1 tablespoon olive oil for rice   cup zucchini  1 cup 1% milk (1 carbohydrate serving)  Evening Snack 6 small whole grain crackers (1 carbohydrate serving)  2 apricots ( carbohydrate serving)   cup unsalted peanuts ( carbohydrate serving)    Copyright 2020  Academy of Nutrition and Dietetics. All rights reserved.  Using Nutrition Labels: Carbohydrate  Serving Size  Look at the serving size. All the information on the label is based on this portion. Servings Per Container  The number of servings contained in the package. Guidelines for Carbohydrate  Look at the total grams of carbohydrate in the serving size.  1 carbohydrate choice = 15 grams of carbohydrate. Range of Carbohydrate Grams Per Choice  Carbohydrate Grams/Choice Carbohydrate Choices  6-10   11-20 1  21-25 1  26-35 2  36-40 2  41-50 3  51-55 3  56-65 4  66-70 4  71-80 5    Copyright 2020  Academy of Nutrition and Dietetics. All rights reserved. Plate Method for Diabetes   Foods with carbohydrates make your blood glucose level go up. The plate method is a simple way to meal plan and control the amount of carbohydrate you eat.         Use the following guidance to build a healthy plate to control carbohydrates. Divide a 9-inch plate into 3 sections, and consider your beverage the 4th section of your  meal: Food Group Examples of Foods/Beverages for This Section of your Meal  Section 1: Non-starchy vegetables Fill  of your plate to include non-starchy vegetables Asparagus, broccoli, brussels sprouts, cabbage, carrots, cauliflower, celery, cucumber, green beans, mushrooms, peppers, salad greens, tomatoes, or zucchini.  Section 2: Protein foods Fill  of your plate to include a lean protein Lean meat, poultry, fish, seafood, cheese, eggs, lean deli meat, tofu, beans, lentils, nuts or nut butters.  Section 3:  Carbohydrate foods Fill  of your plate to include carbohydrate foods Whole grains, whole wheat bread, brown rice, whole grain pasta, polenta, corn tortillas, fruit, or starchy vegetables (potatoes, green peas, corn, beans, acorn squash, and butternut squash). One cup of milk also counts as a food that contains carbohydrate.  Section 4: Beverage Choose water or a low-calorie drink for your beverage. Unsweetened tea, coffee, or flavored/sparkling water without added sugar.  Image reprinted with permission from The American Diabetes Association.  Copyright 2022 by the American Diabetes Association.   Copyright 2022  Academy of Nutrition and Dietetics. All rights reserved   Please watch YouTube video on [employer] so you can see how to apply sensor. Just a reminder that your password on the [employer] app was: [email or web address]

## 2022-02-27 NOTE — Progress Notes (Signed)
Mobility Specialist - Progress Note   02/27/22 1450  Mobility  Activity Refused mobility   Pt was sleep upon arrival and hard to arouse. Will follow up if time permits.   Larey Seat

## 2022-02-27 NOTE — Progress Notes (Signed)
PROGRESS NOTE    Ronald Castaneda  SPQ:[id number] DOB: 04/14/1956 DOA: 02/26/2022 PCP: Hoyt Koch, MD  Chief Complaint  Patient presents with   Hyperglycemia    Brief Narrative:  Ronald Castaneda is Ronald Castaneda 66 y.o. male with medical history significant of HIV, depression, COPD, hyperlipidemia, hypertension, chronic pain presenting with hyperglycemia.   Patient has been experiencing 1 week of generalized weakness.  He reports some associated polyuria and polydipsia as well as Valoria Tamburri chronic mild shortness of breath.  He went to his PCP for evaluation today and was found to have significant elevated glucose and sent to the ED for further evaluation due to concern for possible hyperglycemic crisis.   Denies any previous history of diabetes.  States his son has diabetes and had Zevin Nevares similar sudden presentation.   Denies fevers, chills, chest pain, shortness of breath, constipation, diarrhea, nausea, vomiting.   ED Course: Signs in the ED significant for heart rate in the 100s to 110s, blood pressure in the 333L to 456Y systolic.  Lab work-up included BMP with sodium 128 which corrects considering glucose of 739, chloride 88, creatinine elevated 2.02 from baseline of 1.  CMP was done outpatient and showed alk phos 166 and protein 9.3.  CBC outpatient showed leukocytosis to 10.6 and CBC here showed leukocytosis to 12.2.  TSH and B12 normal outpatient.  Vitamin D level low outpatient.  Lipase pending.  A1c outpatient 11.8.  Beta hydroxybutyric acid elevated to 3.73.  Patient received insulin, IV fluids, 20 equivalents of p.o. potassium in the ED.   Assessment & Plan:   Principal Problem:   DKA (diabetic ketoacidosis) ([place]) Active Problems:   HIV disease ([place])   Depression   COPD (chronic obstructive pulmonary disease) ([facility])   Hypercholesteremia   Benign hypertension   AKI (acute kidney injury) ([place])   Assessment and Plan: DKA > Patient with distant history of diabetes (not sure where this is  noted? In history, hx diabetes was denied? Prediabetes based on a1c in 2016 --- Will work ok clarifying) now presenting from PCP with elevated glucose in setting of 1 week of generalized weakness with associated polyuria and polydipsia. > Noted to have glucose of 739 in the ED.  With bicarb of 21 and gap of 19. > A1c already resulted at 11.8.  Beta hydroxybutyric acid 3.73. - improved on insulin gtt - basal/bolus regimen ordered - c peptide/GAD ab ordered, he'll need endocrine follow up outpatient  - diabetes coordinator for education - will need further outpatient w/u and/or discussion regarding risk factor for his development of severe diabetes/dka -> a1c 2016 notable for prediabetes/insulin resistance -> related to HIV meds?   AKI > In the setting of DKA as above.  Creatinine of 2.02 from baseline around 1. > Received significant IV fluids in the ED remains on IV fluids for treatment of DKA as above. - resolved with IVF    HIV - Continue home [place] - discuss with ID regarding hyperglycemia risk?   COPD - Continue home Albuterol   Hyperlipidemia - Continue home rosuvastatin   Hypertension - Holding home amlodipine and carvedilol in setting of low normal blood pressure and AKI - will continue to monitor     DVT prophylaxis: lovenox Code Status: full Family Communication: none at bedside Disposition:   Status is: Observation The patient will require care spanning > 2 midnights and should be moved to inpatient because: need for further inpatient management   Consultants:  none  Procedures:  none  Antimicrobials:  Anti-infectives (From admission, onward)    Start     Dose/Rate Route Frequency Ordered Stop   02/27/22 1000  dolutegravir (TIVICAY) tablet 50 mg        50 mg Oral Daily 02/26/22 1841     02/27/22 0800  Darunavir-Cobicistat-Emtricitabine-Tenofovir Alafenamide (SYMTUZA) 800-150-200-10 MG TABS 1 tablet        1 tablet Oral Daily with breakfast  02/26/22 1841         Subjective: No new complaints  Objective: Vitals:   02/27/22 0001 02/27/22 0419 02/27/22 0908 02/27/22 1504  BP: 115/86 118/82 111/83 108/82  Pulse:  87 86 94  Resp:  _0 Temp: 98.6 F (37 C) 98.4 F (36.9 C) 97.8 F (36.6 C) 98 F (36.7 C)  TempSrc: Oral Oral Oral Oral  SpO2: 95%  95%   Weight:      Height:        Intake/Output Summary (Last 24 hours) at 02/27/2022 1817 Last data filed at 02/27/2022 1000 Gross per 24 hour  Intake 2516.25 ml  Output 475 ml  Net 2041.25 ml   Filed Weights   02/26/22 1403 02/26/22 2106  Weight: 69.4 kg 67.2 kg    Examination:  General exam: Appears calm and comfortable  Respiratory system: unlabored Cardiovascular system: RRR Gastrointestinal system: Abdomen is nondistended, soft and nontender. Central nervous system: Alert and oriented. No focal neurological deficits. Extremities: no LEE   Data Reviewed: I have personally reviewed following labs and imaging studies  CBC: Recent Labs  Lab 02/26/22 0938 02/26/22 1416  WBC 10.6* 12.2*  HGB 16.1 17.0  HCT 47.5 46.9  MCV 81.4 76.6*  PLT 319.0 854    Basic Metabolic Panel: Recent Labs  Lab 02/26/22 1757 02/26/22 2134 02/26/22 2247 02/27/22 0634 02/27/22 0944  NA 126* 137 135 136 135  K 4.6 4.0 4.1 3.3* 3.2*  CL 88* 100 102 100 99  CO2 18* _1 GLUCOSE 881* 380* 324* 137* 173*  BUN 28* _2 CREATININE 2.24* 1.56* 1.44* 1.19 1.17  CALCIUM 9.2 9.3 8.9 8.6* 8.5*    GFR: Estimated Creatinine Clearance: 59 mL/min (by C-G formula based on SCr of 1.17 mg/dL).  Liver Function Tests: Recent Labs  Lab 02/26/22 0938 02/26/22 1757 02/27/22 0944  AST _3 ALT 42 47* 36  ALKPHOS 166* 150* 107  BILITOT 0.6 1.0 0.6  PROT 9.3* 9.2* 7.3  ALBUMIN 4.2 3.9 3.0*    CBG: Recent Labs  Lab 02/27/22 1038 02/27/22 1126 02/27/22 1349 02/27/22 1507 02/27/22 1624  GLUCAP 223* 205* 267* 315* 296*     No results found  for this or any previous visit (from the past 240 hour(s)).       Radiology Studies: No results found.      Scheduled Meds:  Darunavir-Cobicistat-Emtricitabine-Tenofovir Alafenamide  1 tablet Oral Q breakfast   dolutegravir  50 mg Oral Daily   enoxaparin (LOVENOX) injection  40 mg Subcutaneous Q24H   insulin aspart  0-5 Units Subcutaneous QHS   insulin aspart  0-9 Units Subcutaneous TID WC   insulin aspart  3 Units Subcutaneous TID WC   insulin glargine-yfgn  12 Units Subcutaneous Q24H   rosuvastatin  10 mg Oral Daily   Continuous Infusions:  dextrose 5% lactated ringers 125 mL/hr at 02/27/22 0812   insulin Stopped (02/27/22 1349)   lactated ringers Stopped (02/26/22 1743)   lactated ringers Stopped (02/27/22 0807)  LOS: 0 days    Time spent: over 30 min    Fayrene Helper, MD [facility]   To contact the attending provider between 7A-7P or the covering provider during after hours 7P-7A, please log into the web site [email or web address] and access using universal [place] password for that web site. If you do not have the password, please call the hospital operator.  02/27/2022, 6:17 PM

## 2022-02-27 NOTE — Inpatient Diabetes Management (Addendum)
Inpatient Diabetes Program Recommendations  AACE/ADA: New Consensus Statement on Inpatient Glycemic Control   Target Ranges:  Prepandial:   less than 140 mg/dL      Peak postprandial:   less than 180 mg/dL (1-2 hours)      Critically ill patients:  140 - 180 mg/dL    Latest Reference Range & Units 02/26/22 09:38 02/26/22 14:16 02/26/22 17:57  CO2 22 - 32 mmol/L 22 21 (L) 18 (L)  Glucose 70 - 99 mg/dL 744 (HH) 739 (HH) 881 (HH)  Anion gap 5 - 15   19 (H) 20 (H)    Latest Reference Range & Units 02/26/22 14:07 02/26/22 17:57 02/26/22 22:47  Beta-Hydroxybutyric Acid 0.05 - 0.27 mmol/L 3.73 (H) 3.35 (H) 0.44 (H)    Latest Reference Range & Units 02/20/15 11:23 02/26/22 11:43 02/26/22 17:57  Hemoglobin A1C 4.8 - 5.6 % 6.2 11.8 (H) 11.8 (H)    Latest Reference Range & Units 10/14/21 08:47  Glucose 65 - 99 mg/dL 95   Review of Glycemic Control  Diabetes history: NO Outpatient Diabetes medications: NA Current orders for Inpatient glycemic control: IV insulin; transitioning to Semglee 12 units Q24H, Novolog 3 units TID with meals, Novolog 0-9 units TID with meals, Novolog 0-5 units QHS  Inpatient Diabetes Program Recommendations:    HbgA1C:  A1C 11.8% on 02/26/22 indicating an average glucose of 292 mg/dl over the past 2-3 months.  Labs: Please consider ordering c-peptide and GAD antibodies to help determine type of DM.  NOTE: Noted consult for Diabetes Coordinator. Diabetes Coordinator is not on campus over the weekend but available by pager from 8am to 5pm for questions or concerns. Per chart review, patient has no prior DM hx. Was sent to the hospital by PCP due to lab results. Initial lab glucose at the hospital on 02/26/22 at 14:16 was 739 mg/dl, CO2 21, AG 19, Beta-Hydroxybutyric Acid 3.73 and patent was started on IV insulin. Patient now has SQ insulin orders to transition from IV to SQ insulin.  Noted in H&P that patient has child with Type 1 DM. Would recommend ordering C-peptide and  GAD antibodies to help determine type of DM patient has. Ordered: Living Well with DM book, insulin starter kit, RD consult, patient education by bedside RNs, and outpatient DM education (MD co-sign required).  Labs in chart on 10/14/21 note glucose to be 95 mg/dl at that time. Noted patient is taking Tivicay  (per literature can cause hyperglycemia after 3-6 months of use) and Symtuza (can cause hyperglycemia) outpatient for HIV treatment.  Have tried to call patient's room phone and cell phone number multiples times but no answer. Also reached out to RN to be sure patient has phone in his room.  Addendum 02/27/22_0 :25. Spoke with patient over the phone about new diabetes diagnosis. Patient confirms no prior DM or preDM hx. Patient reports that his son has DM; when asked which Type patient is not sure but notes he was initially taking insulin but it was changed to oral DM medications.  Therefore, anticipate his son has DM2.  Patient reports he has been having symptoms of hyperglycemia for past week.  Patient has been very thirsty and drinks lots of regular soda (Pepsi and Mt Dew).  Discussed that lab glucose on 10/14/21 was noted to be 95 mg/dl and prior A1C was 6.2% in 2016.   Discussed A1C results (11.8% on 02/26/22) and explained what an A1C is and informed patient that his current A1C indicates an average glucose of  292 mg/dl over the past 2-3 months. Discussed basic pathophysiology of DM Type 1 and 2, basic home care, importance of checking CBGs and maintaining good CBG control to prevent long-term and short-term complications. Reviewed glucose and A1C goals.  Reviewed signs and symptoms of hyperglycemia and hypoglycemia along with treatment for both. Discussed impact of nutrition, exercise, stress, sickness, and medications on diabetes control. 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 Encouraged patient to do some type of activity at least 30 mins per day.  Discussed carb  modified diet, portion control, 50-75 grams of carbs per meal, and encouraged patient to eliminate sugary beverages. Informed patient that RD has been consulted and outpatient DM education as well.  Informed patient that I had noted that he takes [place] and per literature, they both can cause hyperglycemia. Patient reports that he has been on both of those medication for over 6 months. Explained that he may want to talk with his provider that manages HIV to see if they feel he needs to change medications given new DM dx.  Informed patient that he would be receiving Living Well with diabetes booklet and encouraged patient to read through entire book once received.  Discussed that he is now ordered SQ insulin (Semglee and Novolog) since he is being transitioned off IV insulin. Explained that there is a possibility that he may be discharged on insulin and that if he has DM2, he can follow up with PCP and they may want to try him on oral DM medications.   Informed patient that RN will be asking him to self-administer insulin to ensure proper technique and ability to administer self insulin shots.   Patient verbalized understanding of information discussed and he states that he has no further questions at this time related to diabetes.  Patient stated his wife had a few questions and gave her the phone. Spoke with patient's wife and reviewed information discussed with patient and answered her questions.   RNs to provide ongoing basic DM education at bedside with this patient and engage patient to actively check blood glucose, educate on insulin administration, and allow patient to self administer insulin injections.    Thanks, Barnie Alderman, RN, MSN, [place] Diabetes Coordinator Inpatient Diabetes Program [phone] (Team Pager from 8am to [place])

## 2022-02-27 NOTE — Assessment & Plan Note (Signed)
Checking HgA1c, CMP, CBC, vitamin D, vitamin B12 and treat as appropriate. Addendum sugar and AG high sent to hospital with likely DKA.

## 2022-02-27 NOTE — Assessment & Plan Note (Signed)
Checking CBC and CMP and TSH. Treat as appropriate.

## 2022-02-28 DIAGNOSIS — E1310 Other specified diabetes mellitus with ketoacidosis without coma: Secondary | ICD-10-CM | POA: Diagnosis not present

## 2022-02-28 LAB — COMPREHENSIVE METABOLIC PANEL
ALT: 26 U/L (ref 0–44)
AST: 20 U/L (ref 15–41)
Albumin: 2.9 g/dL — ABNORMAL LOW (ref 3.5–5.0)
Alkaline Phosphatase: 95 U/L (ref 38–126)
Anion gap: 11 (ref 5–15)
BUN: 12 mg/dL (ref 8–23)
CO2: 22 mmol/L (ref 22–32)
Calcium: 8.6 mg/dL — ABNORMAL LOW (ref 8.9–10.3)
Chloride: 100 mmol/L (ref 98–111)
Creatinine, Ser: 1.17 mg/dL (ref 0.61–1.24)
GFR, Estimated: >60 mL/min (ref >60)
Glucose, Bld: 243 mg/dL — ABNORMAL HIGH (ref 70–99)
Potassium: 3.9 mmol/L (ref 3.5–5.1)
Sodium: 133 mmol/L — ABNORMAL LOW (ref 135–145)
Total Bilirubin: 0.4 mg/dL (ref 0.3–1.2)
Total Protein: 6.6 g/dL (ref 6.5–8.1)

## 2022-02-28 LAB — CBC WITH DIFFERENTIAL/PLATELET
Abs Immature Granulocytes: 0.03 10*3/uL (ref 0.00–0.07)
Basophils Absolute: 0.1 10*3/uL (ref 0.0–0.1)
Basophils Relative: 1 %
Eosinophils Absolute: 0.1 10*3/uL (ref 0.0–0.5)
Eosinophils Relative: 1 %
HCT: 38.4 % — ABNORMAL LOW (ref 39.0–52.0)
Hemoglobin: 13.6 g/dL (ref 13.0–17.0)
Immature Granulocytes: 0 %
Lymphocytes Relative: 27 %
Lymphs Abs: 2.8 10*3/uL (ref 0.7–4.0)
MCH: 27.3 pg (ref 26.0–34.0)
MCHC: 35.4 g/dL (ref 30.0–36.0)
MCV: 77.1 fL — ABNORMAL LOW (ref 80.0–100.0)
Monocytes Absolute: 0.9 10*3/uL (ref 0.1–1.0)
Monocytes Relative: 8 %
Neutro Abs: 6.4 10*3/uL (ref 1.7–7.7)
Neutrophils Relative %: 63 %
Platelets: 269 10*3/uL (ref 150–400)
RBC: 4.98 MIL/uL (ref 4.22–5.81)
RDW: 13.2 % (ref 11.5–15.5)
WBC: 10.2 10*3/uL (ref 4.0–10.5)
nRBC: 0 % (ref 0.0–0.2)

## 2022-02-28 LAB — GLUCOSE, CAPILLARY
Glucose-Capillary: 321 mg/dL — ABNORMAL HIGH (ref 70–99)
Glucose-Capillary: 297 mg/dL — ABNORMAL HIGH (ref 70–99)
Glucose-Capillary: 146 mg/dL — ABNORMAL HIGH (ref 70–99)
Glucose-Capillary: 192 mg/dL — ABNORMAL HIGH (ref 70–99)

## 2022-02-28 LAB — MAGNESIUM: Magnesium: 1.9 mg/dL (ref 1.7–2.4)

## 2022-02-28 LAB — PHOSPHORUS: Phosphorus: 2.1 mg/dL — ABNORMAL LOW (ref 2.5–4.6)

## 2022-02-28 MED ORDER — INSULIN ASPART 100 UNIT/ML FLEXPEN: 3.0000 [IU] | PEN_INJECTOR | Freq: Three times a day (TID) | SUBCUTANEOUS | 2 refills | Status: DC

## 2022-02-28 MED ORDER — BLOOD GLUCOSE MONITOR KIT: PACK | 0 refills | Status: DC

## 2022-02-28 MED ORDER — INSULIN GLARGINE-YFGN 100 UNIT/ML ~~LOC~~ SOLN
6.0000 [IU] | Freq: Once | SUBCUTANEOUS | Status: AC
  Administered 2022-02-28: 6 [IU] via SUBCUTANEOUS
  Filled 2022-02-28: qty 0.06

## 2022-02-28 MED ORDER — INSULIN GLARGINE 100 UNIT/ML SOLOSTAR PEN: 18.0000 [IU] | PEN_INJECTOR | Freq: Every day | SUBCUTANEOUS | 2 refills | Status: DC

## 2022-02-28 MED ORDER — FREESTYLE LIBRE 3 SENSOR MISC: 3 refills | Status: DC

## 2022-02-28 MED ORDER — INSULIN GLARGINE-YFGN 100 UNIT/ML ~~LOC~~ SOLN
18.0000 [IU] | SUBCUTANEOUS | Status: DC
  Administered 2022-03-01: 18 [IU] via SUBCUTANEOUS
  Filled 2022-02-28: qty 0.18

## 2022-02-28 NOTE — Progress Notes (Signed)
Physician Discharge Summary  Ronald Castaneda [id number] DOB: 1955-07-14 DOA: 02/26/2022  PCP: Hoyt Koch, MD  Admit date: 02/26/2022 Discharge date: 02/28/2022  Time spent: 40 minutes  Recommendations for Outpatient Follow-up:  Follow outpatient CBC/CMP  Ensure endocrine follow up Has pending c peptide and gad ab which are pending at the time of discharge Consider abdominal imaging (pancreatic imaging) outpatient given presentation of diabetes, will defer to PCP/endocrine outpatient   Discharge Diagnoses:  Principal Problem:   DKA (diabetic ketoacidosis) ([place]) Active Problems:   HIV disease ([place])   Depression   COPD (chronic obstructive pulmonary disease) ([place])   Hypercholesteremia   Benign hypertension   AKI (acute kidney injury) ([place])   Discharge Condition: stable  Diet recommendation: diabetic  Filed Weights   02/26/22 1403 02/26/22 2106  Weight: 69.4 kg 67.2 kg    History of present illness:  Ronald Castaneda is Ronald Castaneda 66 y.o. male with medical history significant of HIV, depression, COPD, hyperlipidemia, hypertension, chronic pain presenting with DKA.  He's improved with treatment with insulin.  Discharged with insulin prescription and education.  Referral to endocrine outpatient, follow up with PCP, follow up C peptide/GAD abx.    See below for additional details  Hospital Course:  Assessment and Plan: DKA > Patient with distant history of diabetes (not sure where this is noted? In history, hx diabetes was denied? Prediabetes based on a1c in 2016 --- Will work ok clarifying) now presenting from PCP with elevated glucose in setting of 1 week of generalized weakness with associated polyuria and polydipsia. > Noted to have glucose of 739 in the ED.  With bicarb of 21 and gap of 19. > A1c already resulted at 11.8.  Beta hydroxybutyric acid 3.73. - basal/bolus regimen ordered -> discharging on lantus 18 units daily + aspart 3 units with meals - c peptide/GAD ab  ordered, these are pending at discharge he'll need endocrine follow up outpatient  - diabetes coordinator for education - will need further outpatient w/u and/or discussion regarding risk factor for his development of severe diabetes/dka -> a1c 2016 notable for prediabetes/insulin resistance -> related to HIV meds (ID this less likely)?  Consider pancreatic imaging outpatient given DKA presenting in patient over 60, will defer to endocrine and PCP outpatient?   AKI Resolved with IVF and treatment of DKA   HIV - Continue home Symtuza and Tivicay - ID doesn't think these meds related to his DM   COPD - Continue home Albuterol   Hyperlipidemia - Continue home rosuvastatin   Hypertension - resume amlodipine at discharge, he wasn't taking coreg     Procedures: none   Consultations: none  Discharge Exam: Vitals:   02/28/22 0620 02/28/22 0943  BP: 116/79 120/82  Pulse: 88   Resp: 18   Temp: 98.5 F (36.9 C)   SpO2:     Doing well Eager to discharge if able  General: No acute distress. Cardiovascular: RRR Lungs: unlabored Abdomen: Soft, nontender, nondistended  Neurological: Alert and oriented 3. Moves all extremities 4 with equal strength. Cranial nerves II through XII grossly intact. Extremities: No clubbing or cyanosis. No edema.  Discharge Instructions   Discharge Instructions     Ambulatory referral to Endocrinology   Complete by: As directed    Ambulatory referral to Nutrition and Diabetic Education   Complete by: As directed    New DM dx; A1C 11.8% on 02/26/22   Call MD for:  difficulty breathing, headache or visual disturbances   Complete by: As directed  Call MD for:  extreme fatigue   Complete by: As directed    Call MD for:  hives   Complete by: As directed    Call MD for:  persistant dizziness or light-headedness   Complete by: As directed    Call MD for:  persistant nausea and vomiting   Complete by: As directed    Call MD for:  redness,  tenderness, or signs of infection (pain, swelling, redness, odor or green/yellow discharge around incision site)   Complete by: As directed    Call MD for:  severe uncontrolled pain   Complete by: As directed    Call MD for:  temperature >100.4   Complete by: As directed    Diet - low sodium heart healthy   Complete by: As directed    Discharge instructions   Complete by: As directed    You were seen for new onset diabetes.   You've improved with treatment with insulin.  You should follow up with your PCP as an outpatient for additional adjustments to your regimen.  Check your blood sugars first thing when you wake up and before meals.  Keep track of your blood sugars and follow this up with your PCP.  I'm going to send you to see the endocrinologist.  This presentation of diabetes is unusual for your age.  I'm going to refer you to the endocrinologist.  Edwena Bunde send off C peptide levels and GAD antibodies.  These should be followed with your PCP as an outpatient.  Sometimes, I consider abdominal imaging (pancreas imaging) for this type of presentation, I'll defer this to your primary provider and your endocrinologist outpatient.  Return for new, recurrent, or worsening symptoms.  Please ask your PCP to request records from this hospitalization so they know what was done and what the next steps will be.   Increase activity slowly   Complete by: As directed       Allergies as of 02/28/2022   No Known Allergies      Medication List     STOP taking these medications    carvedilol 3.125 MG tablet Commonly known as: COREG       TAKE these medications    albuterol 108 (90 Base) MCG/ACT inhaler Commonly known as: VENTOLIN HFA Inhale 2 puffs into the lungs every 6 (six) hours as needed for wheezing or shortness of breath.   amLODipine 10 MG tablet Commonly known as: NORVASC Take 1 tablet (10 mg total) by mouth daily.   aspirin 81 MG chewable tablet Chew 1 tablet (81 mg total)  by mouth daily.   blood glucose meter kit and supplies Kit Dispense based on patient and insurance preference. Use up to four times daily as directed.   cyclobenzaprine 5 MG tablet Commonly known as: FLEXERIL Take 1 tablet (5 mg total) by mouth 3 (three) times daily as needed for muscle spasms.   insulin aspart 100 UNIT/ML FlexPen Commonly known as: NOVOLOG Inject 3 Units into the skin 3 (three) times daily with meals.   insulin glargine 100 UNIT/ML Solostar Pen Commonly known as: LANTUS Inject 18 Units into the skin daily.   meloxicam 15 MG tablet Commonly known as: MOBIC Take 1 tablet (15 mg total) by mouth daily.   oxymetazoline 0.05 % nasal spray Commonly known as: AFRIN Place 1 spray into both nostrils 2 (two) times daily as needed for congestion.   rosuvastatin 10 MG tablet Commonly known as: CRESTOR Take 1 tablet (10 mg total) by mouth  daily.   sildenafil 100 MG tablet Commonly known as: VIAGRA Take 1 tablet (100 mg total) by mouth daily as needed for erectile dysfunction.   Symtuza 800-150-200-10 MG Tabs Generic drug: Darunavir-Cobicistat-Emtricitabine-Tenofovir Alafenamide TAKE 1 TABLET BY MOUTH DAILY WITH BREAKFAST.   Tivicay 50 MG tablet Generic drug: dolutegravir TAKE 1 TABLET (50 MG TOTAL) BY MOUTH DAILY. What changed: how much to take       No Known Allergies    The results of significant diagnostics from this hospitalization (including imaging, microbiology, ancillary and laboratory) are listed below for reference.    Significant Diagnostic Studies: No results found.  Microbiology: No results found for this or any previous visit (from the past 240 hour(s)).   Labs: Basic Metabolic Panel: Recent Labs  Lab 02/26/22 2134 02/26/22 2247 02/27/22 0634 02/27/22 0944 02/28/22 0351  NA 137 135 136 135 133*  K 4.0 4.1 3.3* 3.2* 3.9  CL 100 102 100 99 100  CO2 $Re'24 23 23 25 22  'Nvs$ GLUCOSE 380* 324* 137* 173* 243*  BUN $Re'22 19 14 14 12  'pka$ CREATININE  1.56* 1.44* 1.19 1.17 1.17  CALCIUM 9.3 8.9 8.6* 8.5* 8.6*  MG  --   --   --   --  1.9  PHOS  --   --   --   --  2.1*   Liver Function Tests: Recent Labs  Lab 02/26/22 0938 02/26/22 1757 02/27/22 0944 02/28/22 0351  AST $Re'18 27 23 20  'XgA$ ALT 42 47* 36 26  ALKPHOS 166* 150* 107 95  BILITOT 0.6 1.0 0.6 0.4  PROT 9.3* 9.2* 7.3 6.6  ALBUMIN 4.2 3.9 3.0* 2.9*   Recent Labs  Lab 02/26/22 1757  LIPASE 48   No results for input(s): "AMMONIA" in the last 168 hours. CBC: Recent Labs  Lab 02/26/22 0938 02/26/22 1416 02/28/22 0351  WBC 10.6* 12.2* 10.2  NEUTROABS  --   --  6.4  HGB 16.1 17.0 13.6  HCT 47.5 46.9 38.4*  MCV 81.4 76.6* 77.1*  PLT 319.0 373 269   Cardiac Enzymes: No results for input(s): "CKTOTAL", "CKMB", "CKMBINDEX", "TROPONINI" in the last 168 hours. BNP: BNP (last 3 results) Recent Labs    04/20/21 1107  BNP 46.8    ProBNP (last 3 results) No results for input(s): "PROBNP" in the last 8760 hours.  CBG: Recent Labs  Lab 02/27/22 1507 02/27/22 1624 02/27/22 2109 02/28/22 0810 02/28/22 1229  GLUCAP 315* 296* 73 321* 297*       Signed:  Fayrene Helper MD.  [facility] 02/28/2022, 4:15 PM

## 2022-02-28 NOTE — Inpatient Diabetes Management (Addendum)
Inpatient Diabetes Program Recommendations  AACE/ADA: New Consensus Statement on Inpatient Glycemic Control   Target Ranges:  Prepandial:   less than 140 mg/dL      Peak postprandial:   less than 180 mg/dL (1-2 hours)      Critically ill patients:  140 - 180 mg/dL    Latest Reference Range & Units 02/28/22 08:10  Glucose-Capillary 70 - 99 mg/dL 321 (H)    Latest Reference Range & Units 02/27/22 10:38 02/27/22 11:26 02/27/22 13:49 02/27/22 15:07 02/27/22 16:24 02/27/22 21:09  Glucose-Capillary 70 - 99 mg/dL 223 (H) 205 (H) 267 (H) 315 (H) 296 (H) 73    Latest Reference Range & Units 02/20/15 11:23 02/26/22 11:43 02/26/22 17:57  Hemoglobin A1C 4.8 - 5.6 % 6.2 11.8 (H) 11.8 (H)   Review of Glycemic Control  Diabetes history: NO Outpatient Diabetes medications: NA Current orders for Inpatient glycemic control: Semglee 12 units Q24H, Novolog 3 units TID with meals, Novolog 0-9 units TID with meals, Novolog 0-5 units QHS   Inpatient Diabetes Program Recommendations:     Insulin: Please consider increasing Semglee to 16 units daily (if ordered, please order one time for Semglee 4 units x1 now for total of 16 units today).  HbgA1C:  A1C 11.8% on 02/26/22 indicating an average glucose of 292 mg/dl over the past 2-3 months.   Labs: Noted c-peptide and GAD antibodies labs pending.   NURSING: Please work with patient on insulin teaching and administration. Please allow patient to self administer insulin in case he is discharged new to insulin.  Outpatient DM: Please provide Rx for glucose monitoring kit (#[id number]) and FreeStyle Libre3 sensors [phone]).  If patient is discharged on insulin, please provide Rx for vial/syringe.  NOTE: Noted consult for Diabetes Coordinator. Diabetes Coordinator is not on campus over the weekend but available by pager from 8am to 5pm for questions or concerns. Patient newly dx with DM this admission. Inpatient diabetes coordinator spoke with patient over the  phone yesterday and asked bedside nursing to work with patient on insulin teaching and allow patient to administer his own insulin injections.   Addendum 02/28/22_0 :20-Was able to do FaceTime with patient to review insulin administration. Reviewed and demonstrated insulin administration with vial/syringe and insulin pens. Patient states he would prefer to use insulin vial/syringe. Informed patient that I would ask RN to allow him to practice drawing up insulin from vial to syringe and to allow him to self administer the insulin to himself. Discussed insulin storage, insulin injection sites, and importance of rotating insulin injection sites. Patient asked about using the FreeStyle Libre CGM for glucose monitoring (has seen commercials about it). Explained how to [employer] works and how he would use his phone app to read the glucose from the sensor. Will ask that patient would be provided with Rx for FreeStyle Libre3 sensors. Patient verbalized understanding of information discussed and states he has no questions regarding DM or insulin. Called [place], RN to make her aware I had FaceTimed with patient and asked that she take vial and syringe in for patient to practice drawing up insulin and allow him to self administer insulin to himself when it is time for him to get it again.   Thanks, Barnie Alderman, RN, MSN, [place] Diabetes Coordinator Inpatient Diabetes Program [phone] (Team Pager from 8am to [place])

## 2022-03-01 ENCOUNTER — Other Ambulatory Visit (HOSPITAL_COMMUNITY): Payer: Self-pay

## 2022-03-01 DIAGNOSIS — E1310 Other specified diabetes mellitus with ketoacidosis without coma: Secondary | ICD-10-CM | POA: Diagnosis not present

## 2022-03-01 LAB — COMPREHENSIVE METABOLIC PANEL
ALT: 22 U/L (ref 0–44)
AST: 18 U/L (ref 15–41)
Albumin: 2.9 g/dL — ABNORMAL LOW (ref 3.5–5.0)
Alkaline Phosphatase: 89 U/L (ref 38–126)
Anion gap: 9 (ref 5–15)
BUN: 10 mg/dL (ref 8–23)
CO2: 26 mmol/L (ref 22–32)
Calcium: 8.7 mg/dL — ABNORMAL LOW (ref 8.9–10.3)
Chloride: 103 mmol/L (ref 98–111)
Creatinine, Ser: 1.06 mg/dL (ref 0.61–1.24)
GFR, Estimated: >60 mL/min (ref >60)
Glucose, Bld: 145 mg/dL — ABNORMAL HIGH (ref 70–99)
Potassium: 3.4 mmol/L — ABNORMAL LOW (ref 3.5–5.1)
Sodium: 138 mmol/L (ref 135–145)
Total Bilirubin: 0.5 mg/dL (ref 0.3–1.2)
Total Protein: 6.7 g/dL (ref 6.5–8.1)

## 2022-03-01 LAB — CBC WITH DIFFERENTIAL/PLATELET
Abs Immature Granulocytes: 0.02 10*3/uL (ref 0.00–0.07)
Basophils Absolute: 0.1 10*3/uL (ref 0.0–0.1)
Basophils Relative: 1 %
Eosinophils Absolute: 0.1 10*3/uL (ref 0.0–0.5)
Eosinophils Relative: 1 %
HCT: 38.3 % — ABNORMAL LOW (ref 39.0–52.0)
Hemoglobin: 13.5 g/dL (ref 13.0–17.0)
Immature Granulocytes: 0 %
Lymphocytes Relative: 27 %
Lymphs Abs: 2.9 10*3/uL (ref 0.7–4.0)
MCH: 27.6 pg (ref 26.0–34.0)
MCHC: 35.2 g/dL (ref 30.0–36.0)
MCV: 78.3 fL — ABNORMAL LOW (ref 80.0–100.0)
Monocytes Absolute: 1 10*3/uL (ref 0.1–1.0)
Monocytes Relative: 9 %
Neutro Abs: 6.4 10*3/uL (ref 1.7–7.7)
Neutrophils Relative %: 62 %
Platelets: 245 10*3/uL (ref 150–400)
RBC: 4.89 MIL/uL (ref 4.22–5.81)
RDW: 13.4 % (ref 11.5–15.5)
WBC: 10.4 10*3/uL (ref 4.0–10.5)
nRBC: 0 % (ref 0.0–0.2)

## 2022-03-01 LAB — GLUCOSE, CAPILLARY
Glucose-Capillary: 140 mg/dL — ABNORMAL HIGH (ref 70–99)
Glucose-Capillary: 142 mg/dL — ABNORMAL HIGH (ref 70–99)
Glucose-Capillary: 238 mg/dL — ABNORMAL HIGH (ref 70–99)

## 2022-03-01 LAB — MAGNESIUM: Magnesium: 2 mg/dL (ref 1.7–2.4)

## 2022-03-01 LAB — PHOSPHORUS: Phosphorus: 3.3 mg/dL (ref 2.5–4.6)

## 2022-03-01 LAB — GLUTAMIC ACID DECARBOXYLASE AUTO ABS: Glutamic Acid Decarb Ab: <5 U/mL (ref 0.0–5.0)

## 2022-03-01 MED ORDER — INSULIN LISPRO (1 UNIT DIAL) 100 UNIT/ML (KWIKPEN)
3.0000 [IU] | PEN_INJECTOR | Freq: Three times a day (TID) | SUBCUTANEOUS | 2 refills | Status: DC
  Filled 2022-03-01: qty 3, 34d supply, fill #0

## 2022-03-01 MED ORDER — POTASSIUM CHLORIDE CRYS ER 20 MEQ PO TBCR
40.0000 meq | EXTENDED_RELEASE_TABLET | Freq: Once | ORAL | Status: AC
  Administered 2022-03-01: 40 meq via ORAL
  Filled 2022-03-01: qty 2

## 2022-03-01 MED ORDER — GLUCOSE BLOOD VI STRP
ORAL_STRIP | 0 refills | Status: AC
  Filled 2022-03-01: qty 100, 25d supply, fill #0

## 2022-03-01 MED ORDER — INSULIN PEN NEEDLE 32G X 4 MM MISC
0 refills | Status: DC
  Filled 2022-03-01: qty 100, 25d supply, fill #0

## 2022-03-01 MED ORDER — BLOOD GLUCOSE MONITOR KIT
PACK | 0 refills | Status: DC
  Filled 2022-03-01: qty 1, 30d supply, fill #0

## 2022-03-01 MED ORDER — FINGERSTIX LANCETS MISC
0 refills | Status: AC
  Filled 2022-03-01: qty 100, 25d supply, fill #0

## 2022-03-01 MED ORDER — INSULIN GLARGINE 100 UNIT/ML SOLOSTAR PEN
18.0000 [IU] | PEN_INJECTOR | Freq: Every day | SUBCUTANEOUS | 2 refills | Status: DC
  Filled 2022-03-01: qty 6, 33d supply, fill #0

## 2022-03-01 NOTE — Progress Notes (Signed)
Discharged yesterday, issues with medication, so discharge delayed until today.    Vitals:   02/28/22 1957 03/01/22 0639  BP: 120/79 113/78  Pulse: 87 81  Resp: 18 16  Temp: 98.6 F (37 C) 99 F (37.2 C)  SpO2: 96% 95%   NAD, in his own clothes, sitting on edge of bed No LEE Unlabored breathing   BG's improved today, stable for discharge.  Meds to be delivered from [place].  See discharge summary for additional details.

## 2022-03-01 NOTE — Progress Notes (Signed)
Mobility Specialist Progress Note    03/01/22 1000  Mobility  Activity Ambulated independently in hallway  Activity Response Tolerated well  Distance Ambulated (ft) 470 ft  $Mobility charge 1 Mobility  Level of Assistance Independent  Assistive Device None  Mobility Referral No   Pre-Mobility: 89 HR During Mobility: 107 HR Post-Mobility: 106 HR  Pt received in bed and agreeable. No complaints on walk. Returned to bed with call bell in reach.    Hildred Alamin Mobility Specialist

## 2022-03-01 NOTE — Inpatient Diabetes Management (Addendum)
Inpatient Diabetes Program Recommendations  AACE/ADA: New Consensus Statement on Inpatient Glycemic Control   Target Ranges:  Prepandial:   less than 140 mg/dL      Peak postprandial:   less than 180 mg/dL (1-2 hours)      Critically ill patients:  140 - 180 mg/dL    Latest Reference Range & Units 02/28/22 08:10 02/28/22 12:29 02/28/22 16:53 02/28/22 22:03 03/01/22 07:52  Glucose-Capillary 70 - 99 mg/dL 321 (H) 297 (H) 146 (H) 192 (H) 140 (H)   Review of Glycemic Control  Diabetes history: NO Outpatient Diabetes medications: NA Current orders for Inpatient glycemic control: Semglee 18 units Q24H, Novolog 3 units TID with meals, Novolog 0-9 units TID with meals, Novolog 0-5 units QHS   Inpatient Diabetes Program Recommendations:     Insulin: Please consider increasing meal coverage to Novolog 5 units TID with meals.    HbgA1C:  A1C 11.8% on 02/26/22 indicating an average glucose of 292 mg/dl over the past 2-3 months.   Labs: Noted c-peptide and GAD antibodies labs pending.   Outpatient DM: Please provide Rx for glucose monitoring kit (#[id number]) and FreeStyle Libre3 sensors [phone]).    NOTE: Educated patient on FreeStyle Libre3 CGM regarding application and changing CGM sensor (alternate every 14 days on back of arms), 1 hour warm-up, how to scan sensor to start sensor, explained that glucose reading transmitted to phone app every 1 minute, and how to open app to check glucose.  Patient has been given Freestyle Libre3 sensor samples.  Provided educational packet regarding FreeStyle Libre3 CGM. Assisted patient to apply to back of upper left arm at 8:30 am today. Explained that glucose readings will not be available until 1 hour after application. Patient plans to use iphone FreeStyle Libre3 app to read [employer] sensor. Asked patient to have PCP continue to provide Rx for FreeStyle Libre3 sensors going forward. Asked patient to be sure to let PCP know about Maurine Simmering and allow provider  to review reports from [place] app so the provider can use the information to continue to make adjustments with DM medications if needed. Patient verbalized understanding of information and has no questions at this time. Reviewed insulin administration again. Patient reports that he has not been able to practice drawing up insulin yet. Allowed patient to draw up insulin using vial/syringe. Patient states that he feels that he may not draw insulin up correctly. Educated him on insulin pen again and he was able to successfully demonstrate how to use it. Therefore, he would now like to use insulin pens outpatient and will also need insulin pen needles. Looks like his insurance prefers Lantus Solostar [phone]), Cleda Clarks [phone]), insulin pen needles [phone]); please also provide Rx for FreeStyle Libre3 sensors [phone]).  Thanks, Barnie Alderman, RN, MSN, [place] Diabetes Coordinator Inpatient Diabetes Program [phone] (Team Pager from 8am to [place])

## 2022-03-02 ENCOUNTER — Telehealth: Payer: Self-pay

## 2022-03-02 LAB — C-PEPTIDE: C-Peptide: 1.2 ng/mL (ref 1.1–4.4)

## 2022-03-02 NOTE — Discharge Summary (Signed)
Physician Discharge Summary (initially written in progress note from 10/8 inadvertently)  Ronald Castaneda FOY:[id number] DOB: 02-06-1956 DOA: 02/26/2022  PCP: Hoyt Koch, MD  Admit date: 02/26/2022 Discharge date: 03/02/2022  Time spent: 40 minutes  Recommendations for Outpatient Follow-up:  Follow outpatient CBC/CMP  Ensure endocrine follow up Has pending c peptide and gad ab which are pending at the time of discharge Consider abdominal imaging (pancreatic imaging) outpatient given presentation of diabetes, will defer to PCP/endocrine outpatient   Discharge Diagnoses:  Principal Problem:   DKA (diabetic ketoacidosis) ([place]) Active Problems:   HIV disease ([place])   Depression   COPD (chronic obstructive pulmonary disease) ([place])   Hypercholesteremia   Benign hypertension   AKI (acute kidney injury) ([place])   Discharge Condition: stable  Diet recommendation: diabetic  Filed Weights   02/26/22 1403 02/26/22 2106  Weight: 69.4 kg 67.2 kg    History of present illness:  Ronald Castaneda is Ronald Castaneda 66 y.o. male with medical history significant of HIV, depression, COPD, hyperlipidemia, hypertension, chronic pain presenting with DKA.  He's improved with treatment with insulin.  Discharged with insulin prescription and education.  Referral to endocrine outpatient, follow up with PCP, follow up C peptide/GAD abx.    See below for additional details  Hospital Course:  Assessment and Plan: DKA > Patient with distant history of diabetes (not sure where this is noted? In history, hx diabetes was denied? Prediabetes based on a1c in 2016 --- Will work ok clarifying) now presenting from PCP with elevated glucose in setting of 1 week of generalized weakness with associated polyuria and polydipsia. > Noted to have glucose of 739 in the ED.  With bicarb of 21 and gap of 19. > A1c already resulted at 11.8.  Beta hydroxybutyric acid 3.73. - basal/bolus regimen ordered -> discharging on lantus 18  units daily + aspart 3 units with meals - c peptide/GAD ab ordered, these are pending at discharge he'll need endocrine follow up outpatient  - diabetes coordinator for education - will need further outpatient w/u and/or discussion regarding risk factor for his development of severe diabetes/dka -> a1c 2016 notable for prediabetes/insulin resistance -> related to HIV meds (ID this less likely)?  Consider pancreatic imaging outpatient given DKA presenting in patient over 60, will defer to endocrine and PCP outpatient?   AKI Resolved with IVF and treatment of DKA   HIV - Continue home Symtuza and Tivicay - ID doesn't think these meds related to his DM   COPD - Continue home Albuterol   Hyperlipidemia - Continue home rosuvastatin   Hypertension - resume amlodipine at discharge, he wasn't taking coreg     Procedures: none   Consultations: none  Discharge Exam: Vitals:   02/28/22 1957 03/01/22 0639  BP: 120/79 113/78  Pulse: 87 81  Resp: 18 16  Temp: 98.6 F (37 C) 99 F (37.2 C)  SpO2: 96% 95%   Doing well Eager to discharge if able  General: No acute distress. Cardiovascular: RRR Lungs: unlabored Abdomen: Soft, nontender, nondistended  Neurological: Alert and oriented 3. Moves all extremities 4 with equal strength. Cranial nerves II through XII grossly intact. Extremities: No clubbing or cyanosis. No edema.  Discharge Instructions   Discharge Instructions     Ambulatory referral to Endocrinology   Complete by: As directed    Ambulatory referral to Nutrition and Diabetic Education   Complete by: As directed    New DM dx; A1C 11.8% on 02/26/22   Call MD for:  difficulty  breathing, headache or visual disturbances   Complete by: As directed    Call MD for:  extreme fatigue   Complete by: As directed    Call MD for:  hives   Complete by: As directed    Call MD for:  persistant dizziness or light-headedness   Complete by: As directed    Call MD for:   persistant nausea and vomiting   Complete by: As directed    Call MD for:  redness, tenderness, or signs of infection (pain, swelling, redness, odor or green/yellow discharge around incision site)   Complete by: As directed    Call MD for:  severe uncontrolled pain   Complete by: As directed    Call MD for:  temperature >100.4   Complete by: As directed    Diet - low sodium heart healthy   Complete by: As directed    Discharge instructions   Complete by: As directed    You were seen for new onset diabetes.   You've improved with treatment with insulin.  You should follow up with your PCP as an outpatient for additional adjustments to your regimen.  Check your blood sugars first thing when you wake up and before meals.  Keep track of your blood sugars and follow this up with your PCP.  I'm going to send you to see the endocrinologist.  This presentation of diabetes is unusual for your age.  I'm going to refer you to the endocrinologist.  Edwena Bunde send off C peptide levels and GAD antibodies.  These should be followed with your PCP as an outpatient.  Sometimes, I consider abdominal imaging (pancreas imaging) for this type of presentation, I'll defer this to your primary provider and your endocrinologist outpatient.  Return for new, recurrent, or worsening symptoms.  Please ask your PCP to request records from this hospitalization so they know what was done and what the next steps will be.   Increase activity slowly   Complete by: As directed       Allergies as of 03/01/2022   No Known Allergies      Medication List     STOP taking these medications    carvedilol 3.125 MG tablet Commonly known as: COREG       TAKE these medications    albuterol 108 (90 Base) MCG/ACT inhaler Commonly known as: VENTOLIN HFA Inhale 2 puffs into the lungs every 6 (six) hours as needed for wheezing or shortness of breath.   amLODipine 10 MG tablet Commonly known as: NORVASC Take 1 tablet (10 mg  total) by mouth daily.   aspirin 81 MG chewable tablet Chew 1 tablet (81 mg total) by mouth daily.   blood glucose meter kit and supplies Kit Dispense based on patient and insurance preference. Use up to four times daily as directed.   True Metrix Meter w/Device Kit Use up to four times daily as directed.   cyclobenzaprine 5 MG tablet Commonly known as: FLEXERIL Take 1 tablet (5 mg total) by mouth 3 (three) times daily as needed for muscle spasms.   FreeStyle Libre 3 Sensor Misc Place 1 sensor on the skin every 14 days. Use to check glucose continuously   HumaLOG KwikPen 100 UNIT/ML KwikPen Generic drug: insulin lispro Inject 3 Units into the skin 3 (three) times daily with meals.   Lantus SoloStar 100 UNIT/ML Solostar Pen Generic drug: insulin glargine Inject 18 Units into the skin daily.   meloxicam 15 MG tablet Commonly known as: MOBIC Take 1  tablet (15 mg total) by mouth daily.   oxymetazoline 0.05 % nasal spray Commonly known as: AFRIN Place 1 spray into both nostrils 2 (two) times daily as needed for congestion.   rosuvastatin 10 MG tablet Commonly known as: CRESTOR Take 1 tablet (10 mg total) by mouth daily.   sildenafil 100 MG tablet Commonly known as: VIAGRA Take 1 tablet (100 mg total) by mouth daily as needed for erectile dysfunction.   Symtuza 800-150-200-10 MG Tabs Generic drug: Darunavir-Cobicistat-Emtricitabine-Tenofovir Alafenamide TAKE 1 TABLET BY MOUTH DAILY WITH BREAKFAST.   Tivicay 50 MG tablet Generic drug: dolutegravir TAKE 1 TABLET (50 MG TOTAL) BY MOUTH DAILY. What changed: how much to take       ASK your doctor about these medications    BD Pen Needle Nano U/F 32G X 4 MM Misc Generic drug: Insulin Pen Needle use as directed   True Metrix Blood Glucose Test test strip Generic drug: glucose blood use as needed to check blood sugars up to 4 times daily   TRUEplus Lancets 28G Misc Use as needed to check blood sugars up to 4 times  daily       Not on File    The results of significant diagnostics from this hospitalization (including imaging, microbiology, ancillary and laboratory) are listed below for reference.    Significant Diagnostic Studies: No results found.  Microbiology: No results found for this or any previous visit (from the past 240 hour(s)).   Labs: Basic Metabolic Panel: Recent Labs  Lab 02/26/22 2247 02/27/22 0634 02/27/22 0944 02/28/22 0351 03/01/22 0215  NA 135 136 135 133* 138  K 4.1 3.3* 3.2* 3.9 3.4*  CL 102 100 99 100 103  CO2 _0 GLUCOSE 324* 137* 173* 243* 145*  BUN _1 CREATININE 1.44* 1.19 1.17 1.17 1.06  CALCIUM 8.9 8.6* 8.5* 8.6* 8.7*  MG  --   --   --  1.9 2.0  PHOS  --   --   --  2.1* 3.3   Liver Function Tests: Recent Labs  Lab 02/26/22 0938 02/26/22 1757 02/27/22 0944 02/28/22 0351 03/01/22 0215  AST _2 ALT 42 47* 36 26 22  ALKPHOS 166* 150* 107 95 89  BILITOT 0.6 1.0 0.6 0.4 0.5  PROT 9.3* 9.2* 7.3 6.6 6.7  ALBUMIN 4.2 3.9 3.0* 2.9* 2.9*   Recent Labs  Lab 02/26/22 1757  LIPASE 48   No results for input(s): "AMMONIA" in the last 168 hours. CBC: Recent Labs  Lab 02/26/22 0938 02/26/22 1416 02/28/22 0351 03/01/22 0215  WBC 10.6* 12.2* 10.2 10.4  NEUTROABS  --   --  6.4 6.4  HGB 16.1 17.0 13.6 13.5  HCT 47.5 46.9 38.4* 38.3*  MCV 81.4 76.6* 77.1* 78.3*  PLT 319.0 373 269 245   Cardiac Enzymes: No results for input(s): "CKTOTAL", "CKMB", "CKMBINDEX", "TROPONINI" in the last 168 hours. BNP: BNP (last 3 results) Recent Labs    04/20/21 1107  BNP 46.8    ProBNP (last 3 results) No results for input(s): "PROBNP" in the last 8760 hours.  CBG: Recent Labs  Lab 02/28/22 1653 02/28/22 2203 03/01/22 0752 03/01/22 1124 03/01/22 1728  GLUCAP 146* 192* 140* 142* 238*       Signed:  Fayrene Helper MD.  [facility] 03/02/2022, 7:54 PM

## 2022-03-02 NOTE — Telephone Encounter (Signed)
Transition Care Management Follow-up Telephone Call Date of discharge and from where: 03/01/2022 from [facility] (Dx: E11.10 DKA) How have you been since you were released from the hospital? Per patient: "I never thought I would have diabetes; this is wild and crazy". Any questions or concerns? Yes; Need additional information on diabetes and how to maintain the disease.  Items Reviewed: Did the pt receive and understand the discharge instructions provided? Yes  Medications obtained and verified? Yes  Other? No  Any new allergies since your discharge? No  Dietary orders reviewed? Yes Do you have support at home? Yes   Home Care and Equipment/Supplies: Were home health services ordered? no If so, what is the name of the agency? no  Has the agency set up a time to come to the patient's home? not applicable Were any new equipment or medical supplies ordered?  Yes: continuous glucose monitor What is the name of the medical supply agency? N/a Were you able to get the supplies/equipment? yes Do you have any questions related to the use of the equipment or supplies? No  Functional Questionnaire: (I = Independent and D = Dependent) ADLs: I  Bathing/Dressing- I  Meal Prep- I  Eating- I  Maintaining continence- I  Transferring/Ambulation- I  Managing Meds- I  Follow up appointments reviewed:  PCP Hospital f/u appt confirmed? Yes  Scheduled to see Dr. Pricilla Holm on 03/05/2022 @ 10:00 am. [place] [facility] f/u appt confirmed? No   Are transportation arrangements needed? No  If their condition worsens, is the pt aware to call PCP or go to the Emergency Dept.? Yes Was the patient provided with contact information for the PCP's office or ED? Yes Was to pt encouraged to call back with questions or concerns? Yes

## 2022-03-05 ENCOUNTER — Encounter: Payer: Self-pay | Admitting: Internal Medicine

## 2022-03-05 ENCOUNTER — Ambulatory Visit (INDEPENDENT_AMBULATORY_CARE_PROVIDER_SITE_OTHER): Payer: 59 | Admitting: Internal Medicine

## 2022-03-05 VITALS — BP 120/76 | HR 90 | Temp 98.9°F | Ht 69.5 in | Wt 158.0 lb

## 2022-03-05 DIAGNOSIS — E119 Type 2 diabetes mellitus without complications: Secondary | ICD-10-CM

## 2022-03-05 DIAGNOSIS — E1310 Other specified diabetes mellitus with ketoacidosis without coma: Secondary | ICD-10-CM | POA: Diagnosis not present

## 2022-03-05 DIAGNOSIS — N179 Acute kidney failure, unspecified: Secondary | ICD-10-CM

## 2022-03-05 MED ORDER — INSULIN GLARGINE 100 UNIT/ML SOLOSTAR PEN: 18.0000 [IU] | PEN_INJECTOR | Freq: Every day | SUBCUTANEOUS | 11 refills | Status: DC

## 2022-03-05 MED ORDER — FREESTYLE LIBRE 3 SENSOR MISC: 11 refills | Status: DC

## 2022-03-05 MED ORDER — INSULIN LISPRO (1 UNIT DIAL) 100 UNIT/ML (KWIKPEN): 3.0000 [IU] | PEN_INJECTOR | Freq: Three times a day (TID) | SUBCUTANEOUS | 11 refills | Status: DC

## 2022-03-05 MED ORDER — FLUCONAZOLE 150 MG PO TABS: 150.0000 mg | ORAL_TABLET | ORAL | 0 refills | Status: DC

## 2022-03-05 NOTE — Assessment & Plan Note (Addendum)
Needs CT abdomen with contrast for pancreas protocol to check given new onset DKA at 78. Ordered as well as diabetic education and referral to endo. Work note done and would advise to stay off work until 03/22/22. Return date 03/22/22.

## 2022-03-05 NOTE — Progress Notes (Signed)
   Subjective:   Patient ID: Ronald Castaneda, male    DOB: 11-12-1955, 66 y.o.   MRN: [id number]  HPI The patient is a 66 YO man coming in for hospital follow up (admitted with DKA found on labs at our office), new thirst in the last while. Started on insulin drip and transitioned to insulin regimen at the hospital. Discharged with lantus and humalog regimen. He is monitoring sugars at home  PMH, [facility], social history reviewed and updated  Review of Systems  Constitutional:  Positive for activity change, appetite change and fatigue.  HENT: Negative.    Eyes: Negative.   Respiratory:  Negative for cough, chest tightness and shortness of breath.   Cardiovascular:  Negative for chest pain, palpitations and leg swelling.  Gastrointestinal:  Negative for abdominal distention, abdominal pain, constipation, diarrhea, nausea and vomiting.  Musculoskeletal: Negative.   Skin: Negative.   Neurological: Negative.   Psychiatric/Behavioral: Negative.      Objective:  Physical Exam Constitutional:      Appearance: He is well-developed.  HENT:     Head: Normocephalic and atraumatic.  Cardiovascular:     Rate and Rhythm: Normal rate and regular rhythm.  Pulmonary:     Effort: Pulmonary effort is normal. No respiratory distress.     Breath sounds: Normal breath sounds. No wheezing or rales.  Abdominal:     General: Bowel sounds are normal. There is no distension.     Palpations: Abdomen is soft.     Tenderness: There is no abdominal tenderness. There is no rebound.  Musculoskeletal:     Cervical back: Normal range of motion.  Skin:    General: Skin is warm and dry.  Neurological:     Mental Status: He is alert and oriented to person, place, and time.     Coordination: Coordination normal.     Vitals:   03/05/22 0946  BP: 120/76  Pulse: 90  Temp: 98.9 F (37.2 C)  TempSrc: Oral  SpO2: 92%  Weight: 158 lb (71.7 kg)  Height: 5' 9.5" (1.765 m)    Assessment & Plan:  Visit time 25  minutes in face to face communication with patient and coordination of care, additional 10 minutes spent in record review, coordination or care, ordering tests, communicating/referring to other healthcare professionals, documenting in medical records all on the same day of the visit for total time 35 minutes spent on the visit.

## 2022-03-05 NOTE — Assessment & Plan Note (Signed)
Referral to endo done and diabetes education. Counseled about diet during visit. Checked data on freestyle which he is using successfully. Refilled the sensors. He is taking lantus 18 units daily as well as humalog 3 units with meals. Has had a few sugars to 300 after meal. We will keep lantus 18 units daily and do humalog 3 units small meal, 5 units large or medium size meal. He will contact us for ongoing high sugars. Ordered CT abdomen/pelvis with contrast for pancreas given new onset diabetes at 85.

## 2022-03-05 NOTE — Patient Instructions (Signed)
We will keep lantus 18 units daily.  We will have you do humalog with meals. For smaller meal take 3 units with the meal. For a large meal take 5 units at the meal.  We have sent in diflucan to take 1 pill today and then 1 pill Monday to clear the rash.  We will get a stomach scan to check the pancreas.  We will get you in with the nutritionist and with the diabetes specialist.

## 2022-03-05 NOTE — Assessment & Plan Note (Signed)
Resolved in patient and no labs indicated today.

## 2022-03-09 ENCOUNTER — Inpatient Hospital Stay (HOSPITAL_COMMUNITY): Payer: No Typology Code available for payment source | Admitting: Anesthesiology

## 2022-03-09 ENCOUNTER — Other Ambulatory Visit: Payer: Self-pay

## 2022-03-09 ENCOUNTER — Encounter (HOSPITAL_COMMUNITY): Payer: Self-pay | Admitting: *Deleted

## 2022-03-09 ENCOUNTER — Encounter (HOSPITAL_COMMUNITY)
Admission: EM | Disposition: A | Discharge status: Home / Self Care | Payer: Self-pay | Source: Home / Self Care | Attending: Internal Medicine

## 2022-03-09 ENCOUNTER — Emergency Department (HOSPITAL_COMMUNITY): Payer: No Typology Code available for payment source

## 2022-03-09 ENCOUNTER — Ambulatory Visit (INDEPENDENT_AMBULATORY_CARE_PROVIDER_SITE_OTHER): Payer: 59 | Admitting: Internal Medicine

## 2022-03-09 ENCOUNTER — Encounter: Payer: Self-pay | Admitting: Internal Medicine

## 2022-03-09 ENCOUNTER — Inpatient Hospital Stay (HOSPITAL_COMMUNITY)
Admission: EM | Admit: 2022-03-09 | Discharge: 2022-03-15 | DRG: 580 | Disposition: A | Discharge status: Home / Self Care | Payer: No Typology Code available for payment source | Attending: Internal Medicine | Admitting: Internal Medicine

## 2022-03-09 DIAGNOSIS — L03113 Cellulitis of right upper limb: Secondary | ICD-10-CM | POA: Insufficient documentation

## 2022-03-09 DIAGNOSIS — E1169 Type 2 diabetes mellitus with other specified complication: Secondary | ICD-10-CM | POA: Diagnosis not present

## 2022-03-09 DIAGNOSIS — I10 Essential (primary) hypertension: Secondary | ICD-10-CM | POA: Diagnosis present

## 2022-03-09 DIAGNOSIS — Z79899 Other long term (current) drug therapy: Secondary | ICD-10-CM | POA: Diagnosis not present

## 2022-03-09 DIAGNOSIS — L089 Local infection of the skin and subcutaneous tissue, unspecified: Secondary | ICD-10-CM | POA: Diagnosis not present

## 2022-03-09 DIAGNOSIS — Z87891 Personal history of nicotine dependence: Secondary | ICD-10-CM

## 2022-03-09 DIAGNOSIS — L02511 Cutaneous abscess of right hand: Secondary | ICD-10-CM | POA: Diagnosis present

## 2022-03-09 DIAGNOSIS — J449 Chronic obstructive pulmonary disease, unspecified: Secondary | ICD-10-CM | POA: Diagnosis present

## 2022-03-09 DIAGNOSIS — Z794 Long term (current) use of insulin: Secondary | ICD-10-CM | POA: Diagnosis not present

## 2022-03-09 DIAGNOSIS — B9561 Methicillin susceptible Staphylococcus aureus infection as the cause of diseases classified elsewhere: Secondary | ICD-10-CM | POA: Diagnosis present

## 2022-03-09 DIAGNOSIS — IMO0001 Reserved for inherently not codable concepts without codable children: Secondary | ICD-10-CM | POA: Diagnosis present

## 2022-03-09 DIAGNOSIS — Z8249 Family history of ischemic heart disease and other diseases of the circulatory system: Secondary | ICD-10-CM

## 2022-03-09 DIAGNOSIS — B20 Human immunodeficiency virus [HIV] disease: Secondary | ICD-10-CM | POA: Diagnosis present

## 2022-03-09 DIAGNOSIS — E876 Hypokalemia: Secondary | ICD-10-CM | POA: Diagnosis not present

## 2022-03-09 DIAGNOSIS — L03011 Cellulitis of right finger: Secondary | ICD-10-CM | POA: Diagnosis present

## 2022-03-09 DIAGNOSIS — Z791 Long term (current) use of non-steroidal anti-inflammatories (NSAID): Secondary | ICD-10-CM

## 2022-03-09 DIAGNOSIS — E119 Type 2 diabetes mellitus without complications: Secondary | ICD-10-CM

## 2022-03-09 DIAGNOSIS — Z833 Family history of diabetes mellitus: Secondary | ICD-10-CM | POA: Diagnosis not present

## 2022-03-09 DIAGNOSIS — M79641 Pain in right hand: Secondary | ICD-10-CM | POA: Insufficient documentation

## 2022-03-09 DIAGNOSIS — Z7982 Long term (current) use of aspirin: Secondary | ICD-10-CM | POA: Diagnosis not present

## 2022-03-09 DIAGNOSIS — Z21 Asymptomatic human immunodeficiency virus [HIV] infection status: Secondary | ICD-10-CM | POA: Diagnosis not present

## 2022-03-09 DIAGNOSIS — S61431A Puncture wound without foreign body of right hand, initial encounter: Secondary | ICD-10-CM | POA: Diagnosis present

## 2022-03-09 DIAGNOSIS — F32A Depression, unspecified: Secondary | ICD-10-CM | POA: Diagnosis present

## 2022-03-09 DIAGNOSIS — N529 Male erectile dysfunction, unspecified: Secondary | ICD-10-CM | POA: Diagnosis present

## 2022-03-09 DIAGNOSIS — F17200 Nicotine dependence, unspecified, uncomplicated: Secondary | ICD-10-CM | POA: Diagnosis present

## 2022-03-09 DIAGNOSIS — E7800 Pure hypercholesterolemia, unspecified: Secondary | ICD-10-CM | POA: Diagnosis present

## 2022-03-09 HISTORY — PX: I & D EXTREMITY: SHX5045

## 2022-03-09 LAB — CBC WITH DIFFERENTIAL/PLATELET
Abs Immature Granulocytes: 0.1 10*3/uL — ABNORMAL HIGH (ref 0.00–0.07)
Basophils Absolute: 0.1 10*3/uL (ref 0.0–0.1)
Basophils Relative: 0 %
Eosinophils Absolute: 0 10*3/uL (ref 0.0–0.5)
Eosinophils Relative: 0 %
HCT: 36.6 % — ABNORMAL LOW (ref 39.0–52.0)
Hemoglobin: 12.7 g/dL — ABNORMAL LOW (ref 13.0–17.0)
Immature Granulocytes: 1 %
Lymphocytes Relative: 10 %
Lymphs Abs: 1.9 10*3/uL (ref 0.7–4.0)
MCH: 27 pg (ref 26.0–34.0)
MCHC: 34.7 g/dL (ref 30.0–36.0)
MCV: 77.7 fL — ABNORMAL LOW (ref 80.0–100.0)
Monocytes Absolute: 1.4 10*3/uL — ABNORMAL HIGH (ref 0.1–1.0)
Monocytes Relative: 7 %
Neutro Abs: 15.9 10*3/uL — ABNORMAL HIGH (ref 1.7–7.7)
Neutrophils Relative %: 82 %
Platelets: 313 10*3/uL (ref 150–400)
RBC: 4.71 MIL/uL (ref 4.22–5.81)
RDW: 13.5 % (ref 11.5–15.5)
WBC: 19.4 10*3/uL — ABNORMAL HIGH (ref 4.0–10.5)
nRBC: 0 % (ref 0.0–0.2)

## 2022-03-09 LAB — BASIC METABOLIC PANEL
Anion gap: 9 (ref 5–15)
BUN: 13 mg/dL (ref 8–23)
CO2: 24 mmol/L (ref 22–32)
Calcium: 8.7 mg/dL — ABNORMAL LOW (ref 8.9–10.3)
Chloride: 100 mmol/L (ref 98–111)
Creatinine, Ser: 1.02 mg/dL (ref 0.61–1.24)
GFR, Estimated: >60 mL/min (ref >60)
Glucose, Bld: 240 mg/dL — ABNORMAL HIGH (ref 70–99)
Potassium: 3.8 mmol/L (ref 3.5–5.1)
Sodium: 133 mmol/L — ABNORMAL LOW (ref 135–145)

## 2022-03-09 LAB — CBG MONITORING, ED
Glucose-Capillary: 252 mg/dL — ABNORMAL HIGH (ref 70–99)
Glucose-Capillary: 173 mg/dL — ABNORMAL HIGH (ref 70–99)

## 2022-03-09 LAB — GLUCOSE, CAPILLARY
Glucose-Capillary: 118 mg/dL — ABNORMAL HIGH (ref 70–99)
Glucose-Capillary: 172 mg/dL — ABNORMAL HIGH (ref 70–99)
Glucose-Capillary: 97 mg/dL (ref 70–99)

## 2022-03-09 SURGERY — IRRIGATION AND DEBRIDEMENT EXTREMITY
Anesthesia: General | Site: Hand | Laterality: Right

## 2022-03-09 MED ORDER — SODIUM CHLORIDE 0.9 % IV SOLN
2.0000 g | Freq: Once | INTRAVENOUS | Status: AC
  Administered 2022-03-09: 2 g via INTRAVENOUS
  Filled 2022-03-09: qty 12.5

## 2022-03-09 MED ORDER — SODIUM CHLORIDE 0.9 % IV SOLN
2.0000 g | Freq: Three times a day (TID) | INTRAVENOUS | Status: DC
  Administered 2022-03-09 – 2022-03-10 (×3): 2 g via INTRAVENOUS
  Filled 2022-03-09 (×4): qty 12.5

## 2022-03-09 MED ORDER — ONDANSETRON HCL 4 MG/2ML IJ SOLN: 4.0000 mg | Freq: Once | INTRAMUSCULAR | Status: DC | PRN

## 2022-03-09 MED ORDER — LIDOCAINE 2% (20 MG/ML) 5 ML SYRINGE
INTRAMUSCULAR | Status: DC | PRN
  Administered 2022-03-09: 60 mg via INTRAVENOUS

## 2022-03-09 MED ORDER — AMLODIPINE BESYLATE 10 MG PO TABS
10.0000 mg | ORAL_TABLET | Freq: Every day | ORAL | Status: DC
  Administered 2022-03-10 – 2022-03-15 (×6): 10 mg via ORAL
  Filled 2022-03-09: qty 2
  Filled 2022-03-09 (×5): qty 1

## 2022-03-09 MED ORDER — ORAL CARE MOUTH RINSE: 15.0000 mL | Freq: Once | OROMUCOSAL | Status: AC

## 2022-03-09 MED ORDER — MORPHINE SULFATE (PF) 2 MG/ML IV SOLN
2.0000 mg | INTRAVENOUS | Status: DC | PRN
  Administered 2022-03-10 – 2022-03-12 (×7): 2 mg via INTRAVENOUS
  Filled 2022-03-09 (×7): qty 1

## 2022-03-09 MED ORDER — DOCUSATE SODIUM 100 MG PO CAPS
100.0000 mg | ORAL_CAPSULE | Freq: Two times a day (BID) | ORAL | Status: DC
  Administered 2022-03-09 – 2022-03-15 (×12): 100 mg via ORAL
  Filled 2022-03-09 (×12): qty 1

## 2022-03-09 MED ORDER — 0.9 % SODIUM CHLORIDE (POUR BTL) OPTIME
TOPICAL | Status: DC | PRN
  Administered 2022-03-09: 1000 mL

## 2022-03-09 MED ORDER — ACETAMINOPHEN 650 MG RE SUPP: 650.0000 mg | Freq: Four times a day (QID) | RECTAL | Status: DC | PRN

## 2022-03-09 MED ORDER — ACETAMINOPHEN 10 MG/ML IV SOLN: 1000.0000 mg | Freq: Once | INTRAVENOUS | Status: DC | PRN

## 2022-03-09 MED ORDER — POVIDONE-IODINE 10 % EX SWAB
2.0000 | Freq: Once | CUTANEOUS | Status: AC
  Administered 2022-03-09: 2 via TOPICAL

## 2022-03-09 MED ORDER — ROSUVASTATIN CALCIUM 5 MG PO TABS
10.0000 mg | ORAL_TABLET | Freq: Every day | ORAL | Status: DC
  Administered 2022-03-10 – 2022-03-15 (×6): 10 mg via ORAL
  Filled 2022-03-09: qty 2
  Filled 2022-03-09: qty 1
  Filled 2022-03-09 (×4): qty 2

## 2022-03-09 MED ORDER — SENNOSIDES-DOCUSATE SODIUM 8.6-50 MG PO TABS
1.0000 | ORAL_TABLET | Freq: Every evening | ORAL | Status: DC | PRN
  Administered 2022-03-13 – 2022-03-14 (×2): 1 via ORAL
  Filled 2022-03-09 (×2): qty 1

## 2022-03-09 MED ORDER — DARUN-COBIC-EMTRICIT-TENOFAF 800-150-200-10 MG PO TABS
1.0000 | ORAL_TABLET | Freq: Every day | ORAL | Status: DC
  Administered 2022-03-10 – 2022-03-15 (×6): 1 via ORAL
  Filled 2022-03-09 (×8): qty 1

## 2022-03-09 MED ORDER — MIDAZOLAM HCL 2 MG/2ML IJ SOLN
INTRAMUSCULAR | Status: DC | PRN
  Administered 2022-03-09: 2 mg via INTRAVENOUS

## 2022-03-09 MED ORDER — ENOXAPARIN SODIUM 40 MG/0.4ML IJ SOSY
40.0000 mg | PREFILLED_SYRINGE | INTRAMUSCULAR | Status: DC
  Administered 2022-03-10 – 2022-03-15 (×6): 40 mg via SUBCUTANEOUS
  Filled 2022-03-09 (×6): qty 0.4

## 2022-03-09 MED ORDER — INSULIN LISPRO (1 UNIT DIAL) 100 UNIT/ML (KWIKPEN): 3.0000 [IU] | PEN_INJECTOR | Freq: Three times a day (TID) | SUBCUTANEOUS | 11 refills | Status: DC

## 2022-03-09 MED ORDER — INSULIN ASPART 100 UNIT/ML IJ SOLN
INTRAMUSCULAR | Status: AC
  Filled 2022-03-09: qty 1

## 2022-03-09 MED ORDER — DOLUTEGRAVIR SODIUM 50 MG PO TABS
50.0000 mg | ORAL_TABLET | Freq: Every day | ORAL | Status: DC
  Administered 2022-03-10 – 2022-03-15 (×6): 50 mg via ORAL
  Filled 2022-03-09 (×7): qty 1

## 2022-03-09 MED ORDER — LACTATED RINGERS IV SOLN: INTRAVENOUS | Status: DC

## 2022-03-09 MED ORDER — OXYCODONE HCL 5 MG PO TABS
5.0000 mg | ORAL_TABLET | ORAL | Status: DC | PRN
  Administered 2022-03-09 – 2022-03-14 (×20): 5 mg via ORAL
  Filled 2022-03-09 (×21): qty 1

## 2022-03-09 MED ORDER — KETOROLAC TROMETHAMINE 30 MG/ML IJ SOLN
30.0000 mg | Freq: Once | INTRAMUSCULAR | Status: AC
  Administered 2022-03-09: 30 mg via INTRAVENOUS

## 2022-03-09 MED ORDER — CYCLOBENZAPRINE HCL 5 MG PO TABS
5.0000 mg | ORAL_TABLET | Freq: Three times a day (TID) | ORAL | Status: DC | PRN
  Administered 2022-03-10 – 2022-03-14 (×10): 5 mg via ORAL
  Filled 2022-03-09 (×10): qty 1

## 2022-03-09 MED ORDER — VANCOMYCIN HCL 1500 MG/300ML IV SOLN
1500.0000 mg | Freq: Once | INTRAVENOUS | Status: AC
  Administered 2022-03-09: 1500 mg via INTRAVENOUS
  Filled 2022-03-09 (×2): qty 300

## 2022-03-09 MED ORDER — FENTANYL CITRATE (PF) 100 MCG/2ML IJ SOLN
25.0000 ug | INTRAMUSCULAR | Status: DC | PRN
  Administered 2022-03-09 (×2): 25 ug via INTRAVENOUS
  Administered 2022-03-09: 50 ug via INTRAVENOUS

## 2022-03-09 MED ORDER — IOHEXOL 300 MG/ML  SOLN
100.0000 mL | Freq: Once | INTRAMUSCULAR | Status: AC | PRN
  Administered 2022-03-09: 100 mL via INTRAVENOUS

## 2022-03-09 MED ORDER — INSULIN ASPART 100 UNIT/ML IJ SOLN
0.0000 [IU] | INTRAMUSCULAR | Status: DC | PRN
  Administered 2022-03-09: 2 [IU] via SUBCUTANEOUS

## 2022-03-09 MED ORDER — ONDANSETRON HCL 4 MG/2ML IJ SOLN
INTRAMUSCULAR | Status: DC | PRN
  Administered 2022-03-09: 4 mg via INTRAVENOUS

## 2022-03-09 MED ORDER — INSULIN GLARGINE-YFGN 100 UNIT/ML ~~LOC~~ SOLN
8.0000 [IU] | SUBCUTANEOUS | Status: DC
  Administered 2022-03-10 – 2022-03-15 (×6): 8 [IU] via SUBCUTANEOUS
  Filled 2022-03-09 (×7): qty 0.08

## 2022-03-09 MED ORDER — KETOROLAC TROMETHAMINE 15 MG/ML IJ SOLN
INTRAMUSCULAR | Status: AC
  Filled 2022-03-09: qty 1

## 2022-03-09 MED ORDER — ASPIRIN 81 MG PO TBEC
81.0000 mg | DELAYED_RELEASE_TABLET | Freq: Every day | ORAL | Status: DC
  Administered 2022-03-10 – 2022-03-15 (×6): 81 mg via ORAL
  Filled 2022-03-09 (×6): qty 1

## 2022-03-09 MED ORDER — CHLORHEXIDINE GLUCONATE 0.12 % MT SOLN
15.0000 mL | Freq: Once | OROMUCOSAL | Status: AC
  Administered 2022-03-09: 15 mL via OROMUCOSAL

## 2022-03-09 MED ORDER — INSULIN ASPART 100 UNIT/ML IJ SOLN
0.0000 [IU] | INTRAMUSCULAR | Status: DC
  Administered 2022-03-09: 3 [IU] via SUBCUTANEOUS
  Filled 2022-03-09: qty 0.15

## 2022-03-09 MED ORDER — INSULIN GLARGINE 100 UNIT/ML SOLOSTAR PEN: 18.0000 [IU] | PEN_INJECTOR | Freq: Every day | SUBCUTANEOUS | 11 refills | Status: DC

## 2022-03-09 MED ORDER — SODIUM CHLORIDE (PF) 0.9 % IJ SOLN
INTRAMUSCULAR | Status: AC
  Filled 2022-03-09: qty 50

## 2022-03-09 MED ORDER — ONDANSETRON HCL 4 MG PO TABS: 4.0000 mg | ORAL_TABLET | Freq: Four times a day (QID) | ORAL | Status: DC | PRN

## 2022-03-09 MED ORDER — PROPOFOL 10 MG/ML IV BOLUS
INTRAVENOUS | Status: DC | PRN
  Administered 2022-03-09: 200 mg via INTRAVENOUS

## 2022-03-09 MED ORDER — ACETAMINOPHEN 325 MG PO TABS
650.0000 mg | ORAL_TABLET | Freq: Four times a day (QID) | ORAL | Status: DC | PRN
  Administered 2022-03-09 – 2022-03-13 (×5): 650 mg via ORAL
  Filled 2022-03-09 (×6): qty 2

## 2022-03-09 MED ORDER — CHLORHEXIDINE GLUCONATE 4 % EX LIQD: 60.0000 mL | Freq: Once | CUTANEOUS | Status: DC

## 2022-03-09 MED ORDER — VANCOMYCIN HCL 1500 MG/300ML IV SOLN
1500.0000 mg | INTRAVENOUS | Status: DC
  Administered 2022-03-10 – 2022-03-13 (×4): 1500 mg via INTRAVENOUS
  Filled 2022-03-09 (×5): qty 300

## 2022-03-09 MED ORDER — FENTANYL CITRATE (PF) 250 MCG/5ML IJ SOLN
INTRAMUSCULAR | Status: DC | PRN
  Administered 2022-03-09: 50 ug via INTRAVENOUS
  Administered 2022-03-09 (×2): 25 ug via INTRAVENOUS
  Administered 2022-03-09: 50 ug via INTRAVENOUS

## 2022-03-09 MED ORDER — ONDANSETRON HCL 4 MG/2ML IJ SOLN: 4.0000 mg | Freq: Four times a day (QID) | INTRAMUSCULAR | Status: DC | PRN

## 2022-03-09 MED ORDER — BUPIVACAINE-EPINEPHRINE (PF) 0.25% -1:200000 IJ SOLN
INTRAMUSCULAR | Status: AC
  Filled 2022-03-09: qty 30

## 2022-03-09 MED ORDER — KETOROLAC TROMETHAMINE 15 MG/ML IJ SOLN
15.0000 mg | Freq: Once | INTRAMUSCULAR | Status: AC | PRN
  Administered 2022-03-09: 15 mg via INTRAVENOUS

## 2022-03-09 MED ORDER — FENTANYL CITRATE (PF) 250 MCG/5ML IJ SOLN
INTRAMUSCULAR | Status: AC
  Filled 2022-03-09: qty 5

## 2022-03-09 MED ORDER — ONDANSETRON HCL 4 MG/2ML IJ SOLN
INTRAMUSCULAR | Status: AC
  Filled 2022-03-09: qty 2

## 2022-03-09 MED ORDER — FENTANYL CITRATE (PF) 100 MCG/2ML IJ SOLN
INTRAMUSCULAR | Status: AC
  Filled 2022-03-09: qty 2

## 2022-03-09 MED ORDER — AMISULPRIDE (ANTIEMETIC) 5 MG/2ML IV SOLN: 10.0000 mg | Freq: Once | INTRAVENOUS | Status: DC | PRN

## 2022-03-09 MED ORDER — MIDAZOLAM HCL 2 MG/2ML IJ SOLN
INTRAMUSCULAR | Status: AC
  Filled 2022-03-09: qty 2

## 2022-03-09 MED ORDER — BUPIVACAINE HCL (PF) 0.25 % IJ SOLN
INTRAMUSCULAR | Status: AC
  Filled 2022-03-09: qty 30

## 2022-03-09 SURGICAL SUPPLY — 36 items
APL PRP STRL LF DISP 70% ISPRP (MISCELLANEOUS) ×1
BAG COUNTER SPONGE SURGICOUNT (BAG) ×1 IMPLANT
BAG SPNG CNTER NS LX DISP (BAG) ×1
BNDG CMPR 9X4 STRL LF SNTH (GAUZE/BANDAGES/DRESSINGS) ×1
BNDG ELASTIC 3X5.8 VLCR STR LF (GAUZE/BANDAGES/DRESSINGS) IMPLANT
BNDG ELASTIC 4X5.8 VLCR STR LF (GAUZE/BANDAGES/DRESSINGS) ×1 IMPLANT
BNDG ESMARK 4X9 LF (GAUZE/BANDAGES/DRESSINGS) IMPLANT
BNDG GAUZE DERMACEA FLUFF 4 (GAUZE/BANDAGES/DRESSINGS) ×2 IMPLANT
BNDG GZE DERMACEA 4 6PLY (GAUZE/BANDAGES/DRESSINGS) ×1
CHLORAPREP W/TINT 26 (MISCELLANEOUS) ×1 IMPLANT
CORD BIPOLAR FORCEPS 12FT (ELECTRODE) ×1 IMPLANT
COVER MAYO STAND STRL (DRAPES) ×1 IMPLANT
COVER SURGICAL LIGHT HANDLE (MISCELLANEOUS) ×1 IMPLANT
CUFF TOURN SGL QUICK 18X4 (TOURNIQUET CUFF) ×1 IMPLANT
DRAIN PENROSE 18X1/4 LTX STRL (DRAIN) IMPLANT
DRAPE HALF SHEET 40X57 (DRAPES) ×1 IMPLANT
DRAPE SURG 17X23 STRL (DRAPES) ×1 IMPLANT
GAUZE XEROFORM 1X8 LF (GAUZE/BANDAGES/DRESSINGS) ×1 IMPLANT
GLOVE BIO SURGEON STRL SZ7 (GLOVE) ×1 IMPLANT
GLOVE SURG UNDER POLY LF SZ7 (GLOVE) ×1 IMPLANT
GOWN STRL REUS W/ TWL XL LVL3 (GOWN DISPOSABLE) ×2 IMPLANT
GOWN STRL REUS W/TWL XL LVL3 (GOWN DISPOSABLE) ×2
KIT BASIN OR (CUSTOM PROCEDURE TRAY) ×1 IMPLANT
KIT TURNOVER KIT B (KITS) ×1 IMPLANT
NDL HYPO 25GX1X1/2 BEV (NEEDLE) IMPLANT
NEEDLE HYPO 25GX1X1/2 BEV (NEEDLE) ×1 IMPLANT
NS IRRIG 1000ML POUR BTL (IV SOLUTION) ×1 IMPLANT
PACK ORTHO EXTREMITY (CUSTOM PROCEDURE TRAY) ×1 IMPLANT
PAD ARMBOARD 7.5X6 YLW CONV (MISCELLANEOUS) ×1 IMPLANT
SWAB CULTURE ESWAB REG 1ML (MISCELLANEOUS) IMPLANT
SYR CONTROL 10ML LL (SYRINGE) IMPLANT
TOWEL GREEN STERILE (TOWEL DISPOSABLE) ×1 IMPLANT
TUBE CONNECTING 12X1/4 (SUCTIONS) IMPLANT
UNDERPAD 30X36 HEAVY ABSORB (UNDERPADS AND DIAPERS) ×1 IMPLANT
WATER STERILE IRR 1000ML POUR (IV SOLUTION) ×1 IMPLANT
YANKAUER SUCT BULB TIP NO VENT (SUCTIONS) IMPLANT

## 2022-03-09 NOTE — Inpatient Diabetes Management (Signed)
Inpatient Diabetes Program Recommendations  AACE/ADA: New Consensus Statement on Inpatient Glycemic Control   Target Ranges:  Prepandial:   less than 140 mg/dL      Peak postprandial:   less than 180 mg/dL (1-2 hours)      Critically ill patients:  140 - 180 mg/dL    Latest Reference Range & Units 03/09/22 09:23  Glucose 70 - 99 mg/dL 240 (H)    Latest Reference Range & Units 03/09/22 09:17  Glucose-Capillary 70 - 99 mg/dL 252 (H)    Latest Reference Range & Units 02/28/22 02:10  Glutamic Acid Decarb Ab 0.0 - 5.0 U/mL <5.0  C-Peptide 1.1 - 4.4 ng/mL 1.2    Latest Reference Range & Units 02/20/15 11:23 02/26/22 11:43 02/26/22 17:57  Hemoglobin A1C 4.8 - 5.6 % 6.2 11.8 (H) 11.8 (H)   Review of Glycemic Control  Diabetes history: DM2 (dx during hospital admission 02/26/22-03/01/22) Outpatient Diabetes medications: Lantus 18 units daily, Humalog 3 units TID with meals; FreeStyle Libre3 Current orders for Inpatient glycemic control: None; in ED  Inpatient Diabetes Program Recommendations:    Insulin: If patient is admitted, please consider ordering Semglee 15 units Q24H, CBGs AC&HS, Novolog 0-9 units TID with meals, Novolog 0-5 units QHS, and Novolog 3 units TID with meals for meal coverage if patient eats at least 50% of meals.  NOTE: Patient in ED; sent from PCP office due to cellulitis of right hand. Patient was recently inpatient 02/26/22-03/01/22 with DKA and AKI; was newly dx with DM during that admission. Inpatient diabetes coordinator spoke with patient on 02/27/22, 02/28/22, and 03/01/22 during that admission. Will follow along if admitted.  Thanks, Barnie Alderman, RN, MSN, [place] Diabetes Coordinator Inpatient Diabetes Program [phone] (Team Pager from 8am to [place])

## 2022-03-09 NOTE — Assessment & Plan Note (Signed)
Recent onset with hospn for DKA - will need to monitor closely for any recurrence as inpatient  Lab Results  Component Value Date   HGBA1C 11.8 (H) 02/26/2022

## 2022-03-09 NOTE — Consult Note (Addendum)
Reason for Consult:Right hand infection Referring Physician: Berle Mull Time called: [phone] Time at bedside: [name] is an 66 y.o. male.  HPI: [facility]eda began to have right long finger pain and swelling beginning Thursday of last week. It continued to worsen over the weekend and yesterday. He went and saw his PCP who directed him to the ED and hand surgery was consulted. He denies any antecedent event, prior e/o, fevers, chills, sweats, or N/V. 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  Past Medical History:  Diagnosis Date   Depression    Diabetes mellitus without complication ([place])    HIV infection ([place])    Tobacco use     Past Surgical History:  Procedure Laterality Date   None      Family History  Problem Relation Age of Onset   Hypertension Mother    Healthy Sister    Healthy Brother    Diabetes type I Daughter     Social History:  reports that he has quit smoking. His smoking use included cigarettes. He has a 23.00 pack-year smoking history. He has never used smokeless tobacco. He reports current alcohol use of about 6.0 standard drinks of alcohol per week. He reports that he does not use drugs.  Allergies: No Known Allergies  Medications: I have reviewed the patient's current medications.  Results for orders placed or performed during the hospital encounter of 03/09/22 (from the past 48 hour(s))  CBG monitoring, ED     Status: Abnormal   Collection Time: 03/09/22  9:17 AM  Result Value Ref Range   Glucose-Capillary 252 (H) 70 - 99 mg/dL    Comment: Glucose reference range applies only to samples taken after fasting for at least 8 hours.  CBC with Differential     Status: Abnormal   Collection Time: 03/09/22  9:23 AM  Result Value Ref Range   WBC 19.4 (H) 4.0 - 10.5 K/uL   RBC 4.71 4.22 - 5.81 MIL/uL   Hemoglobin 12.7 (L) 13.0 - 17.0 g/dL   HCT 36.6 (L) 39.0 - 52.0 %   MCV 77.7 (L) 80.0 - 100.0 fL   MCH 27.0 26.0 - 34.0 pg   MCHC 34.7 30.0 - 36.0  g/dL   RDW 13.5 11.5 - 15.5 %   Platelets 313 150 - 400 K/uL   nRBC 0.0 0.0 - 0.2 %   Neutrophils Relative % 82 %   Neutro Abs 15.9 (H) 1.7 - 7.7 K/uL   Lymphocytes Relative 10 %   Lymphs Abs 1.9 0.7 - 4.0 K/uL   Monocytes Relative 7 %   Monocytes Absolute 1.4 (H) 0.1 - 1.0 K/uL   Eosinophils Relative 0 %   Eosinophils Absolute 0.0 0.0 - 0.5 K/uL   Basophils Relative 0 %   Basophils Absolute 0.1 0.0 - 0.1 K/uL   Immature Granulocytes 1 %   Abs Immature Granulocytes 0.10 (H) 0.00 - 0.07 K/uL    Comment: Performed at [facility], [place] [address] [place], [place] [address]  Basic metabolic panel     Status: Abnormal   Collection Time: 03/09/22  9:23 AM  Result Value Ref Range   Sodium 133 (L) 135 - 145 mmol/L   Potassium 3.8 3.5 - 5.1 mmol/L   Chloride 100 98 - 111 mmol/L   CO2 24 22 - 32 mmol/L   Glucose, Bld 240 (H) 70 - 99 mg/dL    Comment: Glucose reference range applies only to samples taken  after fasting for at least 8 hours.   BUN 13 8 - 23 mg/dL   Creatinine, Ser 1.02 0.61 - 1.24 mg/dL   Calcium 8.7 (L) 8.9 - 10.3 mg/dL   GFR, Estimated >60 >60 mL/min    Comment: (NOTE) Calculated using the CKD-EPI Creatinine Equation (2021)    Anion gap 9 5 - 15    Comment: Performed at [facility], [place] [address] [place], [place] [address]  CBG monitoring, ED     Status: Abnormal   Collection Time: 03/09/22  1:42 PM  Result Value Ref Range   Glucose-Capillary 173 (H) 70 - 99 mg/dL    Comment: Glucose reference range applies only to samples taken after fasting for at least 8 hours.    CT HAND RIGHT W CONTRAST  Result Date: 03/09/2022 CLINICAL DATA:  Soft tissue infection suspected, hand, xray done EXAM: CT OF THE UPPER RIGHT EXTREMITY WITH CONTRAST TECHNIQUE: Multidetector CT imaging of the upper right extremity was performed according to the standard protocol following intravenous contrast administration. RADIATION DOSE REDUCTION: This exam  was performed according to the departmental dose-optimization program which includes automated exposure control, adjustment of the mA and/or kV according to patient size and/or use of iterative reconstruction technique. CONTRAST:  145m OMNIPAQUE IOHEXOL 300 MG/ML  SOLN COMPARISON:  Hand radiograph 03/09/2022 FINDINGS: Bones/Joint/Cartilage There is a chronic posttraumatic deformity of the fifth metacarpal. There is no evidence of acute fracture. Alignment is normal. 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 No bone erosion or frank bony destruction. No obvious joint effusion. Ligaments Suboptimally assessed by CT. Muscles and Tendons No acute myotendinous abnormality by CT. There is no obvious tenosynovitis. Soft tissues Diffuse soft tissue swelling of the hand extending into the fingers, most prominently into the middle finger. No soft tissue gas. No focal fluid collection. IMPRESSION: Extensive soft tissue swelling of the hand extending into the fingers, most prominently the middle finger, as can be seen in cellulitis. No evidence of soft tissue abscess, obvious tenosynovitis, or osseous changes to suggest osteomyelitis by CT. Electronically Signed   By: JMaurine SimmeringM.D.   On: 03/09/2022 10:36   DG Hand Complete Right  Result Date: 03/09/2022 CLINICAL DATA:  Cellulitis. Inflammation to the right hand and fingers. Swollen and hot to touch. EXAM: RIGHT HAND - COMPLETE 3+ VIEW COMPARISON:  None Available. FINDINGS: There is no evidence of fracture or dislocation. No cortical erosion or periosteal reaction. Contour irregularity of the fifth metacarpal, likely sequela of prior trauma. No significant arthropathy. Marked soft tissue swelling prominent about the dorsum of the hand. IMPRESSION: Marked soft tissue swelling prominent about the dorsum of the hand. No acute fracture or dislocation. No radiographic evidence of osteomyelitis. Electronically Signed   By: IKeane PoliceD.O.   On: 03/09/2022 10:00    Review of  Systems  HENT:  Negative for ear discharge, ear pain, hearing loss and tinnitus.   Eyes:  Negative for photophobia and pain.  Respiratory:  Negative for cough and shortness of breath.   Cardiovascular:  Negative for chest pain.  Gastrointestinal:  Negative for abdominal pain, nausea and vomiting.  Genitourinary:  Negative for dysuria, flank pain, frequency and urgency.  Musculoskeletal:  Positive for arthralgias (Right hand). Negative for back pain, myalgias and neck pain.  Neurological:  Negative for dizziness and headaches.  Hematological:  Does not bruise/bleed easily.  Psychiatric/Behavioral:  The patient is not nervous/anxious.    Blood pressure (!) 120/92, pulse (!) 103, temperature 99.7 F (  37.6 C), temperature source Oral, resp. rate 20, height '5\' 9"'$  (1.753 m), weight 71.2 kg, SpO2 96 %. Physical Exam Constitutional:      General: He is not in acute distress.    Appearance: He is well-developed. He is not diaphoretic.  HENT:     Head: Normocephalic and atraumatic.  Eyes:     General: No scleral icterus.       Right eye: No discharge.        Left eye: No discharge.     Conjunctiva/sclera: Conjunctivae normal.  Cardiovascular:     Rate and Rhythm: Normal rate and regular rhythm.  Pulmonary:     Effort: Pulmonary effort is normal. No respiratory distress.  Musculoskeletal:     Cervical back: Normal range of motion.     Comments: Right shoulder, elbow, wrist, digits- no skin wounds, mod TTP hand, severe TTP volar fingers/hand, fusiform edema long finger, generalized erythema with some streaking towards elbow, no instability, no blocks to motion  Sens  Ax/R/M/U intact  Mot   Ax/ R/ PIN/ M/ AIN/ U intact  Rad 2+  Skin:    General: Skin is warm and dry.  Neurological:     Mental Status: He is alert.  Psychiatric:        Mood and Affect: Mood normal.        Behavior: Behavior normal.     Assessment/Plan: Right hand infection -- Plan I&D today by Dr. Tempie Donning. Will need  several days of IV abx and possible repeat I&D depending on response. Please keep NPO. Multiple medical problems including DM and HIV -- per primary service   Lisette Abu, PA-C Orthopedic Surgery [phone] 03/09/2022, 2:33 PM   Patient seen and examined.  Diffuse dorsal hand swelling with focal fluctuance over dorsal aspect of middle finger.  Reviewed CT scan which demonstrates diffuse soft tissue swelling without abscess.  Will plan for I&D of the hand today and will leave penrose drain.  Will take intraoperative cultures.  Patient to be admitted to the hospitalist for IV abx and BG management.  Additional plan to follow in postop note.  Sherilyn Cooter, M.D. [facility]

## 2022-03-09 NOTE — Patient Instructions (Signed)
You had the pain shot today  Please go to [facility] now

## 2022-03-09 NOTE — Assessment & Plan Note (Signed)
BP Readings from Last 3 Encounters:  03/09/22 136/78  03/05/22 120/76  03/01/22 113/78   Stable, pt to continue medical treatment norvasc 10 mg qd

## 2022-03-09 NOTE — ED Provider Notes (Signed)
[facility] DEPT Provider Note   CSN: [id number] Arrival date & time: 03/09/22  0850     History  Chief Complaint  Patient presents with   Cellulitus of finger    Ronald Castaneda is a 66 y.o. male.  66 year old male with prior medical history as detailed below presents for evaluation.  Patient complains of right hand pain and swelling.  Patient reports that he noticed a small spot on the dorsal aspect of his right third finger.  This area of inflammation was first noticed on Friday.  Over the weekend increased pain and redness was noted around the right third finger extending into the dorsal aspect of the right hand.  He noticed significant increase in pain and swelling over the last 24 hours.  Patient was seen by PCP this morning referred to the ED for IV antibiotics and likely debridement.  Patient with known history of HIV and diabetes.  Patient is right-handed.  Patient denies other complaint.  He specifically denies fever, chills, rigors, nausea, vomiting, etc.  The history is provided by the patient and medical records.       Home Medications Prior to Admission medications   Medication Sig Start Date End Date Taking? Authorizing Provider  albuterol (VENTOLIN HFA) 108 (90 Base) MCG/ACT inhaler Inhale 2 puffs into the lungs every 6 (six) hours as needed for wheezing or shortness of breath. 02/08/22   Hoyt Koch, MD  amLODipine (NORVASC) 10 MG tablet Take 1 tablet (10 mg total) by mouth daily. 01/22/22   Hoyt Koch, MD  aspirin 81 MG chewable tablet Chew 1 tablet (81 mg total) by mouth daily. 04/22/21   Thurnell Lose, MD  blood glucose meter kit and supplies KIT Dispense based on patient and insurance preference. Use up to four times daily as directed. 02/28/22   Elodia Florence., MD  blood glucose meter kit and supplies KIT Use up to four times daily as directed. 03/01/22   Elodia Florence., MD  Continuous Blood  Gluc Sensor (FREESTYLE LIBRE 3 SENSOR) MISC Place 1 sensor on the skin every 14 days. Use to check glucose continuously 03/05/22   Hoyt Koch, MD  cyclobenzaprine (FLEXERIL) 5 MG tablet Take 1 tablet (5 mg total) by mouth 3 (three) times daily as needed for muscle spasms. 01/22/22   Hoyt Koch, MD  Darunavir-Cobicistat-Emtricitabine-Tenofovir Alafenamide (SYMTUZA) 800-150-200-10 MG TABS TAKE 1 TABLET BY MOUTH DAILY WITH BREAKFAST. 10/14/21 10/14/22  Michel Bickers, MD  dolutegravir (TIVICAY) 50 MG tablet TAKE 1 TABLET (50 MG TOTAL) BY MOUTH DAILY. Patient taking differently: Take 50 mg by mouth daily. 10/14/21 10/14/22  Michel Bickers, MD  Fingerstix Lancets MISC Use as needed to check blood sugars up to 4 times daily 03/01/22   Elodia Florence., MD  fluconazole (DIFLUCAN) 150 MG tablet Take 1 tablet (150 mg total) by mouth every 3 (three) days. 03/05/22   Hoyt Koch, MD  glucose blood test strip use as needed to check blood sugars up to 4 times daily 03/01/22   Elodia Florence., MD  insulin glargine (LANTUS) 100 UNIT/ML Solostar Pen Inject 18 Units into the skin daily. 03/09/22   Biagio Borg, MD  insulin lispro (HUMALOG) 100 UNIT/ML KwikPen Inject 3 Units into the skin 3 (three) times daily with meals. 03/09/22   Biagio Borg, MD  Insulin Pen Needle 32G X 4 MM MISC use as directed 03/01/22   Elodia Florence.,  MD  meloxicam (MOBIC) 15 MG tablet Take 1 tablet (15 mg total) by mouth daily. 01/22/22   Hoyt Koch, MD  oxymetazoline (AFRIN) 0.05 % nasal spray Place 1 spray into both nostrils 2 (two) times daily as needed for congestion.    [provider]  rosuvastatin (CRESTOR) 10 MG tablet Take 1 tablet (10 mg total) by mouth daily. 01/07/22   Hoyt Koch, MD  sildenafil (VIAGRA) 100 MG tablet Take 1 tablet (100 mg total) by mouth daily as needed for erectile dysfunction. 02/08/22   Hoyt Koch, MD  insulin aspart (NOVOLOG)  100 UNIT/ML FlexPen Inject 3 Units into the skin 3 (three) times daily with meals. 02/28/22 03/01/22  Elodia Florence., MD      Allergies    Patient has no known allergies.    Review of Systems   Review of Systems  All other systems reviewed and are negative.   Physical Exam Updated Vital Signs BP 128/78 (BP Location: Left Arm)   Pulse (!) 103   Temp 98.7 F (37.1 C) (Oral)   Resp 18   Ht $R'5\' 9"'ts$  (1.753 m)   Wt 71.2 kg   SpO2 96%   BMI 23.18 kg/m  Physical Exam Vitals and nursing note reviewed.  Constitutional:      General: He is not in acute distress.    Appearance: Normal appearance. He is well-developed.  HENT:     Head: Normocephalic and atraumatic.  Eyes:     Conjunctiva/sclera: Conjunctivae normal.     Pupils: Pupils are equal, round, and reactive to light.  Cardiovascular:     Rate and Rhythm: Normal rate and regular rhythm.     Heart sounds: Normal heart sounds.  Pulmonary:     Effort: Pulmonary effort is normal. No respiratory distress.     Breath sounds: Normal breath sounds.  Abdominal:     General: There is no distension.     Palpations: Abdomen is soft.     Tenderness: There is no abdominal tenderness.  Musculoskeletal:        General: Swelling and tenderness present. No deformity.     Cervical back: Normal range of motion and neck supple.     Comments: See images of right hand below.  Noted erythema and edema surrounding the proximal aspect of the right third finger with erythema extending into the dorsal aspect of the right hand and with streaking erythema progressing up the right forearm to the antecubital fossa.  Range of motion of the right third finger is significantly decreased secondary to pain.  Skin:    General: Skin is warm and dry.  Neurological:     General: No focal deficit present.     Mental Status: He is alert and oriented to person, place, and time.          ED Results / Procedures / Treatments   Labs (all labs  ordered are listed, but only abnormal results are displayed) Labs Reviewed  CULTURE, BLOOD (ROUTINE X 2)  CULTURE, BLOOD (ROUTINE X 2)  CBC WITH DIFFERENTIAL/PLATELET  BASIC METABOLIC PANEL  CBG MONITORING, ED    EKG None  Radiology No results found.  Procedures Procedures    Medications Ordered in ED Medications - No data to display  ED Course/ Medical Decision Making/ A&P                           Medical Decision Making Amount and/or  Complexity of Data Reviewed Labs: ordered. Radiology: ordered.  Risk Prescription drug management. Decision regarding hospitalization.    Medical Screen Complete  This patient presented to the ED with complaint of right hand cellulitis.  This complaint involves an extensive number of treatment options. The initial differential diagnosis includes, but is not limited to, right hand cellulitis possible soft tissue infection, metabolic abnormality, etc.  This presentation is: Acute, Self-Limited, Previously Undiagnosed, Uncertain Prognosis, Complicated, Systemic Symptoms, and Threat to Life/Bodily Function  Patient is presenting with complaint of painful infection to the right hand.  Exam and history are consistent with likely cellulitis and/or possible deep space infection of the right middle finger and right hand.  Broad-spectrum antibiotics administered here in the ED.   Case discussed with hand service.  Hilbert Odor, advised that patient would need to be at [facility] for likely operative debridement with Dr. Tempie Donning at 5 PM.  Patient to be admitted to the hospitalist service for placement at [facility].  Dr. Posey Pronto covering hospitalist service is aware of case and will evaluate for admission.   Co morbidities that complicated the patient's evaluation  History of HIV, history of diabetes   Additional history obtained: External records from outside sources obtained and reviewed including prior ED visits and prior Inpatient  records.    Lab Tests:  I ordered and personally interpreted labs.  The pertinent results include: CBC, BMP, CBG   Imaging Studies ordered:  I ordered imaging studies including right hand I independently visualized and interpreted obtained imaging which showed no fracture or obvious foreign body  I agree with the radiologist interpretation.   Cardiac Monitoring:  The patient was maintained on a cardiac monitor.  I personally viewed and interpreted the cardiac monitor which showed an underlying rhythm of: NSR   Medicines ordered:  I ordered medication including antibiotics for infection Reevaluation of the patient after these medicines showed that the patient: stayed the same   Problem List / ED Course:  Right hand cellulitis/infection   Reevaluation:  After the interventions noted above, I reevaluated the patient and found that they have: improved  Disposition:  After consideration of the diagnostic results and the patients response to treatment, I feel that the patent would benefit from admission.          Final Clinical Impression(s) / ED Diagnoses Final diagnoses:  Cellulitis of hand, right    Rx / DC Orders ED Discharge Orders     None         Valarie Merino, MD 03/09/22 1030

## 2022-03-09 NOTE — Progress Notes (Signed)
Patient ID: Ronald Castaneda, male   DOB: [birth year], 66 y.o.   MRN: [id number]        Chief Complaint: right hand pain and swelling with oozing       HPI:  Ronald Castaneda is a 66 y.o. male here with c/o probable bug bite to the third finger right hand, posteriorly just proximal to the PIP, that just seemed to rapidly get worse over the last 24 hr, now with severe, pain, swelling of whole hand and oozing from the original bug bite site.  Has some sense of swelling and discomfort to the elbow but no redness or swelling yet.  Denies fever, chills.  Has hx of HIV infeciton, DM with recent hospn for DKA at [place].  No known trauma.         Wt Readings from Last 3 Encounters:  03/09/22 157 lb (71.2 kg)  03/05/22 158 lb (71.7 kg)  02/26/22 148 lb 3.2 oz (67.2 kg)   BP Readings from Last 3 Encounters:  03/09/22 136/78  03/05/22 120/76  03/01/22 113/78         Past Medical History:  Diagnosis Date   Depression    Diabetes mellitus without complication ([place])    HIV infection ([place])    Tobacco use    Past Surgical History:  Procedure Laterality Date   None      reports that he has quit smoking. His smoking use included cigarettes. He has a 23.00 pack-year smoking history. He has never used smokeless tobacco. He reports current alcohol use of about 6.0 standard drinks of alcohol per week. He reports that he does not use drugs. family history includes Diabetes type I in his daughter; Healthy in his brother and sister; Hypertension in his mother. Not on File Current Outpatient Medications on File Prior to Visit  Medication Sig Dispense Refill   albuterol (VENTOLIN HFA) 108 (90 Base) MCG/ACT inhaler Inhale 2 puffs into the lungs every 6 (six) hours as needed for wheezing or shortness of breath. 8 g 2   amLODipine (NORVASC) 10 MG tablet Take 1 tablet (10 mg total) by mouth daily. 90 tablet 3   aspirin 81 MG chewable tablet Chew 1 tablet (81 mg total) by mouth daily. 30 tablet 0   blood glucose  meter kit and supplies KIT Dispense based on patient and insurance preference. Use up to four times daily as directed. 1 each 0   blood glucose meter kit and supplies KIT Use up to four times daily as directed. 1 each 0   Continuous Blood Gluc Sensor (FREESTYLE LIBRE 3 SENSOR) MISC Place 1 sensor on the skin every 14 days. Use to check glucose continuously 2 each 11   cyclobenzaprine (FLEXERIL) 5 MG tablet Take 1 tablet (5 mg total) by mouth 3 (three) times daily as needed for muscle spasms. 60 tablet 1   Darunavir-Cobicistat-Emtricitabine-Tenofovir Alafenamide (SYMTUZA) 800-150-200-10 MG TABS TAKE 1 TABLET BY MOUTH DAILY WITH BREAKFAST. 30 tablet 11   dolutegravir (TIVICAY) 50 MG tablet TAKE 1 TABLET (50 MG TOTAL) BY MOUTH DAILY. (Patient taking differently: Take 50 mg by mouth daily.) 30 tablet 11   Fingerstix Lancets MISC Use as needed to check blood sugars up to 4 times daily 100 each 0   fluconazole (DIFLUCAN) 150 MG tablet Take 1 tablet (150 mg total) by mouth every 3 (three) days. 2 tablet 0   glucose blood test strip use as needed to check blood sugars up to 4 times daily 100 each 0  insulin glargine (LANTUS) 100 UNIT/ML Solostar Pen Inject 18 Units into the skin daily. 15 mL 11   insulin lispro (HUMALOG) 100 UNIT/ML KwikPen Inject 3 Units into the skin 3 (three) times daily with meals. 15 mL 11   Insulin Pen Needle 32G X 4 MM MISC use as directed 100 each 0   meloxicam (MOBIC) 15 MG tablet Take 1 tablet (15 mg total) by mouth daily. 30 tablet 0   oxymetazoline (AFRIN) 0.05 % nasal spray Place 1 spray into both nostrils 2 (two) times daily as needed for congestion.     rosuvastatin (CRESTOR) 10 MG tablet Take 1 tablet (10 mg total) by mouth daily. 90 tablet 3   sildenafil (VIAGRA) 100 MG tablet Take 1 tablet (100 mg total) by mouth daily as needed for erectile dysfunction. 10 tablet 5   [DISCONTINUED] insulin aspart (NOVOLOG) 100 UNIT/ML FlexPen Inject 3 Units into the skin 3 (three) times  daily with meals. 15 mL 2   No current facility-administered medications on file prior to visit.        ROS:  All others reviewed and negative.  Objective        PE:  BP 136/78 (BP Location: Right Arm, Patient Position: Sitting, Cuff Size: Large)   Pulse (!) 101   Temp 98 F (36.7 C) (Other (Comment))   Ht _0  (1.753 m)   Wt 157 lb (71.2 kg)   SpO2 93%   BMI 23.18 kg/m                 Constitutional: Pt appears in NAD               HENT: Head: NCAT.                Right Ear: External ear normal.                 Left Ear: External ear normal.                Eyes: . Pupils are equal, round, and reactive to light. Conjunctivae and EOM are normal               Nose: without d/c or deformity               Neck: Neck supple. Gross normal ROM               Cardiovascular: Normal rate and regular rhythm.                 Pulmonary/Chest: Effort normal and breath sounds without rales or wheezing.                              Neurological: Pt is alert. At baseline orientation, motor grossly intact               Skin: right hand with 2-3+ red, tender swelling whole hand but worst at the third finger posteriorly with drainage from a site just proximal to the PIP, o/w neurovasc intact               Psychiatric: Pt behavior is normal without agitation   Micro: none  Cardiac tracings I have personally interpreted today:  none  Pertinent Radiological findings (summarize): none   Lab Results  Component Value Date   WBC 10.4 03/01/2022   HGB 13.5 03/01/2022   HCT 38.3 (L) 03/01/2022   PLT 245 03/01/2022  GLUCOSE 145 (H) 03/01/2022   CHOL 219 (H) 10/14/2021   TRIG 193 (H) 10/14/2021   HDL 46 10/14/2021   LDLCALC 140 (H) 10/14/2021   ALT 22 03/01/2022   AST 18 03/01/2022   NA 138 03/01/2022   K 3.4 (L) 03/01/2022   CL 103 03/01/2022   CREATININE 1.06 03/01/2022   BUN 10 03/01/2022   CO2 26 03/01/2022   TSH 1.76 02/26/2022   HGBA1C 11.8 (H) 02/26/2022   Assessment/Plan:   Ronald Castaneda is a 66 y.o. Black or African American [2] male with  has a past medical history of Depression, Diabetes mellitus without complication ([place]), HIV infection ([place]), and Tobacco use.  Cellulitis of right hand Rapidly progressing very severe, with possible abscess or tendinosis I suspect - I gave toradodl 30 mg IM x 1 for pain, but advised to go to [facility] ED now for further imaging, IV antibx and possible I and D.    Benign hypertension BP Readings from Last 3 Encounters:  03/09/22 136/78  03/05/22 120/76  03/01/22 113/78   Stable, pt to continue medical treatment norvasc 10 mg qd   Newly diagnosed diabetes ([place]) Recent onset with hospn for DKA - will need to monitor closely for any recurrence as inpatient  Lab Results  Component Value Date   HGBA1C 11.8 (H) 02/26/2022   Followup: Return if symptoms worsen or fail to improve.  Cathlean Cower, MD 03/09/2022 8:25 AM [facility]

## 2022-03-09 NOTE — Interval H&P Note (Signed)
History and Physical Interval Note:  03/09/2022 5:22 PM  Ronald Castaneda  has presented today for surgery, with the diagnosis of CELLULITIS OF FINGER.  The various methods of treatment have been discussed with the patient and family. After consideration of risks, benefits and other options for treatment, the patient has consented to  Procedure(s): IRRIGATION AND DEBRIDEMENT OF HAND (Right) as a surgical intervention.  The patient's history has been reviewed, patient examined, no change in status, stable for surgery.  I have reviewed the patient's chart and labs.  Questions were answered to the patient's satisfaction.     [name] Shuaib Corsino

## 2022-03-09 NOTE — Transfer of Care (Signed)
Immediate Anesthesia Transfer of Care Note  Patient: Ronald Castaneda  Procedure(s) Performed: IRRIGATION AND DEBRIDEMENT OF HAND (Right: Hand)  Patient Location: PACU  Anesthesia Type:General  Level of Consciousness: drowsy  Airway & Oxygen Therapy: Patient Spontanous Breathing  Post-op Assessment: Report given to RN and Post -op Vital signs reviewed and stable  Post vital signs: Reviewed and stable  Last Vitals:  Vitals Value Taken Time  BP 152/90 03/09/22 1845  Temp    Pulse 109 03/09/22 1847  Resp 18 03/09/22 1847  SpO2 100 % 03/09/22 1847  Vitals shown include unvalidated device data.  Last Pain:  Vitals:   03/09/22 1432  TempSrc:   PainSc: 8       Patients Stated Pain Goal: 0 (76/22/63 3354)  Complications: No notable events documented.

## 2022-03-09 NOTE — Progress Notes (Signed)
Pharmacy Antibiotic Note  Ronald Castaneda is a 66 y.o. male admitted on 03/09/2022 with cellulitis.  Pharmacy has been consulted for vancomycin, cefepime dosing.  Plan: Cefepime 2g IV q8h Vancomycin 1500 mg IV q24h  (SCr 1, est AUC 452) Measure Vanc levels as needed.  Goal AUC = 400 - 550 Follow up renal function, culture results, and clinical course.   Height: '5\' 9"'$  (175.3 cm) Weight: 71.2 kg (157 lb) IBW/kg (Calculated) : 70.7  Temp (24hrs), Avg:98.4 F (36.9 C), Min:98 F (36.7 C), Max:98.7 F (37.1 C)  Recent Labs  Lab 03/09/22 0923  WBC 19.4*  CREATININE 1.02    Estimated Creatinine Clearance: 71.2 mL/min (by C-G formula based on SCr of 1.02 mg/dL).    No Known Allergies  Antimicrobials this admission: 10/17 Vancomycin >>  10/17 Cefepime >>   Dose adjustments this admission:   Microbiology results: 10/17 BCx:    Thank you for allowing pharmacy to be a part of this patient's care.  Gretta Arab PharmD, BCPS [facility] main pharmacy [phone] 03/09/2022 1:21 PM

## 2022-03-09 NOTE — ED Triage Notes (Signed)
Presents from PCP with inflammation to rt hand and finger, red, swollen and hot to touch.

## 2022-03-09 NOTE — Progress Notes (Signed)
A consult was received from an ED physician for vancomycin & cefepime per pharmacy dosing.  The patient's profile has been reviewed for ht/wt/allergies/indication/available labs.    A one time order has been placed for vancomycin 1500 mg & cefepime 2 gm.   Further antibiotics/pharmacy consults should be ordered by admitting physician if indicated.                       Thank you,  Eudelia Bunch, Pharm.D 03/09/2022 9:38 AM

## 2022-03-09 NOTE — Anesthesia Postprocedure Evaluation (Signed)
Anesthesia Post Note  Patient: Ronald Castaneda  Procedure(s) Performed: IRRIGATION AND DEBRIDEMENT OF HAND (Right: Hand)     Patient location during evaluation: PACU Anesthesia Type: General Level of consciousness: awake and alert Pain management: pain level controlled Vital Signs Assessment: post-procedure vital signs reviewed and stable Respiratory status: spontaneous breathing, nonlabored ventilation, respiratory function stable and patient connected to nasal cannula oxygen Cardiovascular status: stable and blood pressure returned to baseline Anesthetic complications: no   No notable events documented.  Last Vitals:  Vitals:   03/09/22 1945 03/09/22 2013  BP: 135/88 135/89  Pulse: 90 93  Resp: 16 17  Temp: 37.2 C 37.3 C  SpO2: 91% 97%    Last Pain:  Vitals:   03/09/22 2015  TempSrc:   PainSc: [place]

## 2022-03-09 NOTE — H&P (View-Only) (Signed)
Reason for Consult:Right hand infection Referring Physician: Berle Mull Time called: [phone] Time at bedside: [name] is an 66 y.o. male.  HPI: Ronald Castaneda began to have right long finger pain and swelling beginning Thursday of last week. It continued to worsen over the weekend and yesterday. He went and saw his PCP who directed him to the ED and hand surgery was consulted. He denies any antecedent event, prior e/o, fevers, chills, sweats, or N/V. 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  Past Medical History:  Diagnosis Date   Depression    Diabetes mellitus without complication ([place])    HIV infection ([place])    Tobacco use     Past Surgical History:  Procedure Laterality Date   None      Family History  Problem Relation Age of Onset   Hypertension Mother    Healthy Sister    Healthy Brother    Diabetes type I Daughter     Social History:  reports that he has quit smoking. His smoking use included cigarettes. He has a 23.00 pack-year smoking history. He has never used smokeless tobacco. He reports current alcohol use of about 6.0 standard drinks of alcohol per week. He reports that he does not use drugs.  Allergies: No Known Allergies  Medications: I have reviewed the patient's current medications.  Results for orders placed or performed during the hospital encounter of 03/09/22 (from the past 48 hour(s))  CBG monitoring, ED     Status: Abnormal   Collection Time: 03/09/22  9:17 AM  Result Value Ref Range   Glucose-Capillary 252 (H) 70 - 99 mg/dL    Comment: Glucose reference range applies only to samples taken after fasting for at least 8 hours.  CBC with Differential     Status: Abnormal   Collection Time: 03/09/22  9:23 AM  Result Value Ref Range   WBC 19.4 (H) 4.0 - 10.5 K/uL   RBC 4.71 4.22 - 5.81 MIL/uL   Hemoglobin 12.7 (L) 13.0 - 17.0 g/dL   HCT 36.6 (L) 39.0 - 52.0 %   MCV 77.7 (L) 80.0 - 100.0 fL   MCH 27.0 26.0 - 34.0 pg   MCHC 34.7 30.0 - 36.0  g/dL   RDW 13.5 11.5 - 15.5 %   Platelets 313 150 - 400 K/uL   nRBC 0.0 0.0 - 0.2 %   Neutrophils Relative % 82 %   Neutro Abs 15.9 (H) 1.7 - 7.7 K/uL   Lymphocytes Relative 10 %   Lymphs Abs 1.9 0.7 - 4.0 K/uL   Monocytes Relative 7 %   Monocytes Absolute 1.4 (H) 0.1 - 1.0 K/uL   Eosinophils Relative 0 %   Eosinophils Absolute 0.0 0.0 - 0.5 K/uL   Basophils Relative 0 %   Basophils Absolute 0.1 0.0 - 0.1 K/uL   Immature Granulocytes 1 %   Abs Immature Granulocytes 0.10 (H) 0.00 - 0.07 K/uL    Comment: Performed at [facility], [place] [address], [place], [place] [address]  Basic metabolic panel     Status: Abnormal   Collection Time: 03/09/22  9:23 AM  Result Value Ref Range   Sodium 133 (L) 135 - 145 mmol/L   Potassium 3.8 3.5 - 5.1 mmol/L   Chloride 100 98 - 111 mmol/L   CO2 24 22 - 32 mmol/L   Glucose, Bld 240 (H) 70 - 99 mg/dL    Comment: Glucose reference range applies only to samples taken  after fasting for at least 8 hours.   BUN 13 8 - 23 mg/dL   Creatinine, Ser 1.02 0.61 - 1.24 mg/dL   Calcium 8.7 (L) 8.9 - 10.3 mg/dL   GFR, Estimated >60 >60 mL/min    Comment: (NOTE) Calculated using the CKD-EPI Creatinine Equation (2021)    Anion gap 9 5 - 15    Comment: Performed at [facility], [place] [address] [place], [place] [address]  CBG monitoring, ED     Status: Abnormal   Collection Time: 03/09/22  1:42 PM  Result Value Ref Range   Glucose-Capillary 173 (H) 70 - 99 mg/dL    Comment: Glucose reference range applies only to samples taken after fasting for at least 8 hours.    CT HAND RIGHT W CONTRAST  Result Date: 03/09/2022 CLINICAL DATA:  Soft tissue infection suspected, hand, xray done EXAM: CT OF THE UPPER RIGHT EXTREMITY WITH CONTRAST TECHNIQUE: Multidetector CT imaging of the upper right extremity was performed according to the standard protocol following intravenous contrast administration. RADIATION DOSE REDUCTION: This exam  was performed according to the departmental dose-optimization program which includes automated exposure control, adjustment of the mA and/or kV according to patient size and/or use of iterative reconstruction technique. CONTRAST:  137m OMNIPAQUE IOHEXOL 300 MG/ML  SOLN COMPARISON:  Hand radiograph 03/09/2022 FINDINGS: Bones/Joint/Cartilage There is a chronic posttraumatic deformity of the fifth metacarpal. There is no evidence of acute fracture. Alignment is normal. 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 No bone erosion or frank bony destruction. No obvious joint effusion. Ligaments Suboptimally assessed by CT. Muscles and Tendons No acute myotendinous abnormality by CT. There is no obvious tenosynovitis. Soft tissues Diffuse soft tissue swelling of the hand extending into the fingers, most prominently into the middle finger. No soft tissue gas. No focal fluid collection. IMPRESSION: Extensive soft tissue swelling of the hand extending into the fingers, most prominently the middle finger, as can be seen in cellulitis. No evidence of soft tissue abscess, obvious tenosynovitis, or osseous changes to suggest osteomyelitis by CT. Electronically Signed   By: JMaurine SimmeringM.D.   On: 03/09/2022 10:36   DG Hand Complete Right  Result Date: 03/09/2022 CLINICAL DATA:  Cellulitis. Inflammation to the right hand and fingers. Swollen and hot to touch. EXAM: RIGHT HAND - COMPLETE 3+ VIEW COMPARISON:  None Available. FINDINGS: There is no evidence of fracture or dislocation. No cortical erosion or periosteal reaction. Contour irregularity of the fifth metacarpal, likely sequela of prior trauma. No significant arthropathy. Marked soft tissue swelling prominent about the dorsum of the hand. IMPRESSION: Marked soft tissue swelling prominent about the dorsum of the hand. No acute fracture or dislocation. No radiographic evidence of osteomyelitis. Electronically Signed   By: IKeane PoliceD.O.   On: 03/09/2022 10:00    Review of  Systems  HENT:  Negative for ear discharge, ear pain, hearing loss and tinnitus.   Eyes:  Negative for photophobia and pain.  Respiratory:  Negative for cough and shortness of breath.   Cardiovascular:  Negative for chest pain.  Gastrointestinal:  Negative for abdominal pain, nausea and vomiting.  Genitourinary:  Negative for dysuria, flank pain, frequency and urgency.  Musculoskeletal:  Positive for arthralgias (Right hand). Negative for back pain, myalgias and neck pain.  Neurological:  Negative for dizziness and headaches.  Hematological:  Does not bruise/bleed easily.  Psychiatric/Behavioral:  The patient is not nervous/anxious.    Blood pressure (!) 120/92, pulse (!) 103, temperature 99.7 F (  37.6 C), temperature source Oral, resp. rate 20, height '5\' 9"'$  (1.753 m), weight 71.2 kg, SpO2 96 %. Physical Exam Constitutional:      General: He is not in acute distress.    Appearance: He is well-developed. He is not diaphoretic.  HENT:     Head: Normocephalic and atraumatic.  Eyes:     General: No scleral icterus.       Right eye: No discharge.        Left eye: No discharge.     Conjunctiva/sclera: Conjunctivae normal.  Cardiovascular:     Rate and Rhythm: Normal rate and regular rhythm.  Pulmonary:     Effort: Pulmonary effort is normal. No respiratory distress.  Musculoskeletal:     Cervical back: Normal range of motion.     Comments: Right shoulder, elbow, wrist, digits- no skin wounds, mod TTP hand, severe TTP volar fingers/hand, fusiform edema long finger, generalized erythema with some streaking towards elbow, no instability, no blocks to motion  Sens  Ax/R/M/U intact  Mot   Ax/ R/ PIN/ M/ AIN/ U intact  Rad 2+  Skin:    General: Skin is warm and dry.  Neurological:     Mental Status: He is alert.  Psychiatric:        Mood and Affect: Mood normal.        Behavior: Behavior normal.     Assessment/Plan: Right hand infection -- Plan I&D today by Dr. Tempie Donning. Will need  several days of IV abx and possible repeat I&D depending on response. Please keep NPO. Multiple medical problems including DM and HIV -- per primary service   Lisette Abu, PA-C Orthopedic Surgery [phone] 03/09/2022, 2:33 PM   Patient seen and examined.  Diffuse dorsal hand swelling with focal fluctuance over dorsal aspect of middle finger.  Reviewed CT scan which demonstrates diffuse soft tissue swelling without abscess.  Will plan for I&D of the hand today and will leave penrose drain.  Will take intraoperative cultures.  Patient to be admitted to the hospitalist for IV abx and BG management.  Additional plan to follow in postop note.  Sherilyn Cooter, M.D. [facility]

## 2022-03-09 NOTE — Op Note (Addendum)
Date of Surgery: 03/09/2022  INDICATIONS: Patient is a 66 y.o.-year-old male with right hand infection that seems to mostly involve the dorsal aspect of the middle finger.  He did complain of pain over the palm of the hand in the area of the middle finger A1 pulley. A CT scan of the hand was obtained which suggested diffuse cellulitis without focal fluid collection or abscess.  Risks, benefits, and alternatives to surgery were again discussed with the patient in the preoperative area. The patient wishes to proceed with surgery.  Informed consent was signed after our discussion.   PREOPERATIVE DIAGNOSIS:  Right dorsal middle finger abscess  POSTOPERATIVE DIAGNOSIS: Same.  PROCEDURE:  Irrigation and excisional debridement of dorsal middle finger abscess Right middle finger A1 pulley release   SURGEON: Audria Nine, M.D.  ASSIST:   ANESTHESIA:  general  IV FLUIDS AND URINE: See anesthesia.  ESTIMATED BLOOD LOSS: <5 mL.  IMPLANTS: * No implants in log *   DRAINS: Penrose drain x 1  COMPLICATIONS: None  DESCRIPTION OF PROCEDURE: The patient was met in the preoperative holding area where the surgical site was marked and the consent form was signed.  The patient was then taken to the operating room and transferred to the operating table.  All bony prominences were well padded.  A tourniquet was applied to the right upper arm.  General endotracheal anesthesia was induced.  The operative extremity was prepped and draped in the usual and sterile fashion.  A formal time-out was performed to confirm that this was the correct patient, surgery, side, and site.   Following formal timeout, the limb was gently exsanguinated by gravity and the tourniquet inflated 250 mmHg.  I began by making a longitudinal incision over the dorsal aspect of the middle finger over the level of the proximal phalanx.  There seem to be an abscess in the area.  The skin was incised.  Blunt dissection of the time  scissor was used to dissect the subcutaneous tissue.  There was immediate return of purulent material from the area.  Aerobic and anaerobic culture swabs were taken.  Undermining of the skin both distally towards the PIP joint and proximally towards the MCP joint was performed to break up any potential loculations.  A counterincision was made at the dorsal aspect of the hand over the middle finger metacarpal.  The skin was incised.  Blunt dissection was similarly used to dissect the subcutaneous tissue.  There is no purulence or at this level of the hand.  Blunt dissection was used in distal fashion to undermine the area between the finger and dorsal hand incision.  There did not appear to be any purulence coming from proximal to the dorsal finger wound.  A synovial rondure was used to debride any necrotic or nonviable appearing tissue from the wound bed over the dorsal aspect of the middle finger.  I then turned my attention to the volar aspect of the hand.  Patient previously was complaining of pain over the level of the A1 pulley of the middle finger.  He did not seem to have any pain into the flexor surface of the finger as would be expected with flexor tenosynovitis.  The skin over the A1 pulley was incised.  Blunt dissection was used to identify the A1 pulley.  Ragnell retractors were placed radially and ulnarly to protect the corresponding neurovascular bundles.  A fresh 15 blade was used to enter the A1 pulley.  The A1 pulley was released under  direct visualization using a tenotomy scissor.  There is no purulence encountered within the tendon sheath.  The finger was taken through range of motion and again no purulence was seen.  At this point, all wounds were thoroughly irrigated with copious sterile saline.  Each wound was loosely reapproximated with a single nylon suture.  A Penrose drain was placed from the distal aspect of the wound over the dorsum of the hand into the wound over the dorsum of the  finger.  The tourniquet was let down hemostasis was achieved with direct pressure of the wound.  All fingers were warm, pink, and well-perfused at the end of the surgery.  The wound was then dressed with Xeroform, folded Kerlix, and an Ace wrap was applied.  The patient was reversed from anesthesia and extubated uneventfully.  They were transferred from the operating table to the postoperative bed.  All counts were correct x 2 at the end of the procedure.  The patient was then taken to the PACU in stable condition.   POSTOPERATIVE PLAN: He will be admitted for IV antibiotics and blood sugar control.  I will follow up intraoperative cultures to allow for tailored antibiotic choice.  Wound care will start tomorrow with soaks of the finger in warm, diluted Hibiclens solution. [place] pending appearance of the wound and final antibiotic choice.   Audria Nine, MD 6:35 PM

## 2022-03-09 NOTE — Assessment & Plan Note (Signed)
Rapidly progressing very severe, with possible abscess or tendinosis I suspect - I gave toradodl 30 mg IM x 1 for pain, but advised to go to [facility] ED now for further imaging, IV antibx and possible I and D.

## 2022-03-09 NOTE — H&P (Signed)
History and Physical  Patient: Ronald Castaneda SJG:[id number] DOB: [birth year] DOA: 03/09/2022 DOS: the patient was seen and examined on 03/09/2022 Patient coming from: Home  Chief Complaint:  Chief Complaint  Patient presents with   Cellulitus of finger   HPI: Ronald Castaneda is a 66 y.o. male with PMH significant of type 2 diabetes mellitus, depression, HIV infection, active smoker. Patient presented to the hospital with complaints of swelling of his third finger on the right hand. Patient is not sure but he mentions that he went to get his trash can and the next and that he notices that he started having pain and swelling of his third finger.  This progressively worsen for which he went to see his PCP and patient was referred to the hospital. Reportedly patient informed the PCP that there was a bug bite. Patient does have some chills but no fever.  No nausea no vomiting.  No diarrhea no constipation.  No other lumps or bumps anywhere else. Denies any similar swelling or infection in the past.  Review of Systems: As mentioned in the history of present illness. All other systems reviewed and are negative. Past Medical History:  Diagnosis Date   Depression    Diabetes mellitus without complication ([place])    HIV infection ([place])    Tobacco use    Past Surgical History:  Procedure Laterality Date   None     Social History:  reports that he has quit smoking. His smoking use included cigarettes. He has a 23.00 pack-year smoking history. He has never used smokeless tobacco. He reports current alcohol use of about 6.0 standard drinks of alcohol per week. He reports that he does not use drugs. No Known Allergies Family History  Problem Relation Age of Onset   Hypertension Mother    Healthy Sister    Healthy Brother    Diabetes type I Daughter    Prior to Admission medications   Medication Sig Start Date End Date Taking? Authorizing Provider  albuterol (VENTOLIN HFA) 108 (90 Base) MCG/ACT  inhaler Inhale 2 puffs into the lungs every 6 (six) hours as needed for wheezing or shortness of breath. 02/08/22   Hoyt Koch, MD  amLODipine (NORVASC) 10 MG tablet Take 1 tablet (10 mg total) by mouth daily. 01/22/22   Hoyt Koch, MD  aspirin 81 MG chewable tablet Chew 1 tablet (81 mg total) by mouth daily. 04/22/21   Thurnell Lose, MD  blood glucose meter kit and supplies KIT Dispense based on patient and insurance preference. Use up to four times daily as directed. 02/28/22   Elodia Florence., MD  blood glucose meter kit and supplies KIT Use up to four times daily as directed. 03/01/22   Elodia Florence., MD  Continuous Blood Gluc Sensor (FREESTYLE LIBRE 3 SENSOR) MISC Place 1 sensor on the skin every 14 days. Use to check glucose continuously 03/05/22   Hoyt Koch, MD  cyclobenzaprine (FLEXERIL) 5 MG tablet Take 1 tablet (5 mg total) by mouth 3 (three) times daily as needed for muscle spasms. 01/22/22   Hoyt Koch, MD  Darunavir-Cobicistat-Emtricitabine-Tenofovir Alafenamide (SYMTUZA) 800-150-200-10 MG TABS TAKE 1 TABLET BY MOUTH DAILY WITH BREAKFAST. 10/14/21 10/14/22  Michel Bickers, MD  dolutegravir (TIVICAY) 50 MG tablet TAKE 1 TABLET (50 MG TOTAL) BY MOUTH DAILY. Patient taking differently: Take 50 mg by mouth daily. 10/14/21 10/14/22  Michel Bickers, MD  Fingerstix Lancets MISC Use as needed to check blood sugars up to  4 times daily 03/01/22   Elodia Florence., MD  fluconazole (DIFLUCAN) 150 MG tablet Take 1 tablet (150 mg total) by mouth every 3 (three) days. 03/05/22   Hoyt Koch, MD  glucose blood test strip use as needed to check blood sugars up to 4 times daily 03/01/22   Elodia Florence., MD  insulin glargine (LANTUS) 100 UNIT/ML Solostar Pen Inject 18 Units into the skin daily. 03/09/22   Biagio Borg, MD  insulin lispro (HUMALOG) 100 UNIT/ML KwikPen Inject 3 Units into the skin 3 (three) times daily with meals.  03/09/22   Biagio Borg, MD  Insulin Pen Needle 32G X 4 MM MISC use as directed 03/01/22   Elodia Florence., MD  meloxicam (MOBIC) 15 MG tablet Take 1 tablet (15 mg total) by mouth daily. 01/22/22   Hoyt Koch, MD  oxymetazoline (AFRIN) 0.05 % nasal spray Place 1 spray into both nostrils 2 (two) times daily as needed for congestion.    [provider]  rosuvastatin (CRESTOR) 10 MG tablet Take 1 tablet (10 mg total) by mouth daily. 01/07/22   Hoyt Koch, MD  sildenafil (VIAGRA) 100 MG tablet Take 1 tablet (100 mg total) by mouth daily as needed for erectile dysfunction. 02/08/22   Hoyt Koch, MD  insulin aspart (NOVOLOG) 100 UNIT/ML FlexPen Inject 3 Units into the skin 3 (three) times daily with meals. 02/28/22 03/01/22  Elodia Florence., MD   Physical Exam: Vitals:   03/09/22 1200 03/09/22 1332 03/09/22 1344 03/09/22 1415  BP: (!) 143/86 115/73  (!) 120/92  Pulse: (!) 101 (!) 112  (!) 103  Resp:    20  Temp:   (!) 102 F (38.9 C) 99.7 F (37.6 C)  TempSrc:   Oral Oral  SpO2: 96% 99%  96%  Weight:    71.2 kg  Height:    5' 9" (1.753 m)   General: Appear in mild distress; no visible Abnormal Neck Mass Or lumps, Conjunctiva normal Cardiovascular: S1 and S2 Present, no Murmur, Respiratory: good respiratory effort, Bilateral Air entry present and CTA, no Crackles, no wheezes Abdomen: Bowel Sound present, Non tender  Extremities: Swelling of the right middle finger with decreased range of motion involving MCP joints.  No swelling or decreased ROM at wrist joint.  No other pedal edema Neurology: alert and oriented to time, place, and person Gait not checked due to patient safety concerns   Data Reviewed: I have Reviewed nursing notes, Vitals, and Lab results since pt's last encounter. Pertinent lab results CBC and BMP I have ordered test including CBC and BMP     Assessment and Plan Right third finger cellulitis. Hand surgery  consulted. X-ray and CT scan unremarkable for any gas-forming infection. Patient will be undergoing indication and debridement with hand surgery. Currently on IV vancomycin and IV cefepime. Follow-up on blood cultures. Will initiate gentle IV hydration.  Recently diagnosed type 2 diabetes mellitus. Gad 65 antibody negative. C-peptide normal. Patient is going to get outpatient pancreatic imaging in November 2023. Discharged on basal bolus regimen with Lantus. Currently we will initiate Lantus at a lower dose every 24 hours. Also every 4 hours sliding scale for now. Gentle IV hydration. Hemoglobin A1c 11.8.   HIV. We will continue home regimen of Symtuza and Tivicay.  COPD. No exacerbation. Continue home inhaler.  HLD. Continue Crestor.  HTN. On Norvasc at home which we will continue likely tomorrow.  Advance Care  Planning:   Code Status: Full Code  Consults: Hand surgery Family Communication: No one at bedside  Author: Berle Mull, MD 03/09/2022 3:21 PM For on call review [email or web address].

## 2022-03-09 NOTE — Anesthesia Preprocedure Evaluation (Signed)
Anesthesia Evaluation  Patient identified by MRN, date of birth, ID band Patient awake    Reviewed: Allergy & Precautions, NPO status , Patient's Chart, lab work & pertinent test results  Airway Mallampati: III  TM Distance: >3 FB Neck ROM: Full    Dental no notable dental hx.    Pulmonary COPD,  COPD inhaler, former smoker   Pulmonary exam normal        Cardiovascular hypertension, Pt. on medications Normal cardiovascular exam     Neuro/Psych  PSYCHIATRIC DISORDERS  Depression     Neuromuscular disease    GI/Hepatic negative GI ROS,,,(+)     substance abuse    Endo/Other  diabetes (A1c: 11.8), Poorly Controlled, Insulin Dependent    Renal/GU Renal disease     Musculoskeletal negative musculoskeletal ROS (+)    Abdominal   Peds  Hematology  (+) Blood dyscrasia, anemia , HIV  Anesthesia Other Findings CELLULITIS OF FINGER  Reproductive/Obstetrics                             Anesthesia Physical Anesthesia Plan  ASA: 3  Anesthesia Plan: General   Post-op Pain Management:    Induction: Intravenous  PONV Risk Score and Plan: 2 and Ondansetron, Dexamethasone, Midazolam and Treatment may vary due to age or medical condition  Airway Management Planned: LMA  Additional Equipment:   Intra-op Plan:   Post-operative Plan: Extubation in OR  Informed Consent: I have reviewed the patients History and Physical, chart, labs and discussed the procedure including the risks, benefits and alternatives for the proposed anesthesia with the patient or authorized representative who has indicated his/her understanding and acceptance.     Dental advisory given  Plan Discussed with: CRNA  Anesthesia Plan Comments:        Anesthesia Quick Evaluation

## 2022-03-09 NOTE — Progress Notes (Signed)
No report given from [facility] prior to arrival to [facility] surgical short stay.

## 2022-03-10 ENCOUNTER — Encounter (HOSPITAL_COMMUNITY): Payer: Self-pay | Admitting: Orthopedic Surgery

## 2022-03-10 DIAGNOSIS — L03113 Cellulitis of right upper limb: Secondary | ICD-10-CM | POA: Diagnosis not present

## 2022-03-10 LAB — COMPREHENSIVE METABOLIC PANEL
ALT: 18 U/L (ref 0–44)
AST: 19 U/L (ref 15–41)
Albumin: 2.6 g/dL — ABNORMAL LOW (ref 3.5–5.0)
Alkaline Phosphatase: 76 U/L (ref 38–126)
Anion gap: 11 (ref 5–15)
BUN: 8 mg/dL (ref 8–23)
CO2: 24 mmol/L (ref 22–32)
Calcium: 9 mg/dL (ref 8.9–10.3)
Chloride: 98 mmol/L (ref 98–111)
Creatinine, Ser: 0.93 mg/dL (ref 0.61–1.24)
GFR, Estimated: >60 mL/min (ref >60)
Glucose, Bld: 114 mg/dL — ABNORMAL HIGH (ref 70–99)
Potassium: 3.6 mmol/L (ref 3.5–5.1)
Sodium: 133 mmol/L — ABNORMAL LOW (ref 135–145)
Total Bilirubin: 0.5 mg/dL (ref 0.3–1.2)
Total Protein: 7.5 g/dL (ref 6.5–8.1)

## 2022-03-10 LAB — CBC
HCT: 34.7 % — ABNORMAL LOW (ref 39.0–52.0)
Hemoglobin: 12.2 g/dL — ABNORMAL LOW (ref 13.0–17.0)
MCH: 27.4 pg (ref 26.0–34.0)
MCHC: 35.2 g/dL (ref 30.0–36.0)
MCV: 77.8 fL — ABNORMAL LOW (ref 80.0–100.0)
Platelets: 301 10*3/uL (ref 150–400)
RBC: 4.46 MIL/uL (ref 4.22–5.81)
RDW: 13.6 % (ref 11.5–15.5)
WBC: 19.6 10*3/uL — ABNORMAL HIGH (ref 4.0–10.5)
nRBC: 0 % (ref 0.0–0.2)

## 2022-03-10 LAB — GLUCOSE, CAPILLARY
Glucose-Capillary: 71 mg/dL (ref 70–99)
Glucose-Capillary: 135 mg/dL — ABNORMAL HIGH (ref 70–99)
Glucose-Capillary: 212 mg/dL — ABNORMAL HIGH (ref 70–99)
Glucose-Capillary: 79 mg/dL (ref 70–99)

## 2022-03-10 MED ORDER — INSULIN ASPART 100 UNIT/ML IJ SOLN
0.0000 [IU] | Freq: Three times a day (TID) | INTRAMUSCULAR | Status: DC
  Administered 2022-03-10: 1 [IU] via SUBCUTANEOUS
  Administered 2022-03-10 – 2022-03-11 (×2): 3 [IU] via SUBCUTANEOUS
  Administered 2022-03-12: 2 [IU] via SUBCUTANEOUS
  Administered 2022-03-13: 5 [IU] via SUBCUTANEOUS
  Administered 2022-03-13 – 2022-03-15 (×3): 1 [IU] via SUBCUTANEOUS
  Administered 2022-03-15: 3 [IU] via SUBCUTANEOUS

## 2022-03-10 MED ORDER — CEFAZOLIN SODIUM-DEXTROSE 2-4 GM/100ML-% IV SOLN
2.0000 g | Freq: Three times a day (TID) | INTRAVENOUS | Status: DC
  Administered 2022-03-10 – 2022-03-15 (×16): 2 g via INTRAVENOUS
  Filled 2022-03-10 (×16): qty 100

## 2022-03-10 MED ORDER — CHLORHEXIDINE GLUCONATE 4 % EX LIQD
Freq: Two times a day (BID) | CUTANEOUS | Status: AC
  Filled 2022-03-10 (×8): qty 15

## 2022-03-10 MED ORDER — INSULIN ASPART 100 UNIT/ML IJ SOLN
0.0000 [IU] | Freq: Every day | INTRAMUSCULAR | Status: DC
  Administered 2022-03-14: 2 [IU] via SUBCUTANEOUS

## 2022-03-10 NOTE — Progress Notes (Signed)
PROGRESS NOTE    Ronald Castaneda  GEZ:[id number] DOB: 09-27-1955 DOA: 03/09/2022 PCP: Hoyt Koch, MD    Brief Narrative:  Ronald Castaneda is a 66 y.o. male with PMH significant of type 2 diabetes mellitus, depression, HIV infection, active smoker. Patient presented to the hospital with complaints of swelling of his third finger on the right hand.     Assessment and Plan: Right third finger cellulitis. S/p I/D with hand surgery 10/17 Currently on IV vancomycin and IV cefepime. Wound care per Dr. Tempie Donning:   Wound care will start tomorrow with soaks of the finger in warm, diluted Hibiclens solution.  -follow culture   Recently diagnosed type 2 diabetes mellitus. -lantus/SSI   HIV. We will continue home regimen of Symtuza and Tivicay.   COPD. No exacerbation. Continue home inhaler.   HLD. Continue Crestor.   HTN. -resume home meds as needed  DVT prophylaxis: enoxaparin (LOVENOX) injection 40 mg Start: 03/10/22 0800    Code Status: Full Code Family Communication:   Disposition Plan:  Level of care: Med-Surg Status is: Observation The patient will require care spanning > 2 midnights and should be moved to inpatient because: needs IV Abx    Consultants:  Ortho/hand   Subjective: Having some pain  Objective: Vitals:   03/09/22 2013 03/09/22 2339 03/10/22 0412 03/10/22 0808  BP: 135/89 115/69 134/76 124/72  Pulse: 93 82 94 87  Resp: '17 18 18 18  '$ Temp: 99.2 F (37.3 C) 99.2 F (37.3 C) 99.8 F (37.7 C) 99.6 F (37.6 C)  TempSrc: Oral Oral  Oral  SpO2: 97% 93% 93% 95%  Weight:      Height:        Intake/Output Summary (Last 24 hours) at 03/10/2022 1004 Last data filed at 03/09/2022 1940 Gross per 24 hour  Intake 400 ml  Output --  Net 400 ml   Filed Weights   03/09/22 0858 03/09/22 1415  Weight: 71.2 kg 71.2 kg    Examination:   General: Appearance:    Well developed, well nourished male in no acute distress     Lungs:      respirations unlabored  Heart:    Normal heart rate.    MS:   All extremities are intact.    Neurologic:   Awake, alert, oriented x 3. Right hand wrapped       Data Reviewed: I have personally reviewed following labs and imaging studies  CBC: Recent Labs  Lab 03/09/22 0923 03/10/22 0712  WBC 19.4* 19.6*  NEUTROABS 15.9*  --   HGB 12.7* 12.2*  HCT 36.6* 34.7*  MCV 77.7* 77.8*  PLT 313 650   Basic Metabolic Panel: Recent Labs  Lab 03/09/22 0923 03/10/22 0712  NA 133* 133*  K 3.8 3.6  CL 100 98  CO2 24 24  GLUCOSE 240* 114*  BUN 13 8  CREATININE 1.02 0.93  CALCIUM 8.7* 9.0   GFR: Estimated Creatinine Clearance: 78.1 mL/min (by C-G formula based on SCr of 0.93 mg/dL). Liver Function Tests: Recent Labs  Lab 03/10/22 0712  AST 19  ALT 18  ALKPHOS 76  BILITOT 0.5  PROT 7.5  ALBUMIN 2.6*   No results for input(s): "LIPASE", "AMYLASE" in the last 168 hours. No results for input(s): "AMMONIA" in the last 168 hours. Coagulation Profile: No results for input(s): "INR", "PROTIME" in the last 168 hours. Cardiac Enzymes: No results for input(s): "CKTOTAL", "CKMB", "CKMBINDEX", "TROPONINI" in the last 168 hours. BNP (last 3 results) No results for  input(s): "PROBNP" in the last 8760 hours. HbA1C: No results for input(s): "HGBA1C" in the last 72 hours. CBG: Recent Labs  Lab 03/09/22 1342 03/09/22 1728 03/09/22 1954 03/09/22 2337 03/10/22 0345  GLUCAP 173* 97 118* 172* 71   Lipid Profile: No results for input(s): "CHOL", "HDL", "LDLCALC", "TRIG", "CHOLHDL", "LDLDIRECT" in the last 72 hours. Thyroid Function Tests: No results for input(s): "TSH", "T4TOTAL", "FREET4", "T3FREE", "THYROIDAB" in the last 72 hours. Anemia Panel: No results for input(s): "VITAMINB12", "FOLATE", "FERRITIN", "TIBC", "IRON", "RETICCTPCT" in the last 72 hours. Sepsis Labs: No results for input(s): "PROCALCITON", "LATICACIDVEN" in the last 168 hours.  Recent Results (from the past  240 hour(s))  Culture, blood (routine x 2)     Status: None (Preliminary result)   Collection Time: 03/09/22  9:10 AM   Specimen: BLOOD  Result Value Ref Range Status   Specimen Description   Final    BLOOD BLOOD LEFT ARM Performed at [facility] [address] [place], [place] [address]    Special Requests   Final    BOTTLES DRAWN AEROBIC AND ANAEROBIC Blood Culture adequate volume Performed at [facility] [address], [place], [place] [address]    Culture   Final    NO GROWTH < 24 HOURS Performed at [facility] [address], [place], [place] [address]    Report Status PENDING  Incomplete  Culture, blood (routine x 2)     Status: None (Preliminary result)   Collection Time: 03/09/22  9:15 AM   Specimen: BLOOD LEFT HAND  Result Value Ref Range Status   Specimen Description   Final    BLOOD LEFT HAND Performed at [facility] [address], [place], [place] [address]    Special Requests   Final    BOTTLES DRAWN AEROBIC AND ANAEROBIC Blood Culture results may not be optimal due to an inadequate volume of blood received in culture bottles Performed at [facility] [address], [place], [place] [address]    Culture   Final    NO GROWTH < 24 HOURS Performed at [facility] [address] [place], [place] [address]    Report Status PENDING  Incomplete  Aerobic/Anaerobic Culture w Gram Stain (surgical/deep wound)     Status: None (Preliminary result)   Collection Time: 03/09/22  6:04 PM   Specimen: PATH Other; Body Fluid  Result Value Ref Range Status   Specimen Description WOUND  Final   Special Requests RIGHT MIDDLE FINGER  Final   Gram Stain   Final    MODERATE WBC PRESENT, PREDOMINANTLY MONONUCLEAR FEW GRAM POSITIVE COCCI IN CLUSTERS Performed at [facility], [place] [address] [place], [place] [address]    Culture PENDING  Incomplete   Report Status PENDING   Incomplete         Radiology Studies: CT HAND RIGHT W CONTRAST  Result Date: 03/09/2022 CLINICAL DATA:  Soft tissue infection suspected, hand, xray done EXAM: CT OF THE UPPER RIGHT EXTREMITY WITH CONTRAST TECHNIQUE: Multidetector CT imaging of the upper right extremity was performed according to the standard protocol following intravenous contrast administration. RADIATION DOSE REDUCTION: This exam was performed according to the departmental dose-optimization program which includes automated exposure control, adjustment of the mA and/or kV according to patient size and/or use of iterative reconstruction technique. CONTRAST:  167m OMNIPAQUE IOHEXOL 300 MG/ML  SOLN COMPARISON:  Hand radiograph 03/09/2022 FINDINGS: Bones/Joint/Cartilage There is a chronic  posttraumatic deformity of the fifth metacarpal. There is no evidence of acute fracture. Alignment is normal. 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 No bone erosion or frank bony destruction. No obvious joint effusion. Ligaments Suboptimally assessed by CT. Muscles and Tendons No acute myotendinous abnormality by CT. There is no obvious tenosynovitis. Soft tissues Diffuse soft tissue swelling of the hand extending into the fingers, most prominently into the middle finger. No soft tissue gas. No focal fluid collection. IMPRESSION: Extensive soft tissue swelling of the hand extending into the fingers, most prominently the middle finger, as can be seen in cellulitis. No evidence of soft tissue abscess, obvious tenosynovitis, or osseous changes to suggest osteomyelitis by CT. Electronically Signed   By: Maurine Simmering M.D.   On: 03/09/2022 10:36   DG Hand Complete Right  Result Date: 03/09/2022 CLINICAL DATA:  Cellulitis. Inflammation to the right hand and fingers. Swollen and hot to touch. EXAM: RIGHT HAND - COMPLETE 3+ VIEW COMPARISON:  None Available. FINDINGS: There is no evidence of fracture or dislocation. No cortical erosion or periosteal reaction.  Contour irregularity of the fifth metacarpal, likely sequela of prior trauma. No significant arthropathy. Marked soft tissue swelling prominent about the dorsum of the hand. IMPRESSION: Marked soft tissue swelling prominent about the dorsum of the hand. No acute fracture or dislocation. No radiographic evidence of osteomyelitis. Electronically Signed   By: Keane Police D.O.   On: 03/09/2022 10:00        Scheduled Meds:  amLODipine  10 mg Oral Daily   aspirin EC  81 mg Oral Daily   Darunavir-Cobicistat-Emtricitabine-Tenofovir Alafenamide  1 tablet Oral Q breakfast   docusate sodium  100 mg Oral BID   dolutegravir  50 mg Oral Daily   enoxaparin (LOVENOX) injection  40 mg Subcutaneous Q24H   insulin aspart  0-5 Units Subcutaneous QHS   insulin aspart  0-9 Units Subcutaneous TID WC   insulin glargine-yfgn  8 Units Subcutaneous Q24H   rosuvastatin  10 mg Oral Daily   Continuous Infusions:  ceFEPime (MAXIPIME) IV 2 g (03/10/22 0828)   lactated ringers     vancomycin       LOS: 1 day    Time spent: 45 minutes spent on chart review, discussion with nursing staff, consultants, updating family and interview/physical exam; more than 50% of that time was spent in counseling and/or coordination of care.    Geradine Girt, DO [facility] Available via Epic secure chat 7am-7pm After these hours, please refer to coverage provider listed on [email or web address] 03/10/2022, 10:04 AM

## 2022-03-10 NOTE — Progress Notes (Signed)
Orthopedic Tech Progress Note Patient Details:  Ronald Castaneda [birth year] [id number]  Ortho Devices Type of Ortho Device: Sling arm elevator Ortho Device/Splint Location: RUE Ortho Device/Splint Interventions: Ordered, Application, Adjustment   Post Interventions Patient Tolerated: Well Instructions Provided: Care of device  Janit Pagan 03/10/2022, 4:39 PM

## 2022-03-10 NOTE — Progress Notes (Signed)
  Subjective:  Resting comfortably.  Thinks his hand pain is improved from yesterday.    Objective:   VITALS:   Vitals:   03/10/22 1048 03/10/22 1100 03/10/22 1114 03/10/22 1145  BP:  136/86 (!) 135/93 (!) 141/88  Pulse: 87 86 86 67  Resp: '13 16 19 18  '$ Temp:    97.9 F (36.6 C)  TempSrc:      SpO2: 93% 98% 98% 98%  Weight:      Height:        Gen: NAD, resting comfortably Pulm: Normal WOB on RA CV: Normal rate, RUE warm and well perfused RUE: Dressing w/ mild bloody drainage, penrose pulled, scant drainage from wound, limited ROM of fingers secondary to pain, erythema and swelling of dorsal hand largely unchanged from previous    Lab Results  Component Value Date   WBC 12.1 (H) 03/03/2022   HGB 14.4 03/03/2022   HCT 43.4 03/03/2022   MCV 90.6 03/03/2022   PLT 361 03/03/2022     Assessment/Plan:  66 yo M POD 1 s/p I&D of right hand w/ abscess over dorsal middle finger.  - Intraop cultures growing S.aureus, continue IV abx, will follow cultures - Wound care starting today; daily soaks with warm, diluted Hibiclens solution and application of clean, dry dressing - Will continue to follow  Sherilyn Cooter, MD 03/10/2022, 3:56 PM [phone]

## 2022-03-10 NOTE — Progress Notes (Signed)
Patient to transfer to 1G98. Report given to RN. Patient in no signs of distress at this time. Dressing change by Surgeon this afternoon

## 2022-03-11 DIAGNOSIS — E119 Type 2 diabetes mellitus without complications: Secondary | ICD-10-CM

## 2022-03-11 DIAGNOSIS — L089 Local infection of the skin and subcutaneous tissue, unspecified: Secondary | ICD-10-CM | POA: Diagnosis present

## 2022-03-11 DIAGNOSIS — Z21 Asymptomatic human immunodeficiency virus [HIV] infection status: Secondary | ICD-10-CM | POA: Diagnosis not present

## 2022-03-11 DIAGNOSIS — L03113 Cellulitis of right upper limb: Secondary | ICD-10-CM | POA: Diagnosis not present

## 2022-03-11 LAB — BASIC METABOLIC PANEL
Anion gap: 10 (ref 5–15)
BUN: 8 mg/dL (ref 8–23)
CO2: 27 mmol/L (ref 22–32)
Calcium: 8.5 mg/dL — ABNORMAL LOW (ref 8.9–10.3)
Chloride: 97 mmol/L — ABNORMAL LOW (ref 98–111)
Creatinine, Ser: 1.18 mg/dL (ref 0.61–1.24)
GFR, Estimated: >60 mL/min (ref >60)
Glucose, Bld: 130 mg/dL — ABNORMAL HIGH (ref 70–99)
Potassium: 3.4 mmol/L — ABNORMAL LOW (ref 3.5–5.1)
Sodium: 134 mmol/L — ABNORMAL LOW (ref 135–145)

## 2022-03-11 LAB — CBC
HCT: 33.7 % — ABNORMAL LOW (ref 39.0–52.0)
Hemoglobin: 11.6 g/dL — ABNORMAL LOW (ref 13.0–17.0)
MCH: 26.9 pg (ref 26.0–34.0)
MCHC: 34.4 g/dL (ref 30.0–36.0)
MCV: 78 fL — ABNORMAL LOW (ref 80.0–100.0)
Platelets: 292 10*3/uL (ref 150–400)
RBC: 4.32 MIL/uL (ref 4.22–5.81)
RDW: 13.7 % (ref 11.5–15.5)
WBC: 15.9 10*3/uL — ABNORMAL HIGH (ref 4.0–10.5)
nRBC: 0 % (ref 0.0–0.2)

## 2022-03-11 LAB — GLUCOSE, CAPILLARY
Glucose-Capillary: 115 mg/dL — ABNORMAL HIGH (ref 70–99)
Glucose-Capillary: 106 mg/dL — ABNORMAL HIGH (ref 70–99)
Glucose-Capillary: 201 mg/dL — ABNORMAL HIGH (ref 70–99)
Glucose-Capillary: 122 mg/dL — ABNORMAL HIGH (ref 70–99)

## 2022-03-11 MED ORDER — POTASSIUM CHLORIDE CRYS ER 20 MEQ PO TBCR
40.0000 meq | EXTENDED_RELEASE_TABLET | Freq: Once | ORAL | Status: AC
  Administered 2022-03-11: 40 meq via ORAL
  Filled 2022-03-11: qty 2

## 2022-03-11 NOTE — Progress Notes (Signed)
PROGRESS NOTE    Ronald Castaneda  NFA:[id number] DOB: 1955-12-11 DOA: 03/09/2022 PCP: Hoyt Koch, MD    Brief Narrative:  Ronald Castaneda is a 66 y.o. male with PMH significant of type 2 diabetes mellitus, depression, HIV infection, active smoker. Patient presented to the hospital with complaints of swelling of his third finger on the right hand. S/p I and D.      Assessment and Plan: Right third finger cellulitis. S/p I/D with hand surgery 10/17 IV abx-- narrow once culture back Per ortho: Will continued daily wound care with soaks of finger in diluted Hibiclens solution - Will continue to follow cultures, currently S.aureus with susceptibilities to follow - May need repeat I&D if no progress in pain and lack of ROM in next few days -follow culture   Recently diagnosed type 2 diabetes mellitus. -lantus/SSI   HIV. We will continue home regimen of Symtuza and Tivicay.   COPD. No exacerbation. Continue home inhaler.   HLD. Continue Crestor.   Hypokalemia -replete  HTN. -resume home meds as needed  DVT prophylaxis: enoxaparin (LOVENOX) injection 40 mg Start: 03/10/22 0800    Code Status: Full Code Family Communication:   Disposition Plan:  Level of care: Med-Surg Status is: Observation The patient will require care spanning > 2 midnights and should be moved to inpatient because: needs IV Abx, pain control    Consultants:  Ortho/hand   Subjective: Pain remains an issue  Objective: Vitals:   03/11/22 0000 03/11/22 0001 03/11/22 0443 03/11/22 0853  BP: 129/71 129/71 124/73 126/72  Pulse: 84 84 86 83  Resp: '17 16 17 16  '$ Temp: 98.5 F (36.9 C) 98.5 F (36.9 C) 98.5 F (36.9 C) 99.4 F (37.4 C)  TempSrc: Oral Oral Oral Oral  SpO2: 95% (!) 89% 93% 95%  Weight:      Height:        Intake/Output Summary (Last 24 hours) at 03/11/2022 1238 Last data filed at 03/10/2022 2340 Gross per 24 hour  Intake 500 ml  Output --  Net 500 ml   Filed  Weights   03/09/22 0858 03/09/22 1415  Weight: 71.2 kg 71.2 kg    Examination:    General: Appearance:    Well developed, well nourished male in no acute distress   Hand wrapped and elevated  Lungs:     respirations unlabored  Heart:    Normal heart rate. Normal rhythm. No murmurs, rubs, or gallops.   MS:   All extremities are intact.   Neurologic:   Awake, alert         Data Reviewed: I have personally reviewed following labs and imaging studies  CBC: Recent Labs  Lab 03/09/22 0923 03/10/22 0712 03/11/22 0221  WBC 19.4* 19.6* 15.9*  NEUTROABS 15.9*  --   --   HGB 12.7* 12.2* 11.6*  HCT 36.6* 34.7* 33.7*  MCV 77.7* 77.8* 78.0*  PLT 313 301 469   Basic Metabolic Panel: Recent Labs  Lab 03/09/22 0923 03/10/22 0712 03/11/22 0221  NA 133* 133* 134*  K 3.8 3.6 3.4*  CL 100 98 97*  CO2 '24 24 27  '$ GLUCOSE 240* 114* 130*  BUN '13 8 8  '$ CREATININE 1.02 0.93 1.18  CALCIUM 8.7* 9.0 8.5*   GFR: Estimated Creatinine Clearance: 61.6 mL/min (by C-G formula based on SCr of 1.18 mg/dL). Liver Function Tests: Recent Labs  Lab 03/10/22 0712  AST 19  ALT 18  ALKPHOS 76  BILITOT 0.5  PROT 7.5  ALBUMIN 2.6*  No results for input(s): "LIPASE", "AMYLASE" in the last 168 hours. No results for input(s): "AMMONIA" in the last 168 hours. Coagulation Profile: No results for input(s): "INR", "PROTIME" in the last 168 hours. Cardiac Enzymes: No results for input(s): "CKTOTAL", "CKMB", "CKMBINDEX", "TROPONINI" in the last 168 hours. BNP (last 3 results) No results for input(s): "PROBNP" in the last 8760 hours. HbA1C: No results for input(s): "HGBA1C" in the last 72 hours. CBG: Recent Labs  Lab 03/10/22 1151 03/10/22 1735 03/10/22 2134 03/11/22 0850 03/11/22 1155  GLUCAP 135* 212* 79 115* 106*   Lipid Profile: No results for input(s): "CHOL", "HDL", "LDLCALC", "TRIG", "CHOLHDL", "LDLDIRECT" in the last 72 hours. Thyroid Function Tests: No results for input(s):  "TSH", "T4TOTAL", "FREET4", "T3FREE", "THYROIDAB" in the last 72 hours. Anemia Panel: No results for input(s): "VITAMINB12", "FOLATE", "FERRITIN", "TIBC", "IRON", "RETICCTPCT" in the last 72 hours. Sepsis Labs: No results for input(s): "PROCALCITON", "LATICACIDVEN" in the last 168 hours.  Recent Results (from the past 240 hour(s))  Culture, blood (routine x 2)     Status: None (Preliminary result)   Collection Time: 03/09/22  9:10 AM   Specimen: BLOOD  Result Value Ref Range Status   Specimen Description   Final    BLOOD BLOOD LEFT ARM Performed at [facility] [address] [place], [place] [address]    Special Requests   Final    BOTTLES DRAWN AEROBIC AND ANAEROBIC Blood Culture adequate volume Performed at [facility] [address], [place], [place] [address]    Culture   Final    NO GROWTH 2 DAYS Performed at [facility] [address], [place], [place] [address]    Report Status PENDING  Incomplete  Culture, blood (routine x 2)     Status: None (Preliminary result)   Collection Time: 03/09/22  9:15 AM   Specimen: BLOOD LEFT HAND  Result Value Ref Range Status   Specimen Description   Final    BLOOD LEFT HAND Performed at [facility] [address] [place], [place] [address]    Special Requests   Final    BOTTLES DRAWN AEROBIC AND ANAEROBIC Blood Culture results may not be optimal due to an inadequate volume of blood received in culture bottles Performed at [facility] [address] [place], [place] [address]    Culture   Final    NO GROWTH 2 DAYS Performed at [facility] [address], [place], [place] [address]    Report Status PENDING  Incomplete  Aerobic/Anaerobic Culture w Gram Stain (surgical/deep wound)     Status: None (Preliminary result)   Collection Time: 03/09/22  6:04 PM   Specimen: PATH Other; Body Fluid  Result Value Ref Range Status   Specimen  Description WOUND  Final   Special Requests RIGHT MIDDLE FINGER  Final   Gram Stain   Final    MODERATE WBC PRESENT, PREDOMINANTLY MONONUCLEAR FEW GRAM POSITIVE COCCI IN CLUSTERS    Culture   Final    ABUNDANT STAPHYLOCOCCUS AUREUS SUSCEPTIBILITIES TO FOLLOW Performed at [facility], [place] [address] [place],  [address]    Report Status PENDING  Incomplete         Radiology Studies: No results found.      Scheduled Meds:  amLODipine  10 mg Oral Daily   aspirin EC  81 mg Oral Daily   chlorhexidine   Topical BID   Darunavir-Cobicistat-Emtricitabine-Tenofovir Alafenamide  1 tablet Oral Q breakfast   docusate sodium  100 mg Oral BID   dolutegravir  50 mg Oral Daily   enoxaparin (LOVENOX) injection  40 mg Subcutaneous Q24H   insulin aspart  0-5 Units Subcutaneous QHS   insulin aspart  0-9 Units Subcutaneous TID WC   insulin glargine-yfgn  8 Units Subcutaneous Q24H   rosuvastatin  10 mg Oral Daily   Continuous Infusions:   ceFAZolin (ANCEF) IV 2 g (03/11/22 0854)   vancomycin 1,500 mg (03/11/22 1002)     LOS: 1 day    Time spent: 45 minutes spent on chart review, discussion with nursing staff, consultants, updating family and interview/physical exam; more than 50% of that time was spent in counseling and/or coordination of care.    Geradine Girt, DO [facility] Available via Epic secure chat 7am-7pm After these hours, please refer to coverage provider listed on [email or web address] 03/11/2022, 12:38 PM

## 2022-03-11 NOTE — Progress Notes (Signed)
   Subjective:  Patient resting comfortably this AM.  Pain seems improved from yesterday but still present with finger ROM.  Denies systemic symptoms  Objective:   VITALS:   Vitals:   03/11/22 0000 03/11/22 0001 03/11/22 0443 03/11/22 0853  BP: 129/71 129/71 124/73 126/72  Pulse: 84 84 86 83  Resp: '17 16 17 16  '$ Temp: 98.5 F (36.9 C) 98.5 F (36.9 C) 98.5 F (36.9 C) 99.4 F (37.4 C)  TempSrc: Oral Oral Oral Oral  SpO2: 95% (!) 89% 93% 95%  Weight:      Height:        Gen: NAD, resting comfortably Pulm: Normal WOB on RA CV: Normal rate RUE: Dressing clean and dry, volar incisions clean and dry without drainage, scant drainage from dorsal incision over middle finger, no drainage from dorsum of hand, still limited finger ROM secondary to diffuse pain in hand    Lab Results  Component Value Date   WBC 15.9 (H) 03/11/2022   HGB 11.6 (L) 03/11/2022   HCT 33.7 (L) 03/11/2022   MCV 78.0 (L) 03/11/2022   PLT 292 03/11/2022     Assessment/Plan:  66 yo M now POD 2 s/p I&D of right dorsal middle finger abscess.  - Will continued daily wound care with soaks of finger in diluted Hibiclens solution - Will continue to follow cultures, currently S.aureus with susceptibilities to follow - May need repeat I&D if no progress in pain and lack of ROM in next few days   Sherilyn Cooter, MD 03/11/2022, 12:00 PM [phone]

## 2022-03-12 DIAGNOSIS — L03113 Cellulitis of right upper limb: Secondary | ICD-10-CM | POA: Diagnosis not present

## 2022-03-12 LAB — GLUCOSE, CAPILLARY
Glucose-Capillary: 115 mg/dL — ABNORMAL HIGH (ref 70–99)
Glucose-Capillary: 120 mg/dL — ABNORMAL HIGH (ref 70–99)
Glucose-Capillary: 183 mg/dL — ABNORMAL HIGH (ref 70–99)
Glucose-Capillary: 141 mg/dL — ABNORMAL HIGH (ref 70–99)

## 2022-03-12 NOTE — TOC Initial Note (Signed)
Transition of Care [facility]) - Initial/Assessment Note    Patient Details  Name: Ronald Castaneda MRN: [id number] Date of Birth: December 14, 1955  Transition of Care [facility]) CM/SW Contact:    Marilu Favre, RN Phone Number: 03/12/2022, 11:54 AM  Clinical Narrative:                  Spoke to patient at bedside.   Patient's PCP Pricilla Holm.   Patient from home with wife.  Once discharge wound care determined patient and wife will be provided wound care education prior to discharge by bedside nurse   Patient voiced understanding     Transition of Care Department [facility]) has will continue to monitor patient advancement through interdisciplinary progression rounds. If new patient transition needs arise, please place a TOC consult.   Expected Discharge Plan: Home/Self Care Barriers to Discharge: Continued Medical Work up   Patient Goals and CMS Choice Patient states their goals for this hospitalization and ongoing recovery are:: to return to home      Expected Discharge Plan and Services Expected Discharge Plan: Home/Self Care   Discharge Planning Services: CM Consult   Living arrangements for the past 2 months: Single Family Home                   DME Agency: NA                  Prior Living Arrangements/Services Living arrangements for the past 2 months: Single Family Home Lives with:: Spouse Patient language and need for interpreter reviewed:: Yes Do you feel safe going back to the place where you live?: Yes      Need for Family Participation in Patient Care: Yes (Comment) Care giver support system in place?: Yes (comment)   Criminal Activity/Legal Involvement Pertinent to Current Situation/Hospitalization: No - Comment as needed  Activities of Daily Living      Permission Sought/Granted   Permission granted to share information with : No              Emotional Assessment Appearance:: Appears stated age Attitude/Demeanor/Rapport: Engaged Affect  (typically observed): Accepting Orientation: : Oriented to Self, Oriented to Place, Oriented to  Time, Oriented to Situation Alcohol / Substance Use: Not Applicable Psych Involvement: No (comment)  Admission diagnosis:  Cellulitis of hand, right [L03.113] Puncture wound of right hand with infection [S61.431A, L08.9] Infection of finger [L08.9] Patient Active Problem List   Diagnosis Date Noted   Infection of finger 03/11/2022   Cellulitis of right hand 03/09/2022   Puncture wound of right hand with infection 03/09/2022   DM (diabetes mellitus) ([place]) 03/05/2022   DKA (diabetic ketoacidosis) ([place]) 02/26/2022   AKI (acute kidney injury) ([place]) 02/26/2022   Back pain 01/22/2022   Neck pain 01/22/2022   Chest wall pain 01/22/2022   Dyspnea on exertion    Atherosclerosis of aorta (H[facility])    Hypercholesteremia    Benign hypertension    Smoking    COPD (chronic obstructive pulmonary disease) ([place]) 04/20/2021   Left hip pain 10/23/2020   Parotid cyst 07/20/2016   Cervical disc disorder with radiculopathy of cervical region 06/17/2016   Parotid nodule 06/17/2016   Erectile dysfunction 09/09/2015   Depression 04/01/2015   HIV disease ([place]) 02/27/2015   Routine general medical examination at a health care facility 05/03/2014   Lipoma of back 05/03/2014   Former smoker 05/03/2014   PCP:  Hoyt Koch, MD Pharmacy:   [id number] [place] [place] [phone] [place] [address] Phone: [phone] Fax: [phone]     Social Determinants of Health (SDOH) Interventions    Readmission Risk Interventions     No data to display

## 2022-03-12 NOTE — Plan of Care (Signed)
  Problem: Education: Goal: Ability to describe self-care measures that may prevent or decrease complications (Diabetes Survival Skills Education) will improve Outcome: Progressing   Problem: Health Behavior/Discharge Planning: Goal: Ability to identify and utilize available resources and services will improve Outcome: Progressing Goal: Ability to manage health-related needs will improve Outcome: Progressing

## 2022-03-12 NOTE — Progress Notes (Signed)
   Subjective:  Resting comfortably this AM.  Pain well controlled.  Denies systemic symptoms.   Objective:   VITALS:   Vitals:   03/11/22 1708 03/11/22 2235 03/12/22 0614 03/12/22 0746  BP: 121/73 132/77 121/73 113/73  Pulse: 87 80 74 73  Resp: '16 17 16 18  '$ Temp: 20.2 F (37.6 C) 98.2 F (36.8 C) 98.4 F (36.9 C) 98.2 F (36.8 C)  TempSrc: Oral  Oral Oral  SpO2: 95% 94% 98% 96%  Weight:      Height:        Gen: NAD, resting comfortably Pulm: Normal WOB on RA CV: BUE warm and well perfused RUE: Dressing clean and dry, no drainage from incisons, dorsal swelling seems mildly improved, limited finger ROM secondary to pain    Lab Results  Component Value Date   WBC 15.9 (H) 03/11/2022   HGB 11.6 (L) 03/11/2022   HCT 33.7 (L) 03/11/2022   MCV 78.0 (L) 03/11/2022   PLT 292 03/11/2022     Assessment/Plan:  66 yo M POD 2 s/p I&D of right hand with dorsal middle finger abscess.  - Continue IV abx, growing MSSA - Continue daily wound care with warm, diluted Hibiclens soaks and dressing changes - Will see again in AM, may need repeat I&D if minimal improvement   Sherilyn Cooter, MD 03/12/2022, 1:04 PM [phone]

## 2022-03-12 NOTE — Progress Notes (Signed)
PROGRESS NOTE    Ronald Castaneda  IWL:[id number] DOB: 01/31/1956 DOA: 03/09/2022 PCP: Hoyt Koch, MD    Brief Narrative:  Ronald Castaneda is a 66 y.o. male with PMH significant of type 2 diabetes mellitus, depression, HIV infection, active smoker. Patient presented to the hospital with complaints of swelling of his third finger on the right hand. S/p I and D.      Assessment and Plan: Right third finger cellulitis. S/p I/D with hand surgery 10/17 IV abx-- narrow once culture back- still pending Per ortho: Will continued daily wound care with soaks of finger in diluted Hibiclens solution - Will continue to follow cultures, currently S.aureus with susceptibilities to follow - May need repeat I&D if no progress in pain and lack of ROM in next few days -follow culture   Recently diagnosed type 2 diabetes mellitus. -lantus/SSI   HIV. We will continue home regimen of Symtuza and Tivicay.   COPD. No exacerbation. Continue home inhaler.   HLD. Continue Crestor.   Hypokalemia -replete  HTN. -resume home meds as needed  DVT prophylaxis: enoxaparin (LOVENOX) injection 40 mg Start: 03/10/22 0800    Code Status: Full Code Family Communication:   Disposition Plan:  Level of care: Med-Surg Status is: inpt    Consultants:  Ortho/hand   Subjective: Pain slightly better- says pain does keep him from moving middle finger  Objective: Vitals:   03/11/22 1708 03/11/22 2235 03/12/22 0614 03/12/22 0746  BP: 121/73 132/77 121/73 113/73  Pulse: 87 80 74 73  Resp: '16 17 16 18  '$ Temp: 99.6 F (37.6 C) 98.2 F (36.8 C) 98.4 F (36.9 C) 98.2 F (36.8 C)  TempSrc: Oral  Oral Oral  SpO2: 95% 94% 98% 96%  Weight:      Height:        Intake/Output Summary (Last 24 hours) at 03/12/2022 1127 Last data filed at 03/11/2022 2200 Gross per 24 hour  Intake 240 ml  Output 300 ml  Net -60 ml   Filed Weights   03/09/22 0858 03/09/22 1415  Weight: 71.2 kg 71.2 kg     Examination:     General: Appearance:    Well developed, well nourished male in no acute distress-hand wrapped, fingers warm     Lungs:     respirations unlabored  Heart:    Normal heart rate.   MS:   All extremities are intact.   Neurologic:   Awake, alert            Data Reviewed: I have personally reviewed following labs and imaging studies  CBC: Recent Labs  Lab 03/09/22 0923 03/10/22 0712 03/11/22 0221  WBC 19.4* 19.6* 15.9*  NEUTROABS 15.9*  --   --   HGB 12.7* 12.2* 11.6*  HCT 36.6* 34.7* 33.7*  MCV 77.7* 77.8* 78.0*  PLT 313 301 174   Basic Metabolic Panel: Recent Labs  Lab 03/09/22 0923 03/10/22 0712 03/11/22 0221  NA 133* 133* 134*  K 3.8 3.6 3.4*  CL 100 98 97*  CO2 '24 24 27  '$ GLUCOSE 240* 114* 130*  BUN '13 8 8  '$ CREATININE 1.02 0.93 1.18  CALCIUM 8.7* 9.0 8.5*   GFR: Estimated Creatinine Clearance: 61.6 mL/min (by C-G formula based on SCr of 1.18 mg/dL). Liver Function Tests: Recent Labs  Lab 03/10/22 0712  AST 19  ALT 18  ALKPHOS 76  BILITOT 0.5  PROT 7.5  ALBUMIN 2.6*   No results for input(s): "LIPASE", "AMYLASE" in the last 168 hours. No  results for input(s): "AMMONIA" in the last 168 hours. Coagulation Profile: No results for input(s): "INR", "PROTIME" in the last 168 hours. Cardiac Enzymes: No results for input(s): "CKTOTAL", "CKMB", "CKMBINDEX", "TROPONINI" in the last 168 hours. BNP (last 3 results) No results for input(s): "PROBNP" in the last 8760 hours. HbA1C: No results for input(s): "HGBA1C" in the last 72 hours. CBG: Recent Labs  Lab 03/11/22 0850 03/11/22 1155 03/11/22 1703 03/11/22 2234 03/12/22 0743  GLUCAP 115* 106* 201* 122* 115*   Lipid Profile: No results for input(s): "CHOL", "HDL", "LDLCALC", "TRIG", "CHOLHDL", "LDLDIRECT" in the last 72 hours. Thyroid Function Tests: No results for input(s): "TSH", "T4TOTAL", "FREET4", "T3FREE", "THYROIDAB" in the last 72 hours. Anemia Panel: No results for  input(s): "VITAMINB12", "FOLATE", "FERRITIN", "TIBC", "IRON", "RETICCTPCT" in the last 72 hours. Sepsis Labs: No results for input(s): "PROCALCITON", "LATICACIDVEN" in the last 168 hours.  Recent Results (from the past 240 hour(s))  Culture, blood (routine x 2)     Status: None (Preliminary result)   Collection Time: 03/09/22  9:10 AM   Specimen: BLOOD  Result Value Ref Range Status   Specimen Description   Final    BLOOD BLOOD LEFT ARM Performed at [facility] [address] [place], [place] [address]    Special Requests   Final    BOTTLES DRAWN AEROBIC AND ANAEROBIC Blood Culture adequate volume Performed at [facility] [address] [place], [place] [address]    Culture   Final    NO GROWTH 3 DAYS Performed at [facility], [place] [address], [place], [place] [address]    Report Status PENDING  Incomplete  Culture, blood (routine x 2)     Status: None (Preliminary result)   Collection Time: 03/09/22  9:15 AM   Specimen: BLOOD LEFT HAND  Result Value Ref Range Status   Specimen Description   Final    BLOOD LEFT HAND Performed at [facility] [address], [place], [place] [address]    Special Requests   Final    BOTTLES DRAWN AEROBIC AND ANAEROBIC Blood Culture results may not be optimal due to an inadequate volume of blood received in culture bottles Performed at [facility] [address], [place], [place] [address]    Culture   Final    NO GROWTH 3 DAYS Performed at [facility], [place] [address] [place], [place] [address]    Report Status PENDING  Incomplete  Aerobic/Anaerobic Culture w Gram Stain (surgical/deep wound)     Status: None (Preliminary result)   Collection Time: 03/09/22  6:04 PM   Specimen: PATH Other; Body Fluid  Result Value Ref Range Status   Specimen Description WOUND  Final   Special Requests RIGHT MIDDLE FINGER  Final   Gram Stain   Final    MODERATE  WBC PRESENT, PREDOMINANTLY MONONUCLEAR FEW GRAM POSITIVE COCCI IN CLUSTERS Performed at [facility], [place] [address], [place], [place] [address]    Culture ABUNDANT STAPHYLOCOCCUS AUREUS  Final   Report Status PENDING  Incomplete   Organism ID, Bacteria STAPHYLOCOCCUS AUREUS  Final      Susceptibility   Staphylococcus aureus - MIC*    CIPROFLOXACIN >=8 RESISTANT Resistant     ERYTHROMYCIN <=0.25 SENSITIVE Sensitive     GENTAMICIN <=0.5 SENSITIVE Sensitive     OXACILLIN 0.5 SENSITIVE Sensitive     TETRACYCLINE <=1 SENSITIVE Sensitive     VANCOMYCIN <=0.5 SENSITIVE Sensitive  TRIMETH/SULFA <=10 SENSITIVE Sensitive     CLINDAMYCIN <=0.25 SENSITIVE Sensitive     RIFAMPIN <=0.5 SENSITIVE Sensitive     Inducible Clindamycin NEGATIVE Sensitive     * ABUNDANT STAPHYLOCOCCUS AUREUS         Radiology Studies: No results found.      Scheduled Meds:  amLODipine  10 mg Oral Daily   aspirin EC  81 mg Oral Daily   chlorhexidine   Topical BID   Darunavir-Cobicistat-Emtricitabine-Tenofovir Alafenamide  1 tablet Oral Q breakfast   docusate sodium  100 mg Oral BID   dolutegravir  50 mg Oral Daily   enoxaparin (LOVENOX) injection  40 mg Subcutaneous Q24H   insulin aspart  0-5 Units Subcutaneous QHS   insulin aspart  0-9 Units Subcutaneous TID WC   insulin glargine-yfgn  8 Units Subcutaneous Q24H   rosuvastatin  10 mg Oral Daily   Continuous Infusions:   ceFAZolin (ANCEF) IV 2 g (03/12/22 0933)   vancomycin 1,500 mg (03/11/22 1002)     LOS: 2 days    Time spent: 45 minutes spent on chart review, discussion with nursing staff, consultants, updating family and interview/physical exam; more than 50% of that time was spent in counseling and/or coordination of care.    Geradine Girt, DO [facility] Available via Epic secure chat 7am-7pm After these hours, please refer to coverage provider listed on [email or web address] 03/12/2022, 11:27 AM

## 2022-03-13 DIAGNOSIS — S61431A Puncture wound without foreign body of right hand, initial encounter: Secondary | ICD-10-CM | POA: Diagnosis not present

## 2022-03-13 DIAGNOSIS — L089 Local infection of the skin and subcutaneous tissue, unspecified: Secondary | ICD-10-CM | POA: Diagnosis not present

## 2022-03-13 DIAGNOSIS — L03113 Cellulitis of right upper limb: Secondary | ICD-10-CM | POA: Diagnosis not present

## 2022-03-13 LAB — CBC
HCT: 31.8 % — ABNORMAL LOW (ref 39.0–52.0)
Hemoglobin: 10.9 g/dL — ABNORMAL LOW (ref 13.0–17.0)
MCH: 26.6 pg (ref 26.0–34.0)
MCHC: 34.3 g/dL (ref 30.0–36.0)
MCV: 77.6 fL — ABNORMAL LOW (ref 80.0–100.0)
Platelets: 337 10*3/uL (ref 150–400)
RBC: 4.1 MIL/uL — ABNORMAL LOW (ref 4.22–5.81)
RDW: 13.7 % (ref 11.5–15.5)
WBC: 8.1 10*3/uL (ref 4.0–10.5)
nRBC: 0 % (ref 0.0–0.2)

## 2022-03-13 LAB — BASIC METABOLIC PANEL
Anion gap: 9 (ref 5–15)
BUN: 9 mg/dL (ref 8–23)
CO2: 26 mmol/L (ref 22–32)
Calcium: 8.6 mg/dL — ABNORMAL LOW (ref 8.9–10.3)
Chloride: 100 mmol/L (ref 98–111)
Creatinine, Ser: 1.02 mg/dL (ref 0.61–1.24)
GFR, Estimated: >60 mL/min (ref >60)
Glucose, Bld: 127 mg/dL — ABNORMAL HIGH (ref 70–99)
Potassium: 3.6 mmol/L (ref 3.5–5.1)
Sodium: 135 mmol/L (ref 135–145)

## 2022-03-13 LAB — GLUCOSE, CAPILLARY
Glucose-Capillary: 132 mg/dL — ABNORMAL HIGH (ref 70–99)
Glucose-Capillary: 116 mg/dL — ABNORMAL HIGH (ref 70–99)
Glucose-Capillary: 266 mg/dL — ABNORMAL HIGH (ref 70–99)
Glucose-Capillary: 111 mg/dL — ABNORMAL HIGH (ref 70–99)

## 2022-03-13 MED ORDER — MORPHINE SULFATE (PF) 2 MG/ML IV SOLN
1.0000 mg | INTRAVENOUS | Status: DC | PRN
  Administered 2022-03-13: 1 mg via INTRAVENOUS
  Filled 2022-03-13: qty 1

## 2022-03-13 NOTE — Progress Notes (Signed)
   Subjective:  No acute events overnight.  Resting comfortably this AM.  Pain seems to be improved.   Objective:   VITALS:   Vitals:   03/12/22 2105 03/13/22 0451 03/13/22 0748 03/13/22 0920  BP: 124/81 116/74 116/74 111/78  Pulse: 81 76 77 70  Resp: '16 18 18   '$ Temp: 98.9 F (37.2 C) 97.6 F (36.4 C) 97.8 F (36.6 C)   TempSrc: Oral Oral    SpO2: 95% 98% 99%   Weight:      Height:        Gen: NAD, resting comfortably Pulm: Normal WOB on RA CV: Normal rate RUE: Dressing clean and dry, scant purulence from middle finger wound, no drainage from dorsum of hand or volar, dorsal hand swelling seems improved with some edema but no fluctuance, still minimal AROM of fingers but no TTP along middle finger distal to proximal phalanx    Lab Results  Component Value Date   WBC 8.1 03/13/2022   HGB 10.9 (L) 03/13/2022   HCT 31.8 (L) 03/13/2022   MCV 77.6 (L) 03/13/2022   PLT 337 03/13/2022     Assessment/Plan:  66 yo M POD 3 s/p I&D of right hand with dorsal middle finger Castaneda  - Continue IV abx, growing MSSA - Continue daily wound care - Please keep NPO at midnight in case patient still not improved, may need repeat I&D   Sherilyn Cooter, MD 03/13/2022, 10:15 AM [phone]

## 2022-03-13 NOTE — Progress Notes (Signed)
PROGRESS NOTE    Ronald Castaneda  GBT:[id number] DOB: [birth year] DOA: 03/09/2022 PCP: Hoyt Koch, MD    Brief Narrative:  Ronald Castaneda is a 66 y.o. male with PMH significant of type 2 diabetes mellitus, depression, HIV infection, active smoker. Patient presented to the hospital with complaints of swelling of his third finger on the right hand. S/p I and D.      Assessment and Plan: Right third finger cellulitis. S/p I/D with hand surgery 10/17 IV abx-- narrowed as culture shows MSSA Per ortho: Will continued daily wound care with soaks of finger in diluted Hibiclens solution - Will continue to follow cultures, currently S.aureus with susceptibilities to follow - May need repeat I&D if no progress in pain and lack of ROM in the AM   Recently diagnosed type 2 diabetes mellitus. -lantus/SSI   HIV. We will continue home regimen of Symtuza and Tivicay.   COPD. No exacerbation. Continue home inhaler.   HLD. Continue Crestor.   Hypokalemia -replete  HTN. -resume home meds as needed  DVT prophylaxis: enoxaparin (LOVENOX) injection 40 mg Start: 03/10/22 0800    Code Status: Full Code  Disposition Plan:  Level of care: Med-Surg Status is: inpt    Consultants:  Ortho/hand   Subjective: Not able to move finger  Objective: Vitals:   03/12/22 2105 03/13/22 0451 03/13/22 0748 03/13/22 0920  BP: 124/81 116/74 116/74 111/78  Pulse: 81 76 77 70  Resp: '16 18 18   '$ Temp: 98.9 F (37.2 C) 97.6 F (36.4 C) 97.8 F (36.6 C)   TempSrc: Oral Oral    SpO2: 95% 98% 99%   Weight:      Height:        Intake/Output Summary (Last 24 hours) at 03/13/2022 1123 Last data filed at 03/13/2022 0800 Gross per 24 hour  Intake 1060.04 ml  Output --  Net 1060.04 ml   Filed Weights   03/09/22 0858 03/09/22 1415  Weight: 71.2 kg 71.2 kg    Examination:      General: Appearance:    Well developed, well nourished male in no acute distress   Hand wrapped   Lungs:     respirations unlabored  Heart:    Normal heart rate.   MS:   All extremities are intact.   Neurologic:   Awake, alert       Data Reviewed: I have personally reviewed following labs and imaging studies  CBC: Recent Labs  Lab 03/09/22 0923 03/10/22 0712 03/11/22 0221 03/13/22 0244  WBC 19.4* 19.6* 15.9* 8.1  NEUTROABS 15.9*  --   --   --   HGB 12.7* 12.2* 11.6* 10.9*  HCT 36.6* 34.7* 33.7* 31.8*  MCV 77.7* 77.8* 78.0* 77.6*  PLT 313 301 292 710   Basic Metabolic Panel: Recent Labs  Lab 03/09/22 0923 03/10/22 0712 03/11/22 0221 03/13/22 0244  NA 133* 133* 134* 135  K 3.8 3.6 3.4* 3.6  CL 100 98 97* 100  CO2 '24 24 27 26  '$ GLUCOSE 240* 114* 130* 127*  BUN '13 8 8 9  '$ CREATININE 1.02 0.93 1.18 1.02  CALCIUM 8.7* 9.0 8.5* 8.6*   GFR: Estimated Creatinine Clearance: 71.2 mL/min (by C-G formula based on SCr of 1.02 mg/dL). Liver Function Tests: Recent Labs  Lab 03/10/22 0712  AST 19  ALT 18  ALKPHOS 76  BILITOT 0.5  PROT 7.5  ALBUMIN 2.6*   No results for input(s): "LIPASE", "AMYLASE" in the last 168 hours. No results for input(s): "AMMONIA"  in the last 168 hours. Coagulation Profile: No results for input(s): "INR", "PROTIME" in the last 168 hours. Cardiac Enzymes: No results for input(s): "CKTOTAL", "CKMB", "CKMBINDEX", "TROPONINI" in the last 168 hours. BNP (last 3 results) No results for input(s): "PROBNP" in the last 8760 hours. HbA1C: No results for input(s): "HGBA1C" in the last 72 hours. CBG: Recent Labs  Lab 03/12/22 0743 03/12/22 1213 03/12/22 1659 03/12/22 2106 03/13/22 0745  GLUCAP 115* 120* 183* 141* 132*   Lipid Profile: No results for input(s): "CHOL", "HDL", "LDLCALC", "TRIG", "CHOLHDL", "LDLDIRECT" in the last 72 hours. Thyroid Function Tests: No results for input(s): "TSH", "T4TOTAL", "FREET4", "T3FREE", "THYROIDAB" in the last 72 hours. Anemia Panel: No results for input(s): "VITAMINB12", "FOLATE", "FERRITIN", "TIBC",  "IRON", "RETICCTPCT" in the last 72 hours. Sepsis Labs: No results for input(s): "PROCALCITON", "LATICACIDVEN" in the last 168 hours.  Recent Results (from the past 240 hour(s))  Culture, blood (routine x 2)     Status: None (Preliminary result)   Collection Time: 03/09/22  9:10 AM   Specimen: BLOOD  Result Value Ref Range Status   Specimen Description   Final    BLOOD BLOOD LEFT ARM Performed at [facility] [address], [place], [place] [address]    Special Requests   Final    BOTTLES DRAWN AEROBIC AND ANAEROBIC Blood Culture adequate volume Performed at [facility] [address], [place], [place] [address]    Culture   Final    NO GROWTH 4 DAYS Performed at [facility], [place] [address], [place], [place] [address]    Report Status PENDING  Incomplete  Culture, blood (routine x 2)     Status: None (Preliminary result)   Collection Time: 03/09/22  9:15 AM   Specimen: BLOOD LEFT HAND  Result Value Ref Range Status   Specimen Description   Final    BLOOD LEFT HAND Performed at [facility] [address] [place], [place] [address]    Special Requests   Final    BOTTLES DRAWN AEROBIC AND ANAEROBIC Blood Culture results may not be optimal due to an inadequate volume of blood received in culture bottles Performed at [facility] [address], [employer] [place], [place] [address]    Culture   Final    NO GROWTH 4 DAYS Performed at [facility], [place] [address] [place], [place] [address]    Report Status PENDING  Incomplete  Aerobic/Anaerobic Culture w Gram Stain (surgical/deep wound)     Status: None (Preliminary result)   Collection Time: 03/09/22  6:04 PM   Specimen: PATH Other; Body Fluid  Result Value Ref Range Status   Specimen Description WOUND  Final   Special Requests RIGHT MIDDLE FINGER  Final   Gram Stain   Final    MODERATE WBC PRESENT, PREDOMINANTLY MONONUCLEAR FEW GRAM  POSITIVE COCCI IN CLUSTERS Performed at [facility], [place] [address] [place], [place] [address]    Culture ABUNDANT STAPHYLOCOCCUS AUREUS  Final   Report Status PENDING  Incomplete   Organism ID, Bacteria STAPHYLOCOCCUS AUREUS  Final      Susceptibility   Staphylococcus aureus - MIC*    CIPROFLOXACIN >=8 RESISTANT Resistant     ERYTHROMYCIN <=0.25 SENSITIVE Sensitive     GENTAMICIN <=0.5 SENSITIVE Sensitive     OXACILLIN 0.5 SENSITIVE Sensitive     TETRACYCLINE <=1 SENSITIVE Sensitive     VANCOMYCIN <=0.5 SENSITIVE Sensitive  TRIMETH/SULFA <=10 SENSITIVE Sensitive     CLINDAMYCIN <=0.25 SENSITIVE Sensitive     RIFAMPIN <=0.5 SENSITIVE Sensitive     Inducible Clindamycin NEGATIVE Sensitive     * ABUNDANT STAPHYLOCOCCUS AUREUS         Radiology Studies: No results found.      Scheduled Meds:  amLODipine  10 mg Oral Daily   aspirin EC  81 mg Oral Daily   chlorhexidine   Topical BID   Darunavir-Cobicistat-Emtricitabine-Tenofovir Alafenamide  1 tablet Oral Q breakfast   docusate sodium  100 mg Oral BID   dolutegravir  50 mg Oral Daily   enoxaparin (LOVENOX) injection  40 mg Subcutaneous Q24H   insulin aspart  0-5 Units Subcutaneous QHS   insulin aspart  0-9 Units Subcutaneous TID WC   insulin glargine-yfgn  8 Units Subcutaneous Q24H   rosuvastatin  10 mg Oral Daily   Continuous Infusions:   ceFAZolin (ANCEF) IV 2 g (03/13/22 0906)     LOS: 3 days    Time spent: 45 minutes spent on chart review, discussion with nursing staff, consultants, updating family and interview/physical exam; more than 50% of that time was spent in counseling and/or coordination of care.    Geradine Girt, DO [facility] Available via Epic secure chat 7am-7pm After these hours, please refer to coverage provider listed on [email or web address] 03/13/2022, 11:23 AM

## 2022-03-14 DIAGNOSIS — L03113 Cellulitis of right upper limb: Secondary | ICD-10-CM | POA: Diagnosis not present

## 2022-03-14 LAB — AEROBIC/ANAEROBIC CULTURE W GRAM STAIN (SURGICAL/DEEP WOUND)

## 2022-03-14 LAB — CULTURE, BLOOD (ROUTINE X 2)
Culture: NO GROWTH
Culture: NO GROWTH
Special Requests: ADEQUATE

## 2022-03-14 LAB — GLUCOSE, CAPILLARY
Glucose-Capillary: 114 mg/dL — ABNORMAL HIGH (ref 70–99)
Glucose-Capillary: 146 mg/dL — ABNORMAL HIGH (ref 70–99)
Glucose-Capillary: 100 mg/dL — ABNORMAL HIGH (ref 70–99)
Glucose-Capillary: 222 mg/dL — ABNORMAL HIGH (ref 70–99)

## 2022-03-14 MED ORDER — CHLORHEXIDINE GLUCONATE 4 % EX LIQD
Freq: Every day | CUTANEOUS | Status: DC
  Filled 2022-03-14 (×5): qty 15

## 2022-03-14 MED ORDER — OXYCODONE HCL 5 MG PO TABS
5.0000 mg | ORAL_TABLET | ORAL | Status: DC | PRN
  Administered 2022-03-14: 5 mg via ORAL
  Administered 2022-03-15 (×2): 10 mg via ORAL
  Filled 2022-03-14: qty 2
  Filled 2022-03-14: qty 1
  Filled 2022-03-14: qty 2

## 2022-03-14 NOTE — Progress Notes (Signed)
   Subjective:  No acute events overnight.  Resting comfortably.  Hand pain continues to improve.   Objective:   VITALS:   Vitals:   03/13/22 1613 03/13/22 2233 03/14/22 0505 03/14/22 0806  BP: 120/70 113/72 106/70 122/75  Pulse: 76 72 71 69  Resp: '17 18 18 18  '$ Temp: 99.5 F (37.5 C) 97.6 F (36.4 C) 98.4 F (36.9 C) 98.2 F (36.8 C)  TempSrc: Oral Oral Oral Oral  SpO2: 97% 98% 97% 97%  Weight:      Height:        Gen: NAD, resting comfortably, pain improving Pulm: Normal WOB on RA CV: BUE warm and well perfused, right hand warm and well perfused R hand: Dressing w/ scant bloody strikethrough, dorsal middle finger wound remains open without overt drainage, swelling continues to improve and dorsal skin wrinkles returned, no pain w/ PROM of IF/RF/SF, improving AROM of middle finger, TTP from PIP to tip of middle finger resolved, minimal and much improved TTP at volar aspect of palm and middle finger   Lab Results  Component Value Date   WBC 8.1 03/13/2022   HGB 10.9 (L) 03/13/2022   HCT 31.8 (L) 03/13/2022   MCV 77.6 (L) 03/13/2022   PLT 337 03/13/2022     Assessment/Plan:  66 yo M POD 4 s/p I&D of right hand with dorsal middle finger abscess.  - Patient continues to improve, albeit slowly - Swelling, erythema, and tenderness continuing to improve - No plans for repeat I&D at this point - Will continue daily soaks with diluted Hibiclens solution and patient instructed on wound care at home   Sherilyn Cooter, MD 03/14/2022, 9:22 AM [phone]

## 2022-03-14 NOTE — Progress Notes (Signed)
PROGRESS NOTE    Ronald Castaneda  WVP:[id number] DOB: [birth year] DOA: 03/09/2022 PCP: Hoyt Koch, MD    Brief Narrative:  Ronald Castaneda is a 66 y.o. male with PMH significant of type 2 diabetes mellitus, depression, HIV infection, active smoker. Patient presented to the hospital with complaints of swelling of his third finger on the right hand. S/p I and D.   Plan to go home in AM   Assessment and Plan: Right third finger cellulitis. S/p I/D with hand surgery 10/17 IV abx-- narrowed as culture shows MSSA Per ortho: Will continued daily wound care with soaks of finger in diluted Hibiclens solution -change to PO abx in AM- cefadroxil 1,000 mg BID 2 weeks from last I and D    Recently diagnosed type 2 diabetes mellitus. -lantus/SSI   HIV. We will continue home regimen of Symtuza and Tivicay.   COPD. No exacerbation. Continue home inhaler.   HLD. Continue Crestor.   Hypokalemia -replete  HTN. -resume home meds as needed  DVT prophylaxis: enoxaparin (LOVENOX) injection 40 mg Start: 03/10/22 0800    Code Status: Full Code  Disposition Plan:  Level of care: Med-Surg Status is: inpt    Consultants:  Ortho/hand   Subjective: No plan for I/D  Objective: Vitals:   03/13/22 1613 03/13/22 2233 03/14/22 0505 03/14/22 0806  BP: 120/70 113/72 106/70 122/75  Pulse: 76 72 71 69  Resp: '17 18 18 18  '$ Temp: 99.5 F (37.5 C) 97.6 F (36.4 C) 98.4 F (36.9 C) 98.2 F (36.8 C)  TempSrc: Oral Oral Oral Oral  SpO2: 97% 98% 97% 97%  Weight:      Height:        Intake/Output Summary (Last 24 hours) at 03/14/2022 1100 Last data filed at 03/13/2022 2048 Gross per 24 hour  Intake 240 ml  Output --  Net 240 ml   Filed Weights   03/09/22 0858 03/09/22 1415  Weight: 71.2 kg 71.2 kg    Examination:   General: Appearance:    Well developed, well nourished male in no acute distress     Lungs:     respirations unlabored  Heart:    Normal heart rate.   MS:    All extremities are intact.   Neurologic:   Awake, alert       Data Reviewed: I have personally reviewed following labs and imaging studies  CBC: Recent Labs  Lab 03/09/22 0923 03/10/22 0712 03/11/22 0221 03/13/22 0244  WBC 19.4* 19.6* 15.9* 8.1  NEUTROABS 15.9*  --   --   --   HGB 12.7* 12.2* 11.6* 10.9*  HCT 36.6* 34.7* 33.7* 31.8*  MCV 77.7* 77.8* 78.0* 77.6*  PLT 313 301 292 546   Basic Metabolic Panel: Recent Labs  Lab 03/09/22 0923 03/10/22 0712 03/11/22 0221 03/13/22 0244  NA 133* 133* 134* 135  K 3.8 3.6 3.4* 3.6  CL 100 98 97* 100  CO2 '24 24 27 26  '$ GLUCOSE 240* 114* 130* 127*  BUN '13 8 8 9  '$ CREATININE 1.02 0.93 1.18 1.02  CALCIUM 8.7* 9.0 8.5* 8.6*   GFR: Estimated Creatinine Clearance: 71.2 mL/min (by C-G formula based on SCr of 1.02 mg/dL). Liver Function Tests: Recent Labs  Lab 03/10/22 0712  AST 19  ALT 18  ALKPHOS 76  BILITOT 0.5  PROT 7.5  ALBUMIN 2.6*   No results for input(s): "LIPASE", "AMYLASE" in the last 168 hours. No results for input(s): "AMMONIA" in the last 168 hours. Coagulation Profile: No  results for input(s): "INR", "PROTIME" in the last 168 hours. Cardiac Enzymes: No results for input(s): "CKTOTAL", "CKMB", "CKMBINDEX", "TROPONINI" in the last 168 hours. BNP (last 3 results) No results for input(s): "PROBNP" in the last 8760 hours. HbA1C: No results for input(s): "HGBA1C" in the last 72 hours. CBG: Recent Labs  Lab 03/13/22 0745 03/13/22 1145 03/13/22 1623 03/13/22 2231 03/14/22 0807  GLUCAP 132* 116* 266* 111* 114*   Lipid Profile: No results for input(s): "CHOL", "HDL", "LDLCALC", "TRIG", "CHOLHDL", "LDLDIRECT" in the last 72 hours. Thyroid Function Tests: No results for input(s): "TSH", "T4TOTAL", "FREET4", "T3FREE", "THYROIDAB" in the last 72 hours. Anemia Panel: No results for input(s): "VITAMINB12", "FOLATE", "FERRITIN", "TIBC", "IRON", "RETICCTPCT" in the last 72 hours. Sepsis Labs: No results for  input(s): "PROCALCITON", "LATICACIDVEN" in the last 168 hours.  Recent Results (from the past 240 hour(s))  Culture, blood (routine x 2)     Status: None   Collection Time: 03/09/22  9:10 AM   Specimen: BLOOD  Result Value Ref Range Status   Specimen Description   Final    BLOOD BLOOD LEFT ARM Performed at [facility] [address] [place], [place] [address]    Special Requests   Final    BOTTLES DRAWN AEROBIC AND ANAEROBIC Blood Culture adequate volume Performed at [facility] [address] [place], [place] [address]    Culture   Final    NO GROWTH 5 DAYS Performed at [facility], [place] [address] [place], [place] [address]    Report Status 03/14/2022 FINAL  Final  Culture, blood (routine x 2)     Status: None   Collection Time: 03/09/22  9:15 AM   Specimen: BLOOD LEFT HAND  Result Value Ref Range Status   Specimen Description   Final    BLOOD LEFT HAND Performed at [facility] [address], [place], [place] [address]    Special Requests   Final    BOTTLES DRAWN AEROBIC AND ANAEROBIC Blood Culture results may not be optimal due to an inadequate volume of blood received in culture bottles Performed at [facility] [address] [place], [place] [address]    Culture   Final    NO GROWTH 5 DAYS Performed at [facility], [place] [address] [place], [place] [address]    Report Status 03/14/2022 FINAL  Final  Aerobic/Anaerobic Culture w Gram Stain (surgical/deep wound)     Status: None (Preliminary result)   Collection Time: 03/09/22  6:04 PM   Specimen: PATH Other; Body Fluid  Result Value Ref Range Status   Specimen Description WOUND  Final   Special Requests RIGHT MIDDLE FINGER  Final   Gram Stain   Final    MODERATE WBC PRESENT, PREDOMINANTLY MONONUCLEAR FEW GRAM POSITIVE COCCI IN CLUSTERS Performed at [facility], [place] [address] [place], [place] [address]     Culture   Final    ABUNDANT STAPHYLOCOCCUS AUREUS NO ANAEROBES ISOLATED; CULTURE IN PROGRESS FOR 5 DAYS    Report Status PENDING  Incomplete   Organism ID, Bacteria STAPHYLOCOCCUS AUREUS  Final      Susceptibility   Staphylococcus aureus - MIC*    CIPROFLOXACIN >=8 RESISTANT Resistant     ERYTHROMYCIN <=0.25 SENSITIVE Sensitive     GENTAMICIN <=0.5 SENSITIVE Sensitive     OXACILLIN 0.5 SENSITIVE Sensitive     TETRACYCLINE <=1 SENSITIVE Sensitive     VANCOMYCIN <=0.5 SENSITIVE Sensitive  TRIMETH/SULFA <=10 SENSITIVE Sensitive     CLINDAMYCIN <=0.25 SENSITIVE Sensitive     RIFAMPIN <=0.5 SENSITIVE Sensitive     Inducible Clindamycin NEGATIVE Sensitive     * ABUNDANT STAPHYLOCOCCUS AUREUS         Radiology Studies: No results found.      Scheduled Meds:  amLODipine  10 mg Oral Daily   aspirin EC  81 mg Oral Daily   chlorhexidine   Topical Daily   Darunavir-Cobicistat-Emtricitabine-Tenofovir Alafenamide  1 tablet Oral Q breakfast   docusate sodium  100 mg Oral BID   dolutegravir  50 mg Oral Daily   enoxaparin (LOVENOX) injection  40 mg Subcutaneous Q24H   insulin aspart  0-5 Units Subcutaneous QHS   insulin aspart  0-9 Units Subcutaneous TID WC   insulin glargine-yfgn  8 Units Subcutaneous Q24H   rosuvastatin  10 mg Oral Daily   Continuous Infusions:   ceFAZolin (ANCEF) IV 2 g (03/14/22 0938)     LOS: 4 days    Time spent: 45 minutes spent on chart review, discussion with nursing staff, consultants, updating family and interview/physical exam; more than 50% of that time was spent in counseling and/or coordination of care.    Geradine Girt, DO [facility] Available via Epic secure chat 7am-7pm After these hours, please refer to coverage provider listed on [email or web address] 03/14/2022, 11:00 AM

## 2022-03-15 ENCOUNTER — Other Ambulatory Visit (HOSPITAL_COMMUNITY): Payer: Self-pay

## 2022-03-15 DIAGNOSIS — Z794 Long term (current) use of insulin: Secondary | ICD-10-CM

## 2022-03-15 DIAGNOSIS — E1169 Type 2 diabetes mellitus with other specified complication: Secondary | ICD-10-CM

## 2022-03-15 DIAGNOSIS — L03113 Cellulitis of right upper limb: Secondary | ICD-10-CM | POA: Diagnosis not present

## 2022-03-15 LAB — GLUCOSE, CAPILLARY
Glucose-Capillary: 136 mg/dL — ABNORMAL HIGH (ref 70–99)
Glucose-Capillary: 116 mg/dL — ABNORMAL HIGH (ref 70–99)
Glucose-Capillary: 232 mg/dL — ABNORMAL HIGH (ref 70–99)

## 2022-03-15 MED ORDER — CHLORHEXIDINE GLUCONATE 4 % EX LIQD
CUTANEOUS | 0 refills | Status: DC
  Filled 2022-03-15: qty 120, fill #0

## 2022-03-15 MED ORDER — CEFADROXIL 500 MG PO CAPS
1.0000 g | ORAL_CAPSULE | Freq: Two times a day (BID) | ORAL | 0 refills | Status: AC
  Filled 2022-03-15: qty 36, 9d supply, fill #0

## 2022-03-15 MED ORDER — INSULIN LISPRO (1 UNIT DIAL) 100 UNIT/ML (KWIKPEN): 10.0000 [IU] | PEN_INJECTOR | Freq: Three times a day (TID) | SUBCUTANEOUS | 11 refills | Status: DC

## 2022-03-15 MED ORDER — OXYCODONE HCL 5 MG PO TABS
5.0000 mg | ORAL_TABLET | ORAL | 0 refills | Status: AC | PRN
  Filled 2022-03-15: qty 30, 5d supply, fill #0

## 2022-03-15 NOTE — Inpatient Diabetes Management (Signed)
Inpatient Diabetes Program Recommendations  AACE/ADA: New Consensus Statement on Inpatient Glycemic Control (2015)  Target Ranges:  Prepandial:   less than 140 mg/dL      Peak postprandial:   less than 180 mg/dL (1-2 hours)      Critically ill patients:  140 - 180 mg/dL   Lab Results  Component Value Date   GLUCAP 116 (H) 03/15/2022   HGBA1C 11.8 (H) 02/26/2022    Review of Glycemic Control  Placed Freestyle Libre 3 on back of L arm prior to discharge. Pt has [employer] on phone. Has more Libres at home. Encouraged pt to make f/u appt with PCP. No other questions or concerns.  Thank you. Lorenda Peck, RD, LDN, [facility] Inpatient Diabetes Coordinator [phone]

## 2022-03-15 NOTE — Discharge Summary (Signed)
Physician Discharge Summary  Ronald Castaneda MBP:[id number] DOB: Dec 27, 1955 DOA: 03/09/2022  PCP: Hoyt Koch, MD  Admit date: 03/09/2022 Discharge date: 03/15/2022  Admitted From: home Discharge disposition: home   Recommendations for Outpatient Follow-Up:   Wound care Ortho follow up   Discharge Diagnosis:   Principal Problem:   Puncture wound of right hand with infection Active Problems:   HIV disease ([place])   Depression   Erectile dysfunction   COPD (chronic obstructive pulmonary disease) ([place])   Hypercholesteremia   Benign hypertension   Smoking   DM (diabetes mellitus) ([place])   Infection of finger    Discharge Condition: Improved.  Diet recommendation: Low sodium, heart healthy.  Carbohydrate-modified.  Regular.  Wound care: see below  Code status: Full.   History of Present Illness:   Ronald Castaneda is a 66 y.o. male with PMH significant of type 2 diabetes mellitus, depression, HIV infection, active smoker. Patient presented to the hospital with complaints of swelling of his third finger on the right hand. S/p I and D.    Hospital Course by Problem:   Right third finger cellulitis. S/p I/D with hand surgery 10/17 -culture shows MSSA Per ortho: Will continued daily wound care with soaks of finger in diluted Hibiclens solution -change to PO abx in AM- cefadroxil 1,000 mg BID 2 weeks from last I and D    Recently diagnosed type 2 diabetes mellitus. -resume home meds   HIV. We will continue home regimen of Symtuza and Tivicay.   COPD. No exacerbation. Continue home inhaler.   HLD. Continue Crestor.   Hypokalemia -repleted   HTN. -holding norvasc for low BP    Medical Consultants:   Ortho (hand)   Discharge Exam:   Vitals:   03/15/22 0455 03/15/22 0824  BP: 110/71 98/64  Pulse: 67 67  Resp: 16 18  Temp: 98.3 F (36.8 C) 98.6 F (37 C)  SpO2: 97% 99%   Vitals:   03/14/22 1605 03/14/22 2001 03/15/22 0455  03/15/22 0824  BP: 119/77 111/75 110/71 98/64  Pulse: 72 75 67 67  Resp: $Remo'18 18 16 18  'OWNnh$ Temp: 98 F (36.7 C) 98.4 F (36.9 C) 98.3 F (36.8 C) 98.6 F (37 C)  TempSrc:  Oral Oral Oral  SpO2: 99% 97% 97% 99%  Weight:      Height:        General exam: Appears calm and comfortable.    The results of significant diagnostics from this hospitalization (including imaging, microbiology, ancillary and laboratory) are listed below for reference.     Procedures and Diagnostic Studies:   CT HAND RIGHT W CONTRAST  Result Date: 03/09/2022 CLINICAL DATA:  Soft tissue infection suspected, hand, xray done EXAM: CT OF THE UPPER RIGHT EXTREMITY WITH CONTRAST TECHNIQUE: Multidetector CT imaging of the upper right extremity was performed according to the standard protocol following intravenous contrast administration. RADIATION DOSE REDUCTION: This exam was performed according to the departmental dose-optimization program which includes automated exposure control, adjustment of the mA and/or kV according to patient size and/or use of iterative reconstruction technique. CONTRAST:  122mL OMNIPAQUE IOHEXOL 300 MG/ML  SOLN COMPARISON:  Hand radiograph 03/09/2022 FINDINGS: Bones/Joint/Cartilage There is a chronic posttraumatic deformity of the fifth metacarpal. There is no evidence of acute fracture. Alignment is normal. 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 No bone erosion or frank bony destruction. No obvious joint effusion. Ligaments Suboptimally assessed by CT. Muscles and Tendons No acute myotendinous abnormality by CT. There is  no obvious tenosynovitis. Soft tissues Diffuse soft tissue swelling of the hand extending into the fingers, most prominently into the middle finger. No soft tissue gas. No focal fluid collection. IMPRESSION: Extensive soft tissue swelling of the hand extending into the fingers, most prominently the middle finger, as can be seen in cellulitis. No evidence of soft tissue abscess, obvious  tenosynovitis, or osseous changes to suggest osteomyelitis by CT. Electronically Signed   By: Maurine Simmering M.D.   On: 03/09/2022 10:36   DG Hand Complete Right  Result Date: 03/09/2022 CLINICAL DATA:  Cellulitis. Inflammation to the right hand and fingers. Swollen and hot to touch. EXAM: RIGHT HAND - COMPLETE 3+ VIEW COMPARISON:  None Available. FINDINGS: There is no evidence of fracture or dislocation. No cortical erosion or periosteal reaction. Contour irregularity of the fifth metacarpal, likely sequela of prior trauma. No significant arthropathy. Marked soft tissue swelling prominent about the dorsum of the hand. IMPRESSION: Marked soft tissue swelling prominent about the dorsum of the hand. No acute fracture or dislocation. No radiographic evidence of osteomyelitis. Electronically Signed   By: Keane Police D.O.   On: 03/09/2022 10:00     Labs:   Basic Metabolic Panel: Recent Labs  Lab 03/09/22 0923 03/10/22 0712 03/11/22 0221 03/13/22 0244  NA 133* 133* 134* 135  K 3.8 3.6 3.4* 3.6  CL 100 98 97* 100  CO2 $Re'24 24 27 26  'Rhi$ GLUCOSE 240* 114* 130* 127*  BUN $Re'13 8 8 9  'YwB$ CREATININE 1.02 0.93 1.18 1.02  CALCIUM 8.7* 9.0 8.5* 8.6*   GFR Estimated Creatinine Clearance: 71.2 mL/min (by C-G formula based on SCr of 1.02 mg/dL). Liver Function Tests: Recent Labs  Lab 03/10/22 0712  AST 19  ALT 18  ALKPHOS 76  BILITOT 0.5  PROT 7.5  ALBUMIN 2.6*   No results for input(s): "LIPASE", "AMYLASE" in the last 168 hours. No results for input(s): "AMMONIA" in the last 168 hours. Coagulation profile No results for input(s): "INR", "PROTIME" in the last 168 hours.  CBC: Recent Labs  Lab 03/09/22 0923 03/10/22 0712 03/11/22 0221 03/13/22 0244  WBC 19.4* 19.6* 15.9* 8.1  NEUTROABS 15.9*  --   --   --   HGB 12.7* 12.2* 11.6* 10.9*  HCT 36.6* 34.7* 33.7* 31.8*  MCV 77.7* 77.8* 78.0* 77.6*  PLT 313 301 292 337   Cardiac Enzymes: No results for input(s): "CKTOTAL", "CKMB", "CKMBINDEX",  "TROPONINI" in the last 168 hours. BNP: Invalid input(s): "POCBNP" CBG: Recent Labs  Lab 03/14/22 0807 03/14/22 1206 03/14/22 1604 03/14/22 2001 03/15/22 0825  GLUCAP 114* 146* 100* 222* 136*   D-Dimer No results for input(s): "DDIMER" in the last 72 hours. Hgb A1c No results for input(s): "HGBA1C" in the last 72 hours. Lipid Profile No results for input(s): "CHOL", "HDL", "LDLCALC", "TRIG", "CHOLHDL", "LDLDIRECT" in the last 72 hours. Thyroid function studies No results for input(s): "TSH", "T4TOTAL", "T3FREE", "THYROIDAB" in the last 72 hours.  Invalid input(s): "FREET3" Anemia work up No results for input(s): "VITAMINB12", "FOLATE", "FERRITIN", "TIBC", "IRON", "RETICCTPCT" in the last 72 hours. Microbiology Recent Results (from the past 240 hour(s))  Culture, blood (routine x 2)     Status: None   Collection Time: 03/09/22  9:10 AM   Specimen: BLOOD  Result Value Ref Range Status   Specimen Description   Final    BLOOD BLOOD LEFT ARM Performed at [facility] [address] [place], [place] [address]    Special Requests   Final  BOTTLES DRAWN AEROBIC AND ANAEROBIC Blood Culture adequate volume Performed at [facility] [address], [place], [place] [address]    Culture   Final    NO GROWTH 5 DAYS Performed at [facility], [place] [address] [place], [place] [address]    Report Status 03/14/2022 FINAL  Final  Culture, blood (routine x 2)     Status: None   Collection Time: 03/09/22  9:15 AM   Specimen: BLOOD LEFT HAND  Result Value Ref Range Status   Specimen Description   Final    BLOOD LEFT HAND Performed at [facility] [address], [place], [place] [address]    Special Requests   Final    BOTTLES DRAWN AEROBIC AND ANAEROBIC Blood Culture results may not be optimal due to an inadequate volume of blood received in culture bottles Performed at [facility]  [address], [place], [place] [address]    Culture   Final    NO GROWTH 5 DAYS Performed at [facility], [place] [address], [place], [place] [address]    Report Status 03/14/2022 FINAL  Final  Aerobic/Anaerobic Culture w Gram Stain (surgical/deep wound)     Status: None   Collection Time: 03/09/22  6:04 PM   Specimen: PATH Other; Body Fluid  Result Value Ref Range Status   Specimen Description WOUND  Final   Special Requests RIGHT MIDDLE FINGER  Final   Gram Stain   Final    MODERATE WBC PRESENT, PREDOMINANTLY MONONUCLEAR FEW GRAM POSITIVE COCCI IN CLUSTERS    Culture   Final    ABUNDANT STAPHYLOCOCCUS AUREUS NO ANAEROBES ISOLATED Performed at [facility], [place] [address] [place],  [address]    Report Status 03/14/2022 FINAL  Final   Organism ID, Bacteria STAPHYLOCOCCUS AUREUS  Final      Susceptibility   Staphylococcus aureus - MIC*    CIPROFLOXACIN >=8 RESISTANT Resistant     ERYTHROMYCIN <=0.25 SENSITIVE Sensitive     GENTAMICIN <=0.5 SENSITIVE Sensitive     OXACILLIN 0.5 SENSITIVE Sensitive     TETRACYCLINE <=1 SENSITIVE Sensitive     VANCOMYCIN <=0.5 SENSITIVE Sensitive     TRIMETH/SULFA <=10 SENSITIVE Sensitive     CLINDAMYCIN <=0.25 SENSITIVE Sensitive     RIFAMPIN <=0.5 SENSITIVE Sensitive     Inducible Clindamycin NEGATIVE Sensitive     * ABUNDANT STAPHYLOCOCCUS AUREUS     Discharge Instructions:   Discharge Instructions     Diet Carb Modified   Complete by: As directed    Discharge wound care:   Complete by: As directed    Soak right hand in warm, diluted Hibiclens solution.  Add 2-3 packs of Hibiclens in warm water.  Soak hand for 10-15 minutes.  Make sure wounds are submerged.  Pat dry and apply clean dressing.   Increase activity slowly   Complete by: As directed       Allergies as of 03/15/2022   No Known Allergies      Medication List     STOP taking these medications    amLODipine 10 MG tablet Commonly known as: NORVASC    cyclobenzaprine 5 MG tablet Commonly known as: FLEXERIL   fluconazole 150 MG tablet Commonly known as: DIFLUCAN   meloxicam 15 MG tablet Commonly known as: MOBIC       TAKE these medications    albuterol 108 (90 Base) MCG/ACT inhaler Commonly known as: VENTOLIN HFA Inhale 2  puffs into the lungs every 6 (six) hours as needed for wheezing or shortness of breath.   aspirin 81 MG chewable tablet Chew 1 tablet (81 mg total) by mouth daily.   BD Pen Needle Nano U/F 32G X 4 MM Misc Generic drug: Insulin Pen Needle use as directed   blood glucose meter kit and supplies Kit Dispense based on patient and insurance preference. Use up to four times daily as directed.   True Metrix Meter w/Device Kit Use up to four times daily as directed.   cefadroxil 500 MG capsule Commonly known as: DURICEF Take 2 capsules (1,000 mg total) by mouth 2 (two) times daily for 9 days.   chlorhexidine 4 % external liquid Commonly known as: HIBICLENS Daily soaks of right hand in warm, diluted Hibiclens solution.   ENSURE ACTIVE PO Take 1 Bottle by mouth as needed (nutritional supplements).   FreeStyle Libre 3 Sensor Misc Place 1 sensor on the skin every 14 days. Use to check glucose continuously   ibuprofen 400 MG tablet Commonly known as: ADVIL Take 400 mg by mouth every 6 (six) hours as needed for mild pain or moderate pain.   insulin glargine 100 UNIT/ML Solostar Pen Commonly known as: LANTUS Inject 18 Units into the skin daily.   insulin lispro 100 UNIT/ML KwikPen Commonly known as: HUMALOG Inject 10 Units into the skin 3 (three) times daily with meals. What changed: how much to take   oxyCODONE 5 MG immediate release tablet Commonly known as: Oxy IR/ROXICODONE Take 1-2 tablets (5-10 mg total) by mouth every 4 (four) hours as needed for up to 5 days for moderate pain.   oxymetazoline 0.05 % nasal spray Commonly known as: AFRIN Place 1 spray into both nostrils 2 (two) times daily  as needed for congestion.   rosuvastatin 10 MG tablet Commonly known as: CRESTOR Take 1 tablet (10 mg total) by mouth daily.   sildenafil 100 MG tablet Commonly known as: VIAGRA Take 1 tablet (100 mg total) by mouth daily as needed for erectile dysfunction.   Symtuza 800-150-200-10 MG Tabs Generic drug: Darunavir-Cobicistat-Emtricitabine-Tenofovir Alafenamide TAKE 1 TABLET BY MOUTH DAILY WITH BREAKFAST.   Tivicay 50 MG tablet Generic drug: dolutegravir TAKE 1 TABLET (50 MG TOTAL) BY MOUTH DAILY. What changed: how much to take   True Metrix Blood Glucose Test test strip Generic drug: glucose blood use as needed to check blood sugars up to 4 times daily   TRUEplus Lancets 28G Misc Use as needed to check blood sugars up to 4 times daily               Discharge Care Instructions  (From admission, onward)           Start     Ordered   03/15/22 0000  Discharge wound care:       Comments: Soak right hand in warm, diluted Hibiclens solution.  Add 2-3 packs of Hibiclens in warm water.  Soak hand for 10-15 minutes.  Make sure wounds are submerged.  Pat dry and apply clean dressing.   03/15/22 0814              Time coordinating discharge: 45  Signed:  Geradine Girt DO  [facility] 03/15/2022, 10:30 AM

## 2022-03-15 NOTE — Plan of Care (Signed)
Problem: Education: Goal: Ability to describe self-care measures that may prevent or decrease complications (Diabetes Survival Skills Education) will improve Outcome: Adequate for Discharge Goal: Individualized Educational Video(s) Outcome: Adequate for Discharge   Problem: Cardiac: Goal: Ability to maintain an adequate cardiac output will improve Outcome: Adequate for Discharge   Problem: Health Behavior/Discharge Planning: Goal: Ability to identify and utilize available resources and services will improve Outcome: Adequate for Discharge Goal: Ability to manage health-related needs will improve Outcome: Adequate for Discharge   Problem: Fluid Volume: Goal: Ability to achieve a balanced intake and output will improve Outcome: Adequate for Discharge   Problem: Metabolic: Goal: Ability to maintain appropriate glucose levels will improve Outcome: Adequate for Discharge   Problem: Nutritional: Goal: Maintenance of adequate nutrition will improve Outcome: Adequate for Discharge Goal: Maintenance of adequate weight for body size and type will improve Outcome: Adequate for Discharge   Problem: Respiratory: Goal: Will regain and/or maintain adequate ventilation Outcome: Adequate for Discharge   Problem: Urinary Elimination: Goal: Ability to achieve and maintain adequate renal perfusion and functioning will improve Outcome: Adequate for Discharge   Problem: Clinical Measurements: Goal: Ability to avoid or minimize complications of infection will improve Outcome: Adequate for Discharge   Problem: Skin Integrity: Goal: Skin integrity will improve Outcome: Adequate for Discharge   Problem: Education: Goal: Ability to describe self-care measures that may prevent or decrease complications (Diabetes Survival Skills Education) will improve Outcome: Adequate for Discharge Goal: Individualized Educational Video(s) Outcome: Adequate for Discharge   Problem: Coping: Goal: Ability  to adjust to condition or change in health will improve Outcome: Adequate for Discharge   Problem: Fluid Volume: Goal: Ability to maintain a balanced intake and output will improve Outcome: Adequate for Discharge   Problem: Health Behavior/Discharge Planning: Goal: Ability to identify and utilize available resources and services will improve Outcome: Adequate for Discharge Goal: Ability to manage health-related needs will improve Outcome: Adequate for Discharge   Problem: Metabolic: Goal: Ability to maintain appropriate glucose levels will improve Outcome: Adequate for Discharge   Problem: Nutritional: Goal: Maintenance of adequate nutrition will improve Outcome: Adequate for Discharge Goal: Progress toward achieving an optimal weight will improve Outcome: Adequate for Discharge   Problem: Skin Integrity: Goal: Risk for impaired skin integrity will decrease Outcome: Adequate for Discharge   Problem: Tissue Perfusion: Goal: Adequacy of tissue perfusion will improve Outcome: Adequate for Discharge   Problem: Education: Goal: Knowledge of General Education information will improve Description: Including pain rating scale, medication(s)/side effects and non-pharmacologic comfort measures Outcome: Adequate for Discharge   Problem: Health Behavior/Discharge Planning: Goal: Ability to manage health-related needs will improve Outcome: Adequate for Discharge   Problem: Clinical Measurements: Goal: Ability to maintain clinical measurements within normal limits will improve Outcome: Adequate for Discharge Goal: Will remain free from infection Outcome: Adequate for Discharge Goal: Diagnostic test results will improve Outcome: Adequate for Discharge Goal: Respiratory complications will improve Outcome: Adequate for Discharge Goal: Cardiovascular complication will be avoided Outcome: Adequate for Discharge   Problem: Activity: Goal: Risk for activity intolerance will  decrease Outcome: Adequate for Discharge   Problem: Nutrition: Goal: Adequate nutrition will be maintained Outcome: Adequate for Discharge   Problem: Coping: Goal: Level of anxiety will decrease Outcome: Adequate for Discharge   Problem: Elimination: Goal: Will not experience complications related to bowel motility Outcome: Adequate for Discharge Goal: Will not experience complications related to urinary retention Outcome: Adequate for Discharge   Problem: Pain Managment: Goal: General experience of comfort will improve  Outcome: Adequate for Discharge   Problem: Safety: Goal: Ability to remain free from injury will improve Outcome: Adequate for Discharge   Problem: Skin Integrity: Goal: Risk for impaired skin integrity will decrease Outcome: Adequate for Discharge

## 2022-03-15 NOTE — Progress Notes (Signed)
Right hand swelling continuing to improve.  Improved TTP at volar and dorsal hand.  No TTP over middle phalanx.  No pain w/ PROM of middle finger PIP joint.  Minimal to no drainage from dorsal finger wound.  66 yo M POD 5 s/p I&D of right hand with dorsal middle finger abscess.  Rec DC on PO abx Can see back in the office in 1 week for wound check.  Sherilyn Cooter, M.D. [facility]

## 2022-03-16 ENCOUNTER — Other Ambulatory Visit (HOSPITAL_COMMUNITY): Payer: Self-pay

## 2022-03-16 ENCOUNTER — Telehealth: Payer: Self-pay

## 2022-03-16 NOTE — Telephone Encounter (Signed)
Transition Care Management Follow-up Telephone Call Date of discharge and from where: [facility] 03-15-22 Dx: puncture wound right hand with infection/ low blood pressure  How have you been since you were released from the hospital? Doing better  Any questions or concerns? No  Items Reviewed: Did the pt receive and understand the discharge instructions provided? Yes  Medications obtained and verified? Yes  Other? No  Any new allergies since your discharge? No  Dietary orders reviewed? Yes Do you have support at home? Yes   Home Care and Equipment/Supplies: Were home health services ordered? no If so, what is the name of the agency? na  Has the agency set up a time to come to the patient's home? not applicable Were any new equipment or medical supplies ordered?  No What is the name of the medical supply agency? na Were you able to get the supplies/equipment? not applicable Do you have any questions related to the use of the equipment or supplies? No  Functional Questionnaire: (I = Independent and D = Dependent) ADLs: I  Bathing/Dressing- I  Meal Prep- I  Eating- I  Maintaining continence- I  Transferring/Ambulation- I  Managing Meds- I  Follow up appointments reviewed:  PCP Hospital f/u appt confirmed? Yes  Scheduled to see Dr Sharlet Salina on 03-23-22 @ [facility] f/u appt confirmed? No  -pt aware to call surgeon and schedule fu appt . Are transportation arrangements needed? No  If their condition worsens, is the pt aware to call PCP or go to the Emergency Dept.? Yes Was the patient provided with contact information for the PCP's office or ED? Yes Was to pt encouraged to call back with questions or concerns? Yes   Juanda Crumble LPN [place] Direct Dial [phone]

## 2022-03-17 ENCOUNTER — Other Ambulatory Visit (HOSPITAL_COMMUNITY): Payer: Self-pay

## 2022-03-18 ENCOUNTER — Other Ambulatory Visit: Payer: Self-pay

## 2022-03-18 ENCOUNTER — Other Ambulatory Visit (HOSPITAL_COMMUNITY): Payer: Self-pay

## 2022-03-18 DIAGNOSIS — B20 Human immunodeficiency virus [HIV] disease: Secondary | ICD-10-CM

## 2022-03-18 NOTE — Progress Notes (Signed)
Patient called due to medication issues with pharmacy. Called pharmacy to confirm medication will be in stock tomorrow. Patient called and informed of medication availability.

## 2022-03-23 ENCOUNTER — Ambulatory Visit (INDEPENDENT_AMBULATORY_CARE_PROVIDER_SITE_OTHER): Payer: No Typology Code available for payment source | Admitting: Internal Medicine

## 2022-03-23 ENCOUNTER — Other Ambulatory Visit (HOSPITAL_COMMUNITY): Payer: Self-pay

## 2022-03-23 ENCOUNTER — Encounter: Payer: Self-pay | Admitting: Internal Medicine

## 2022-03-23 DIAGNOSIS — L03113 Cellulitis of right upper limb: Secondary | ICD-10-CM | POA: Diagnosis not present

## 2022-03-23 MED ORDER — SULFAMETHOXAZOLE-TRIMETHOPRIM 800-160 MG PO TABS
1.0000 | ORAL_TABLET | Freq: Two times a day (BID) | ORAL | 0 refills | Status: DC
  Filled 2022-03-23: qty 14, 7d supply, fill #0

## 2022-03-23 MED ORDER — INSULIN LISPRO (1 UNIT DIAL) 100 UNIT/ML (KWIKPEN): 10.0000 [IU] | PEN_INJECTOR | Freq: Three times a day (TID) | SUBCUTANEOUS | 11 refills | Status: DC

## 2022-03-23 MED ORDER — INSULIN LISPRO (1 UNIT DIAL) 100 UNIT/ML (KWIKPEN)
10.0000 [IU] | PEN_INJECTOR | Freq: Three times a day (TID) | SUBCUTANEOUS | 11 refills | Status: DC
  Filled 2022-03-23: qty 15, 50d supply, fill #0

## 2022-03-23 MED ORDER — SULFAMETHOXAZOLE-TRIMETHOPRIM 800-160 MG PO TABS: 1.0000 | ORAL_TABLET | Freq: Two times a day (BID) | ORAL | 0 refills | Status: DC

## 2022-03-23 NOTE — Patient Instructions (Signed)
We have sent in bactrim to take 1 pill twice a day for 1 week.  We will get you back in with the surgeon to make sure this is healing appropriately.

## 2022-03-23 NOTE — Addendum Note (Signed)
Addended by: Elta Guadeloupe on: 03/23/2022 03:49 PM   Modules accepted: Orders

## 2022-03-23 NOTE — Assessment & Plan Note (Addendum)
Overall improving per patient but still with significant redness and swelling. He is about to finish duricef and I feel he needs another week of antibiotics and will switch to bactrim for better coverage given concurrent diabetes as well as HIV.

## 2022-03-23 NOTE — Progress Notes (Signed)
   Subjective:   Patient ID: Ronald Castaneda, male    DOB: 1955-11-19, 66 y.o.   MRN: [id number]  HPI The patient is a 66 YO man coming in for hospital follow up (in for wound, s/p surgery for exploration and drainage, given antibiotics). He is doing some better. Hand still painful, red and swollen. Swelling decreasing gradually. Mobility is poor in the hand. Sugars are controlled per cgm 14 day average 120s.   PMH, [facility], social history reviewed and updated  Review of Systems  Constitutional:  Positive for activity change.  HENT: Negative.    Eyes: Negative.   Respiratory:  Negative for cough, chest tightness and shortness of breath.   Cardiovascular:  Negative for chest pain, palpitations and leg swelling.  Gastrointestinal:  Negative for abdominal distention, abdominal pain, constipation, diarrhea, nausea and vomiting.  Musculoskeletal:  Positive for arthralgias, joint swelling and myalgias.  Skin: Negative.   Neurological: Negative.   Psychiatric/Behavioral: Negative.      Objective:  Physical Exam Constitutional:      Appearance: He is well-developed.  HENT:     Head: Normocephalic and atraumatic.  Cardiovascular:     Rate and Rhythm: Normal rate and regular rhythm.  Pulmonary:     Effort: Pulmonary effort is normal. No respiratory distress.     Breath sounds: Normal breath sounds. No wheezing or rales.  Abdominal:     General: Bowel sounds are normal. There is no distension.     Palpations: Abdomen is soft.     Tenderness: There is no abdominal tenderness. There is no rebound.  Musculoskeletal:     Cervical back: Normal range of motion.  Skin:    General: Skin is warm and dry.     Comments: Right hand 3rd finger with wound between MCP and PIP. Swelling significant in the finger and lower hand. Redness to the skin   Neurological:     Mental Status: He is alert and oriented to person, place, and time.     Coordination: Coordination normal.     Vitals:   03/23/22 1506   BP: 126/80  Pulse: 76  Temp: 97.6 F (36.4 C)  TempSrc: Oral  SpO2: 96%  Weight: 155 lb (70.3 kg)  Height: '5\' 9"'$  (1.753 m)    Assessment & Plan:  Visit time 20 minutes in face to face communication with patient and coordination of care, additional 10 minutes spent in record review, coordination or care, ordering tests, communicating/referring to other healthcare professionals, documenting in medical records all on the same day of the visit for total time 30 minutes spent on the visit.

## 2022-03-24 ENCOUNTER — Telehealth: Payer: Self-pay | Admitting: Internal Medicine

## 2022-03-24 NOTE — Telephone Encounter (Signed)
Patient is calling about the medication insulin lispro (HUMALOG) 100 UNIT/ML KwikPen. He said the hospital had him using 3 units but that the new script says for 10 units. He wanted to know if he needs to stop using the 3 and only use the 10 or what he needs to do.   He also wants to know if he needs to take sulfamethoxazole-trimethoprim (BACTRIM DS) 800-160 MG tablet and stop taking cefadroxil (DURICEF) 500 MG capsule.  He also wants to update is preferred pharmacy to [id number] [place], [place]   Call back is [phone]

## 2022-03-25 NOTE — Telephone Encounter (Signed)
Keep 3 units with meals. Finish cefadroxil and start taking bactrim as well.

## 2022-03-29 ENCOUNTER — Other Ambulatory Visit: Payer: Self-pay | Admitting: Internal Medicine

## 2022-03-29 ENCOUNTER — Other Ambulatory Visit: Payer: Self-pay

## 2022-03-29 DIAGNOSIS — L03113 Cellulitis of right upper limb: Secondary | ICD-10-CM

## 2022-03-29 DIAGNOSIS — E119 Type 2 diabetes mellitus without complications: Secondary | ICD-10-CM

## 2022-03-29 MED ORDER — INSULIN GLARGINE 100 UNIT/ML SOLOSTAR PEN: 18.0000 [IU] | PEN_INJECTOR | Freq: Every day | SUBCUTANEOUS | 11 refills | Status: DC

## 2022-03-29 NOTE — Progress Notes (Unsigned)
Please advise pt refill.

## 2022-03-29 NOTE — Telephone Encounter (Signed)
No, this should no longer be needed as refill, thanks

## 2022-04-02 ENCOUNTER — Encounter: Payer: Self-pay | Admitting: Family Medicine

## 2022-04-02 ENCOUNTER — Ambulatory Visit (INDEPENDENT_AMBULATORY_CARE_PROVIDER_SITE_OTHER): Payer: No Typology Code available for payment source | Admitting: Family Medicine

## 2022-04-02 VITALS — BP 124/84 | HR 91 | Temp 97.6°F | Ht 69.0 in | Wt 154.0 lb

## 2022-04-02 DIAGNOSIS — T383X5A Adverse effect of insulin and oral hypoglycemic [antidiabetic] drugs, initial encounter: Secondary | ICD-10-CM

## 2022-04-02 DIAGNOSIS — E160 Drug-induced hypoglycemia without coma: Secondary | ICD-10-CM | POA: Insufficient documentation

## 2022-04-02 DIAGNOSIS — L03113 Cellulitis of right upper limb: Secondary | ICD-10-CM

## 2022-04-02 LAB — CBC WITH DIFFERENTIAL/PLATELET
Basophils Absolute: 0 10*3/uL (ref 0.0–0.1)
Basophils Relative: 0.8 % (ref 0.0–3.0)
Eosinophils Absolute: 0.1 10*3/uL (ref 0.0–0.7)
Eosinophils Relative: 1.9 % (ref 0.0–5.0)
HCT: 40.2 % (ref 39.0–52.0)
Hemoglobin: 13.5 g/dL (ref 13.0–17.0)
Lymphocytes Relative: 41.6 % (ref 12.0–46.0)
Lymphs Abs: 2.6 10*3/uL (ref 0.7–4.0)
MCHC: 33.6 g/dL (ref 30.0–36.0)
MCV: 78.9 fl (ref 78.0–100.0)
Monocytes Absolute: 0.7 10*3/uL (ref 0.1–1.0)
Monocytes Relative: 11.1 % (ref 3.0–12.0)
Neutro Abs: 2.8 10*3/uL (ref 1.4–7.7)
Neutrophils Relative %: 44.6 % (ref 43.0–77.0)
Platelets: 315 10*3/uL (ref 150.0–400.0)
RBC: 5.09 Mil/uL (ref 4.22–5.81)
RDW: 15.4 % (ref 11.5–15.5)
WBC: 6.2 10*3/uL (ref 4.0–10.5)

## 2022-04-02 LAB — BASIC METABOLIC PANEL
BUN: 13 mg/dL (ref 6–23)
CO2: 30 mEq/L (ref 19–32)
Calcium: 9.9 mg/dL (ref 8.4–10.5)
Chloride: 97 mEq/L (ref 96–112)
Creatinine, Ser: 1.26 mg/dL (ref 0.40–1.50)
GFR: 59.61 mL/min — ABNORMAL LOW (ref >60.00)
Glucose, Bld: 125 mg/dL — ABNORMAL HIGH (ref 70–99)
Potassium: 4.3 mEq/L (ref 3.5–5.1)
Sodium: 132 mEq/L — ABNORMAL LOW (ref 135–145)

## 2022-04-02 NOTE — Patient Instructions (Addendum)
IF YOUR BLOOD SUGAR IS <100 BEFORE YOU EAT, DO NOT TAKE THE HUMALOG  Reduce your Lantus (the once daily insulin) to 15 units daily.   If you do not have a meal with many carbohydrates (bread, pasta, rice, potatoes) then do not take the meal time insulin (Humalog).  If you do have a fairly heavy meal then you can take the Humalog but only 2 units for now each meal.   Try to eat protein with each meal and eat around the same time for your meals each day.   Never take the Humalog and not eat.   Follow up with Dr. Sharlet Salina in 1-2 weeks or sooner if you have any more low blood sugars.     Hypoglycemia Hypoglycemia is when the sugar (glucose) level in your blood is too low. Low blood sugar can happen to people who have diabetes and people who do not have diabetes. Low blood sugar can happen quickly, and it can be an emergency. What are the causes? This condition happens most often in people who have diabetes. It may be caused by: Diabetes medicine. Not eating enough, or not eating often enough. Doing more physical activity. Drinking alcohol on an empty stomach. If you do not have diabetes, this condition may be caused by: A tumor in the pancreas. Not eating enough, or not eating for long periods at a time (fasting). A very bad infection or illness. Problems after having weight loss (bariatric) surgery. Kidney failure or liver failure. Certain medicines. What increases the risk? This condition is more likely to develop in people who: Have diabetes and take medicines to lower their blood sugar. Abuse alcohol. Have a very bad illness. What are the signs or symptoms? Mild Hunger. Sweating and feeling clammy. Feeling dizzy or light-headed. Being sleepy or having trouble sleeping. Feeling like you may vomit (nauseous). A fast heartbeat. A headache. Blurry vision. Mood changes, such as: Being grouchy. Feeling worried or nervous (anxious). Tingling or loss of feeling (numbness)  around your mouth, lips, or tongue. Moderate Confusion and poor judgment. Behavior changes. Weakness. Uneven heartbeat. Trouble with moving (coordination). Very low Very low blood sugar (severe hypoglycemia) is a medical emergency. It can cause: Fainting. Seizures. Loss of consciousness (coma). Death. How is this treated? Treating low blood sugar Low blood sugar is often treated by eating or drinking something that has sugar in it right away. The food or drink should contain 15 grams of a fast-acting carb (carbohydrate). Options include: 4 oz (120 mL) of fruit juice. 4 oz (120 mL) of regular soda (not diet soda). A few pieces of hard candy. Check food labels to see how many pieces to eat for 15 grams. 1 Tbsp (15 mL) of sugar or honey. 4 glucose tablets. 1 tube of glucose gel. Treating low blood sugar if you have diabetes If you can think clearly and swallow safely, follow the 15:15 rule: Take 15 grams of a fast-acting carb. Talk with your doctor about how much you should take. Always keep a source of fast-acting carb with you, such as: Glucose tablets (take 4 tablets). A few pieces of hard candy. Check food labels to see how many pieces to eat for 15 grams. 4 oz (120 mL) of fruit juice. 4 oz (120 mL) of regular soda (not diet soda). 1 Tbsp (15 mL) of honey or sugar. 1 tube of glucose gel. Check your blood sugar 15 minutes after you take the carb. If your blood sugar is still at or below  70 mg/dL (3.9 mmol/L), take 15 grams of a carb again. If your blood sugar does not go above 70 mg/dL (3.9 mmol/L) after 3 tries, get help right away. After your blood sugar goes back to normal, eat a meal or a snack within 1 hour.  Treating very low blood sugar If your blood sugar is below 54 mg/dL (3 mmol/L), you have very low blood sugar, or severe hypoglycemia. This is an emergency. Get medical help right away. If you have very low blood sugar and you cannot eat or drink, you will need to be  given a hormone called glucagon. A family member or friend should learn how to check your blood sugar and how to give you glucagon. Ask your doctor if you need to have an emergency glucagon kit at home. Very low blood sugar may also need to be treated in a hospital. Follow these instructions at home: General instructions Take over-the-counter and prescription medicines only as told by your doctor. Stay aware of your blood sugar as told by your doctor. If you drink alcohol: Limit how much you have to: 0-1 drink a day for women who are not pregnant. 0-2 drinks a day for men. Know how much alcohol is in your drink. In the [place] one drink equals one 12 oz bottle of beer (355 mL), one 5 oz glass of wine (148 mL), or one 1 oz glass of hard liquor (44 mL). Be sure to eat food when you drink alcohol. Know that your body absorbs alcohol quickly. This may lead to low blood sugar later. Be sure to keep checking your blood sugar. Keep all follow-up visits. If you have diabetes:  Always have a fast-acting carb (15 grams) with you to treat low blood sugar. Follow your diabetes care plan as told by your doctor. Make sure you: Know the symptoms of low blood sugar. Check your blood sugar as often as told. Always check it before and after exercise. Always check your blood sugar before you drive. Take your medicines as told. Follow your meal plan. Eat on time. Do not skip meals. Share your diabetes care plan with: Your work or school. People you live with. Carry a card or wear jewelry that says you have diabetes. Where to find more information American Diabetes Association: www.diabetes.org Contact a doctor if: You have trouble keeping your blood sugar in your target range. You have low blood sugar often. Get help right away if: You still have symptoms after you eat or drink something that contains 15 grams of fast-acting carb, and you cannot get your blood sugar above 70 mg/dL by following the 15:15  rule. Your blood sugar is below 54 mg/dL (3 mmol/L). You have a seizure. You faint. These symptoms may be an emergency. Get help right away. Call your local emergency services (911 in the [place] Do not wait to see if the symptoms will go away. Do not drive yourself to the hospital. Summary Hypoglycemia happens when the level of sugar (glucose) in your blood is too low. Low blood sugar can happen to people who have diabetes and people who do not have diabetes. Low blood sugar can happen quickly, and it can be an emergency. Make sure you know the symptoms of low blood sugar and know how to treat it. Always keep a source of sugar (fast-acting carb) with you to treat low blood sugar. This information is not intended to replace advice given to you by your health care provider. Make sure you discuss  any questions you have with your health care provider. Document Revised: 04/10/2020 Document Reviewed: 04/10/2020 Elsevier Patient Education  [place].

## 2022-04-02 NOTE — Assessment & Plan Note (Addendum)
Reduce Lantus to 15 units daily (down from 18 units) If his blood sugar is less than 100 before he eats then he will not take Humalog.  Otherwise, take Humalog 2 units with meals. Fortunately he has a freestyle libre so it is easy for him to monitor his blood sugar. Discussed eating more protein to stabilize blood sugar and to never take his mealtime insulin if he does not eat. Requesting to switch from insulin to oral medication if possible for his diabetes. Follow-up with PCP in 1 to 2 weeks. Blood sugar currently 115 Check BMP

## 2022-04-02 NOTE — Progress Notes (Signed)
Subjective:     Patient ID: Ronald Castaneda, male    DOB: August 18, 1955, 66 y.o.   MRN: [id number]  Chief Complaint  Patient presents with   Diabetes    Yesterday his BS was going down to 58-60 which Ronald Castaneda says is too low for him and every time Ronald Castaneda would eat some sweets or drink orange juice but highest it got was 100-110 and then kept dropping     Diabetes   Patient is in today for hypoglycemia. States his BS was in the 60s last night and the night before.  Ronald Castaneda is taking 18 units of Lantus daily and Humalog 3 units tid with meals.   States Ronald Castaneda has not eaten breakfast yet.  Blood sugar now with glucose monitor is 111.   Denies fever, chills, dizziness, chest pain, palpitations, shortness of breath, abdominal pain, N/V/D, urinary symptoms, LE edema.   Cellulitis of hand- improving, swelling reduced.      Health Maintenance Due  Topic Date Due   OPHTHALMOLOGY EXAM  Never done   Diabetic kidney evaluation - Urine ACR  Never done   Lung Cancer Screening  04/20/2022    Past Medical History:  Diagnosis Date   Depression    Diabetes mellitus without complication ([place])    HIV infection ([place])    Tobacco use     Past Surgical History:  Procedure Laterality Date   I & D EXTREMITY Right 03/09/2022   Procedure: IRRIGATION AND DEBRIDEMENT OF HAND;  Surgeon: Sherilyn Cooter, MD;  Location: [facility];  Service: Orthopedics;  Laterality: Right;   None      Family History  Problem Relation Age of Onset   Hypertension Mother    Healthy Sister    Healthy Brother    Diabetes type I Daughter     Social History   Socioeconomic History   Marital status: Married    Spouse name: Not on file   Number of children: 4   Years of education: 12   Highest education level: Not on file  Occupational History   Not on file  Tobacco Use   Smoking status: Former    Packs/day: 0.50    Years: 46.00    Total pack years: 23.00    Types: Cigarettes   Smokeless tobacco: Never   Tobacco comments:     stressed at work, unable to decrease at this time; thinking about cutting down "it's getting old"  Vaping Use   Vaping Use: Never used  Substance and Sexual Activity   Alcohol use: Yes    Alcohol/week: 6.0 standard drinks of alcohol    Types: 6 Cans of beer per week    Comment: weekend drinker (3 -4 beers)   Drug use: No   Sexual activity: Yes    Partners: Female    Birth control/protection: Condom    Comment: given condoms  Other Topics Concern   Not on file  Social History Narrative   Lives with wife in a one story home.  Has 4 children.  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  Education: high school.    Social Determinants of Health   Financial Resource Strain: Not on file  Food Insecurity: No Food Insecurity (02/26/2022)   Hunger Vital Sign    Worried About Running Out of Food in the Last Year: Never true    Ran Out of Food in the Last Year: Never true  Transportation Needs: Not on  file  Physical Activity: Not on file  Stress: Not on file  Social Connections: Not on file  Intimate Partner Violence: Not At Risk (02/26/2022)   Humiliation, Afraid, Rape, and Kick questionnaire    Fear of Current or Ex-Partner: No    Emotionally Abused: No    Physically Abused: No    Sexually Abused: No    Outpatient Medications Prior to Visit  Medication Sig Dispense Refill   aspirin 81 MG chewable tablet Chew 1 tablet (81 mg total) by mouth daily. 30 tablet 0   blood glucose meter kit and supplies KIT Dispense based on patient and insurance preference. Use up to four times daily as directed. 1 each 0   blood glucose meter kit and supplies KIT Use up to four times daily as directed. 1 each 0   chlorhexidine (HIBICLENS) 4 % external liquid Daily soaks of right hand in warm, diluted Hibiclens solution. 120 mL 0   Continuous Blood Gluc Sensor (FREESTYLE LIBRE 3 SENSOR) MISC Place 1 sensor on the skin every 14 days. Use to check glucose continuously  2 each 11   Darunavir-Cobicistat-Emtricitabine-Tenofovir Alafenamide (SYMTUZA) 800-150-200-10 MG TABS TAKE 1 TABLET BY MOUTH DAILY WITH BREAKFAST. 30 tablet 11   dolutegravir (TIVICAY) 50 MG tablet TAKE 1 TABLET (50 MG TOTAL) BY MOUTH DAILY. (Patient taking differently: Take 50 mg by mouth daily.) 30 tablet 11   Fingerstix Lancets MISC Use as needed to check blood sugars up to 4 times daily 100 each 0   glucose blood test strip use as needed to check blood sugars up to 4 times daily 100 each 0   ibuprofen (ADVIL) 400 MG tablet Take 400 mg by mouth every 6 (six) hours as needed for mild pain or moderate pain.     insulin glargine (LANTUS) 100 UNIT/ML Solostar Pen Inject 18 Units into the skin daily. 15 mL 11   insulin lispro (HUMALOG) 100 UNIT/ML KwikPen Inject 10 Units into the skin 3 (three) times daily with meals. 15 mL 11   Insulin Pen Needle 32G X 4 MM MISC use as directed 100 each 0   Nutritional Supplements (ENSURE ACTIVE PO) Take 1 Bottle by mouth as needed (nutritional supplements).     oxymetazoline (AFRIN) 0.05 % nasal spray Place 1 spray into both nostrils 2 (two) times daily as needed for congestion.     rosuvastatin (CRESTOR) 10 MG tablet Take 1 tablet (10 mg total) by mouth daily. 90 tablet 3   sildenafil (VIAGRA) 100 MG tablet Take 1 tablet (100 mg total) by mouth daily as needed for erectile dysfunction. 10 tablet 5   sulfamethoxazole-trimethoprim (BACTRIM DS) 800-160 MG tablet Take 1 tablet by mouth 2 (two) times daily. 14 tablet 0   albuterol (VENTOLIN HFA) 108 (90 Base) MCG/ACT inhaler Inhale 2 puffs into the lungs every 6 (six) hours as needed for wheezing or shortness of breath. (Patient not taking: Reported on 04/02/2022) 8 g 2   No facility-administered medications prior to visit.    No Known Allergies  ROS     Objective:    Physical Exam Constitutional:      General: Ronald Castaneda is not in acute distress.    Appearance: Ronald Castaneda is not ill-appearing.  HENT:     Mouth/Throat:      Mouth: Mucous membranes are moist.  Cardiovascular:     Rate and Rhythm: Normal rate.  Pulmonary:     Effort: Pulmonary effort is normal.  Musculoskeletal:     Comments: Clean and dry  dressing on right hand with good sensation and no surrounding erythema or edema.   Skin:    General: Skin is warm and dry.  Neurological:     General: No focal deficit present.     Mental Status: Ronald Castaneda is alert and oriented to person, place, and time.     Motor: No weakness.     Gait: Gait normal.  Psychiatric:        Mood and Affect: Mood normal.        Behavior: Behavior normal.     BP 124/84 (BP Location: Left Arm, Patient Position: Sitting, Cuff Size: Large)   Pulse 91   Temp 97.6 F (36.4 C) (Temporal)   Ht _0  (1.753 m)   Wt 154 lb (69.9 kg)   SpO2 97%   BMI 22.74 kg/m  Wt Readings from Last 3 Encounters:  04/02/22 154 lb (69.9 kg)  03/23/22 155 lb (70.3 kg)  03/09/22 156 lb 15.5 oz (71.2 kg)        Assessment & Plan:   Problem List Items Addressed This Visit       Endocrine   Hypoglycemia due to insulin - Primary    Reduce Lantus to 15 units daily (down from 18 units) If his blood sugar is less than 100 before Ronald Castaneda eats then Ronald Castaneda will not take Humalog.  Otherwise, take Humalog 2 units with meals. Fortunately Ronald Castaneda has a freestyle libre so it is easy for him to monitor his blood sugar. Discussed eating more protein to stabilize blood sugar and to never take his mealtime insulin if Ronald Castaneda does not eat. Requesting to switch from insulin to oral medication if possible for his diabetes. Follow-up with PCP in 1 to 2 weeks. Blood sugar currently 115 Check BMP       Relevant Orders   Basic metabolic panel   CBC with Differential/Platelet     Other   Cellulitis of right hand    Did not remove dressing today.  No sign of infection surrounding dressing. appears to be improving per patient. Check CBC      Relevant Orders   CBC with Differential/Platelet    I am having Senai Willy  maintain his aspirin, oxymetazoline, Symtuza, Tivicay, rosuvastatin, albuterol, sildenafil, blood glucose meter kit and supplies, blood glucose meter kit and supplies, glucose blood, Fingerstix Lancets, Insulin Pen Needle, FreeStyle Libre 3 Sensor, Nutritional Supplements (ENSURE ACTIVE PO), ibuprofen, chlorhexidine, sulfamethoxazole-trimethoprim, insulin lispro, and insulin glargine.  No orders of the defined types were placed in this encounter.

## 2022-04-02 NOTE — Assessment & Plan Note (Addendum)
Did not remove dressing today.  No sign of infection surrounding dressing. appears to be improving per patient. Check CBC

## 2022-04-05 ENCOUNTER — Ambulatory Visit: Payer: 59 | Admitting: Internal Medicine

## 2022-04-09 ENCOUNTER — Inpatient Hospital Stay: Admission: RE | Admit: 2022-04-09 | Payer: 59 | Source: Ambulatory Visit

## 2022-04-14 ENCOUNTER — Ambulatory Visit (INDEPENDENT_AMBULATORY_CARE_PROVIDER_SITE_OTHER): Payer: No Typology Code available for payment source | Admitting: Internal Medicine

## 2022-04-14 ENCOUNTER — Encounter: Payer: Self-pay | Admitting: Internal Medicine

## 2022-04-14 VITALS — BP 112/62 | HR 85 | Temp 98.6°F | Ht 69.0 in | Wt 159.0 lb

## 2022-04-14 DIAGNOSIS — E1169 Type 2 diabetes mellitus with other specified complication: Secondary | ICD-10-CM

## 2022-04-14 DIAGNOSIS — Z794 Long term (current) use of insulin: Secondary | ICD-10-CM | POA: Diagnosis not present

## 2022-04-14 DIAGNOSIS — M79641 Pain in right hand: Secondary | ICD-10-CM

## 2022-04-14 DIAGNOSIS — L03113 Cellulitis of right upper limb: Secondary | ICD-10-CM

## 2022-04-14 MED ORDER — FREESTYLE LIBRE 3 SENSOR MISC: 11 refills | Status: DC

## 2022-04-14 MED ORDER — INSULIN PEN NEEDLE 32G X 4 MM MISC: 11 refills | Status: DC

## 2022-04-14 MED ORDER — TRAMADOL HCL 50 MG PO TABS: 50.0000 mg | ORAL_TABLET | Freq: Two times a day (BID) | ORAL | 0 refills | Status: AC | PRN

## 2022-04-14 NOTE — Assessment & Plan Note (Addendum)
Unclear if type 1 or 2 at this time. We will continue current lantus 15 units daily and humalog 2 units with meals if sugar goes >250 after eating. He has not had any more low sugars since adjustment. He needs to continue using continuous glucose monitoring which is refilled and refilled pen needles for his insulin. Checking fructosamine for control last 6-8 weeks since diagnosis and initiation of insulin.

## 2022-04-14 NOTE — Progress Notes (Signed)
   Subjective:   Patient ID: Ronald Castaneda, male    DOB: 08/20/1955, 66 y.o.   MRN: [id number]  HPI The patient is a 66 YO man coming in for follow up blood sugars. Taking 15 units lantus, using 2 units at meals if needed. Also concerns about hand.  Review of Systems  Constitutional: Negative.   HENT: Negative.    Eyes: Negative.   Respiratory:  Negative for cough, chest tightness and shortness of breath.   Cardiovascular:  Negative for chest pain, palpitations and leg swelling.  Gastrointestinal:  Negative for abdominal distention, abdominal pain, constipation, diarrhea, nausea and vomiting.  Musculoskeletal:  Positive for arthralgias and myalgias.  Skin: Negative.   Neurological: Negative.   Psychiatric/Behavioral: Negative.      Objective:  Physical Exam Constitutional:      Appearance: He is well-developed.  HENT:     Head: Normocephalic and atraumatic.  Cardiovascular:     Rate and Rhythm: Normal rate and regular rhythm.  Pulmonary:     Effort: Pulmonary effort is normal. No respiratory distress.     Breath sounds: Normal breath sounds. No wheezing or rales.  Abdominal:     General: Bowel sounds are normal. There is no distension.     Palpations: Abdomen is soft.     Tenderness: There is no abdominal tenderness. There is no rebound.  Musculoskeletal:        General: Tenderness and deformity present.     Cervical back: Normal range of motion.     Comments: Right hand 3rd finger with lack of ROM and some deformity. Unable to flatten and poor ROM.  Skin:    General: Skin is warm and dry.  Neurological:     Mental Status: He is alert and oriented to person, place, and time.     Coordination: Coordination normal.     Vitals:   04/14/22 0934  BP: 112/62  Pulse: 85  Temp: 98.6 F (37 C)  TempSrc: Oral  SpO2: 95%  Weight: 159 lb (72.1 kg)  Height: '5\' 9"'$  (1.753 m)    Assessment & Plan:  Visit time 20 minutes in face to face communication with patient and  coordination of care, additional 10 minutes spent in record review, coordination or care, ordering tests, communicating/referring to other healthcare professionals, documenting in medical records all on the same day of the visit for total time 30 minutes spent on the visit.

## 2022-04-14 NOTE — Patient Instructions (Addendum)
Call Dr. Madelynn Done office to set up a after surgery visit  [address] [place], [place] [address] Phone: [phone]  We will get you in with physical therapy.   Keep doing 15 units of lantus daily and using 2 units with meals if needed.  We have sent in the refill of the glucose sensors and the needles.  We have sent in tramadol to use up to twice a day for 5 days to help with pain to work with it.   We will get you in with PT for the hand

## 2022-04-14 NOTE — Assessment & Plan Note (Addendum)
The cellulitis is resolved at this time. There is a lack of mobility and pain in the 3rd finger right hand. He did not follow up with surgeon. I am concerned he may have permanent loss of function if we do not start PT at this time. This is ordered and given contact information for surgeon's office I do feel he needs to see them back. He may need further procedure to help with mobilization if he does not progress. Rx tramadol 5 day supply to help with pain during PT.

## 2022-04-19 ENCOUNTER — Other Ambulatory Visit: Payer: Self-pay

## 2022-04-19 ENCOUNTER — Other Ambulatory Visit (HOSPITAL_COMMUNITY): Payer: Self-pay

## 2022-04-19 DIAGNOSIS — B20 Human immunodeficiency virus [HIV] disease: Secondary | ICD-10-CM

## 2022-04-19 MED ORDER — SYMTUZA 800-150-200-10 MG PO TABS
1.0000 | ORAL_TABLET | Freq: Every day | ORAL | 6 refills | Status: DC
  Filled 2022-04-19 (×2): qty 30, 30d supply, fill #0

## 2022-04-19 MED ORDER — TIVICAY 50 MG PO TABS
50.0000 mg | ORAL_TABLET | Freq: Every day | ORAL | 6 refills | Status: DC
  Filled 2022-04-19 (×2): qty 30, 30d supply, fill #0

## 2022-04-20 ENCOUNTER — Telehealth: Payer: Self-pay | Admitting: Internal Medicine

## 2022-04-20 NOTE — Telephone Encounter (Signed)
For our records:   We have received disability PW from [facility] for the Pt and it has been placed in Dr. Dustin Flock box.   Upon completion please fax to: [phone]

## 2022-04-21 NOTE — Telephone Encounter (Signed)
Placed Disability forms inside Dr Sharlet Salina box 11/29

## 2022-04-22 ENCOUNTER — Telehealth: Payer: Self-pay

## 2022-04-23 ENCOUNTER — Other Ambulatory Visit: Payer: Self-pay

## 2022-04-23 ENCOUNTER — Other Ambulatory Visit: Payer: Self-pay | Admitting: Internal Medicine

## 2022-04-23 ENCOUNTER — Telehealth: Payer: Self-pay | Admitting: Internal Medicine

## 2022-04-23 NOTE — Telephone Encounter (Signed)
Forms filled out.

## 2022-04-23 NOTE — Telephone Encounter (Signed)
Sent to Dr Sharlet Salina

## 2022-04-23 NOTE — Telephone Encounter (Signed)
I have faxed and mailed form back to contact information left on documentation for [place]

## 2022-04-23 NOTE — Telephone Encounter (Signed)
Patient called and said he needed a refill on two medications, amladipine and cyclovenvaprin (he had trouble reading them off so I hope spelling is correct). I did not find these medications on his medication list and he also did not have the milligrams for me to note. If there are any questions patient can be contacted at [phone].

## 2022-04-26 NOTE — Therapy (Incomplete)
OUTPATIENT OCCUPATIONAL THERAPY ORTHO EVALUATION  Patient Name: Ronald Castaneda MRN: [id number] DOB:09-28-1955, 66 y.o., male Today's Date: 04/26/2022  PCP: Hoyt Koch, MD  REFERRING PROVIDER: Hoyt Koch, MD   END OF SESSION:   Past Medical History:  Diagnosis Date   Depression    Diabetes mellitus without complication ([place])    HIV infection ([place])    Tobacco use    Past Surgical History:  Procedure Laterality Date   I & D EXTREMITY Right 03/09/2022   Procedure: IRRIGATION AND DEBRIDEMENT OF HAND;  Surgeon: Sherilyn Cooter, MD;  Location: [facility];  Service: Orthopedics;  Laterality: Right;   None     Patient Active Problem List   Diagnosis Date Noted   Hypoglycemia due to insulin 04/02/2022   Infection of finger 03/11/2022   Right hand pain 03/09/2022   Puncture wound of right hand with infection 03/09/2022   DM (diabetes mellitus) ([place]) 03/05/2022   AKI (acute kidney injury) ([place]) 02/26/2022   Back pain 01/22/2022   Neck pain 01/22/2022   Chest wall pain 01/22/2022   Dyspnea on exertion    Atherosclerosis of aorta (H[facility])    Hypercholesteremia    Benign hypertension    Smoking    COPD (chronic obstructive pulmonary disease) ([place]) 04/20/2021   Left hip pain 10/23/2020   Parotid cyst 07/20/2016   Cervical disc disorder with radiculopathy of cervical region 06/17/2016   Parotid nodule 06/17/2016   Erectile dysfunction 09/09/2015   Depression 04/01/2015   HIV disease ([place]) 02/27/2015   Routine general medical examination at a health care facility 05/03/2014   Lipoma of back 05/03/2014   Former smoker 05/03/2014    ONSET DATE: DOS: 03/09/22  REFERRING DIAG: W09.811 (ICD-10-CM) - Cellulitis of right hand   THERAPY DIAG:  No diagnosis found.  Rationale for Evaluation and Treatment: Rehabilitation  SUBJECTIVE:   SUBJECTIVE STATEMENT: He is ***. He states ***    PERTINENT HISTORY: He underwent Rt middle finger dorsal abscess removal and A1  pulley release in October, and apparently did not f/u with hand surgeon or therapy and now has limited mobility with Rt hand, stiffness, etc.     Per order: "Needs hand PT had surgery did not do follow up. Right 3rd finger " "Per MD notes: "The cellulitis is resolved at this time. There is a lack of mobility and pain in the 3rd finger right hand. He did not follow up with surgeon. I am concerned he may have permanent loss of function if we do not start PT at this time. This is ordered and given contact information for surgeon's office I do feel he needs to see them back. He may need further procedure to help with mobilization if he does not progress. Rx tramadol 5 day supply to help with pain during PT. "   PRECAUTIONS: {Therapy precautions:24002}  WEIGHT BEARING RESTRICTIONS: {Yes ***/No:24003}  PAIN:  Are you having pain? *** in *** Rating: ***/10 at rest now, up to ***/10 at worst in past week  FALLS: Has patient fallen in last 6 months? {fallsyesno:27318}  LIVING ENVIRONMENT: Lives with: {OPRC lives with:25569::"lives with their family"} Lives in: {Lives in:25570} Stairs: {opstairs:27293} Has following equipment at home: {Assistive devices:23999}  PLOF: {PLOF:24004}  PATIENT GOALS: ***  NEXT MD VISIT:   OBJECTIVE:   HAND DOMINANCE: Right ***  ADLs: Overall ADLs: States decreased ability to grab, hold household objects, pain and inability to open containers, perform FMS tasks (manipulate fasteners on clothing), mild to moderate bathing  problems as well. ***   FUNCTIONAL OUTCOME MEASURES: Eval: Quck DASH ***% impairment today  (Higher % Score  =  More Impairment)    Patient Specific Functional Scale: *** (***, ***, ***)  (Higher Score  =  Better Ability for the Selected Tasks)     Patient Rated Wrist Evaluation (PRWE): Pain: ***/50; Function: ***/50; Total Score: ***/100 (Higher Score  =  More Pain and/or Debility)    UPPER EXTREMITY ROM     Shoulder to Wrist AROM  Right eval Left eval  Shoulder flexion    Shoulder abduction    Shoulder extension    Shoulder internal rotation    Shoulder external rotation    Elbow flexion    Elbow extension    Forearm supination    Forearm pronation     Wrist flexion    Wrist extension    Wrist ulnar deviation    Wrist radial deviation    Functional dart thrower's motion (F-DTM) in ulnar flexion    F-DTM in radial extension     (Blank rows = not tested)   Hand AROM Right eval Left eval  Full Fist Ability (or Gap to Distal Palmar Crease)    Thumb Opposition to Small Finger (or Gap)    Thumb Opposition to Base of Small Finger (or Gap)     Thumb MCP (0-60)    Thumb IP (0-80)    Thumb Radial abd/add (0-55)     Thumb Palmar abd/add (0-45)     Index MCP (0-90)     Index PIP (0-100)     Index DIP (0-70)      Long MCP (0-90)      Long PIP (0-100)      Long DIP (0-70)      Ring MCP (0-90)      Ring PIP (0-100)      Ring DIP (0-70)      Little MCP (0-90)      Little PIP (0-100)      Little DIP (0-70)      (Blank rows = not tested)   UPPER EXTREMITY MMT:    Eval: *** NT at eval due to recent and still healing injuries. Will be tested when appropriate.   MMT Right TBD Left TBD  Shoulder flexion    Shoulder abduction    Shoulder adduction    Shoulder extension    Shoulder internal rotation    Shoulder external rotation    Middle trapezius    Lower trapezius    Elbow flexion    Elbow extension    Forearm supination    Forearm pronation    Wrist flexion    Wrist extension    Wrist ulnar deviation    Wrist radial deviation    (Blank rows = not tested)  HAND FUNCTION: Eval: Observed weakness in affected hand.  Grip strength Right: *** lbs, Left: *** lbs   COORDINATION: Eval: Observed coordination impairments with affected hand. Box and Blocks Test: *** Blocks today (*** is [facility]); 9 Hole Peg Test Right: ***sec, Left: *** sec (*** sec is WFL)   SENSATION: Eval: *** Light touch intact  today, though diminished around sx area    EDEMA:   Eval: *** Mildly swollen in hand and wrist today, ***cm circumferentially around ***  COGNITION: Eval: Overall cognitive status: WFL for evaluation today ***  OBSERVATIONS:   Eval: ***   TODAY'S TREATMENT:  Post-evaluation treatment: ***    PATIENT EDUCATION: Education details: See tx section above  for details  Person educated: Patient Education method: Verbal Instruction, Teach back, Handouts  Education comprehension: States and demonstrates understanding, Additional Education required    HOME EXERCISE PROGRAM: See tx section above for details    GOALS: Goals reviewed with patient? Yes   SHORT TERM GOALS: (STG required if POC>30 days) Target Date: ***  Pt will obtain protective, custom orthotic. Goal status: TBD/PRN,  MET ***  2.  Pt will demo/state understanding of initial HEP to improve pain levels and prerequisite motion. Goal status: INITIAL   LONG TERM GOALS: Target Date: ***  Pt will improve functional ability by decreased impairment per Quick DASH / PSFS / PRWE assessment from *** to *** or better, for better quality of life. Goal status: INITIAL  2.  Pt will improve grip strength in *** hand from ***lbs to at least ***lbs for functional use at home and in IADLs. Goal status: INITIAL  3.  Pt will improve A/ROM in *** from *** to at least ***, to have functional motion for tasks like reach and grasp.  Goal status: INITIAL  4.  Pt will improve strength in *** from *** MMT to at least *** MMT to have increased functional ability to carry out selfcare and higher-level homecare tasks with no difficulty. Goal status: INITIAL  5.  Pt will improve coordination skills in ***, as seen by better score on *** testing to have increased functional ability to carry out fine motor tasks (fasteners, etc.) and more complex, coordinated IADLs (meal prep, sports, etc.).  Goal status: INITIAL  6.  Pt will decrease pain  at worst from ***/10 to ***/10 or better to have better sleep and occupational participation in daily roles. Goal status: INITIAL   ASSESSMENT:  CLINICAL IMPRESSION: Patient is a 66 y.o. male who was seen today for occupational therapy evaluation for ***.   PERFORMANCE DEFICITS: in functional skills including {OT physical skills:25468}, cognitive skills including {OT cognitive skills:25469}, and psychosocial skills including {OT psychosocial skills:25470}.   IMPAIRMENTS: are limiting patient from {OT performance deficits:25471}.   COMORBIDITIES: {Comorbidities:25485} that affects occupational performance. Patient will benefit from skilled OT to address above impairments and improve overall function.  MODIFICATION OR ASSISTANCE TO COMPLETE EVALUATION: {OT modification:25474}  OT OCCUPATIONAL PROFILE AND HISTORY: {OT PROFILE AND HISTORY:25484}  CLINICAL DECISION MAKING: {OT CDM:25475}  REHAB POTENTIAL: {rehabpotential:25112}  EVALUATION COMPLEXITY: {Evaluation complexity:25115}      PLAN:  OT FREQUENCY: {rehab frequency:25116}  OT DURATION: {rehab duration:25117}  PLANNED INTERVENTIONS: {OT Interventions:25467}  RECOMMENDED OTHER SERVICES: ***  CONSULTED AND AGREED WITH PLAN OF CARE: {EGB:15176}  PLAN FOR NEXT SESSION: ***   Benito Mccreedy, OTR/L, CHT 04/26/2022, 9:19 AM

## 2022-04-27 ENCOUNTER — Encounter: Payer: No Typology Code available for payment source | Admitting: Rehabilitative and Restorative Service Providers"

## 2022-04-27 ENCOUNTER — Other Ambulatory Visit (HOSPITAL_COMMUNITY): Payer: Self-pay

## 2022-05-04 ENCOUNTER — Encounter: Payer: No Typology Code available for payment source | Admitting: Rehabilitative and Restorative Service Providers"

## 2022-05-04 ENCOUNTER — Other Ambulatory Visit: Payer: No Typology Code available for payment source

## 2022-05-05 ENCOUNTER — Other Ambulatory Visit (HOSPITAL_COMMUNITY): Payer: Self-pay

## 2022-05-10 ENCOUNTER — Other Ambulatory Visit: Payer: No Typology Code available for payment source

## 2022-05-10 ENCOUNTER — Telehealth: Payer: Self-pay

## 2022-05-10 NOTE — Telephone Encounter (Signed)
Spoke with patient and he is upset because he has been told 3 different times and date about his hand appointment. I have place his disability forms up front for pickup.

## 2022-05-10 NOTE — Therapy (Signed)
OUTPATIENT OCCUPATIONAL THERAPY ORTHO EVALUATION  Patient Name: Ronald Castaneda MRN: [id number] DOB:1955/10/09, 66 y.o., male Today's Date: 05/11/2022  PCP: Myrlene Broker, MD  REFERRING PROVIDER: Myrlene Broker, MD   END OF SESSION:  OT End of Session - 05/11/22 1300     Visit Number 1    Number of Visits 12    Date for OT Re-Evaluation 06/25/22    Authorization Type [employer]    OT Start Time 1300    OT Stop Time 1351    OT Time Calculation (min) 51 min    Activity Tolerance Patient tolerated treatment well;No increased pain;Patient limited by fatigue;Patient limited by pain    Behavior During Therapy [facility] for tasks assessed/performed             Past Medical History:  Diagnosis Date   Depression    Diabetes mellitus without complication ([facility])    HIV infection ([facility])    Tobacco use    Past Surgical History:  Procedure Laterality Date   I & D EXTREMITY Right 03/09/2022   Procedure: IRRIGATION AND DEBRIDEMENT OF HAND;  Surgeon: Marlyne Beards, MD;  Location: [facility];  Service: Orthopedics;  Laterality: Right;   None     Patient Active Problem List   Diagnosis Date Noted   Hypoglycemia due to insulin 04/02/2022   Infection of finger 03/11/2022   Right hand pain 03/09/2022   Puncture wound of right hand with infection 03/09/2022   DM (diabetes mellitus) (H[facility]) 03/05/2022   AKI (acute kidney injury) ([facility]) 02/26/2022   Back pain 01/22/2022   Neck pain 01/22/2022   Chest wall pain 01/22/2022   Dyspnea on exertion    Atherosclerosis of aorta (HCC)    Hypercholesteremia    Benign hypertension    Smoking    COPD (chronic obstructive pulmonary disease) (HCC) 04/20/2021   Left hip pain 10/23/2020   Parotid cyst 07/20/2016   Cervical disc disorder with radiculopathy of cervical region 06/17/2016   Parotid nodule 06/17/2016   Erectile dysfunction 09/09/2015   Depression 04/01/2015   HIV disease ([facility]) 02/27/2015   Routine general medical  examination at a health care facility 05/03/2014   Lipoma of back 05/03/2014   Former smoker 05/03/2014    ONSET DATE: DOS: 03/09/22  REFERRING DIAG: X32.440 (ICD-10-CM) - Cellulitis of right hand   THERAPY DIAG:  Localized edema  Other lack of coordination  Pain in right hand  Stiffness of right hand, not elsewhere classified  Muscle weakness (generalized)  Rationale for Evaluation and Treatment: Rehabilitation  SUBJECTIVE:   SUBJECTIVE STATEMENT: 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 He states being bitten by something in October, his hand swelled up and then he had to have his surgery.  He was unable to get any therapy after that.  He has been having difficulty using his right hand to do anything including hold objects or open jars or cut up food.  He states using his left hand for things and avoiding use of his right hand now.  He shakes my hand with his left hand.   PERTINENT HISTORY: He underwent Rt middle finger dorsal abscess removal and A1 pulley release in October, and apparently did not f/u with hand surgeon or therapy and now has limited mobility with Rt hand, stiffness, etc.     Per order: "Needs hand PT had surgery did not do follow up. Right 3rd finger " "Per MD notes: "The cellulitis is resolved  at this time. There is a lack of mobility and pain in the 3rd finger right hand. He did not follow up with surgeon. I am concerned he may have permanent loss of function if we do not start PT at this time. This is ordered and given contact information for surgeon's office I do feel he needs to see them back. He may need further procedure to help with mobilization if he does not progress. Rx tramadol 5 day supply to help with pain during PT. "   PRECAUTIONS: None;  WEIGHT BEARING RESTRICTIONS: No  PAIN:  Are you having pain? Not now at rest Rating: 0/10 at rest now  FALLS: Has patient fallen in last 6 months? No  LIVING  ENVIRONMENT: Lives with: lives with their spouse  PLOF: Independent  PATIENT GOALS: To get Rt hand moving better     OBJECTIVE:   HAND DOMINANCE: Right   ADLs: Overall ADLs: States decreased ability to grab, hold household objects, pain and inability to open containers, perform FMS tasks (manipulate fasteners on clothing)   FUNCTIONAL OUTCOME MEASURES: Eval: Quck DASH 32% impairment today  (Higher % Score  =  More Impairment)     UPPER EXTREMITY ROM     Shoulder to Wrist AROM Right eval  Wrist flexion 80  Wrist extension 40  (Blank rows = not tested)  Eval: He cannot close any of the fingers of his right hand to his distal palmar crease in a fist, but middle finger was the stiffest and measurements were taken there as a representation of the rest.  Thumb is moving well. Hand AROM Right eval  Full Fist Ability (or Gap to Distal Palmar Crease) 6cm gap  Thumb Opposition [place] 10  Long MCP (0-90)  0 -61  Long PIP (0-100)  (-27*) - 69  Long DIP (0-70) 0- 31  (Blank rows = not tested)   UPPER EXTREMITY MMT:     MMT Right 05/11/22  Wrist flexion 5/5  Wrist extension 5/5  MF flexion 5/5  MF Ext 5/5  (Blank rows = not tested)  HAND FUNCTION: Eval: Observed weakness in affected hand.  Grip strength Right: 10 lbs, Left: 67 lbs   COORDINATION: Eval: Observed coordination impairments with affected hand. 9 Hole Peg Test Right: 24sec, Left: 21.7sec  SENSATION: Eval:  Light touch intact today  EDEMA:   Eval:  Mildly swollen in MF of Rt hand   COGNITION: Eval: Overall cognitive status: WFL for evaluation today   OBSERVATIONS:   Eval: He has positive as a test of intrinsic tightness in the right hand.  He also shows some possible arthritic changes at the PIP joint.  Surgical areas are well-healed but somewhat adhered with scar tissue.  He is not painful to palpation.   TODAY'S TREATMENT:  Post-evaluation treatment: OT educates on self-care scar mobilization  using nonslip Dycem material, and he tolerates this well.  He is asked to do this at least 3 times a day for 2 minutes at a time.  He is also given education on the following at home exercise program (the bolded were able to be completed today before running out of time).  He demonstrates these back well without added pain states feeling good stretches.  These should help regain some passive and active range of motion, and will needed dressed and reviewed in upcoming sessions  Exercises - Bend and Pull Back Wrist SLOWLY  - 4 x daily - 10-15 reps - Wrist Prayer Stretch  - 4  x daily - 3-5 reps - 15 sec hold - Tendon Glides  - 4-6 x daily - 3-5 reps - 2-3 seconds hold - Hand AROM PIP Blocking  - 4-6 x daily - 10-15 reps - Hand AROM Reverse Blocking  - 4-6 x daily - 10-15 reps - BACK KNUCKLE STRETCHES   - 4 x daily - 3-5 reps - 15 sec hold - HOOK Stretch  - 4 x daily - 3-5 reps - 15-20 sec hold - PUSH KNUCKLES DOWN  - 4 x daily - 3-5 reps - 15 seconds hold - Putty Squeezes  - 4-6 x daily - 1 sets - 10-15 reps   PATIENT EDUCATION: Education details: See tx section above for details  Person educated: Patient Education method: Engineer, structural, Teach back, Handouts  Education comprehension: States and demonstrates understanding, Additional Education required    HOME EXERCISE PROGRAM: Access Code: [email or web address] URL: [email or web address] Date: 05/11/2022 Prepared by: Fannie Knee   GOALS: Goals reviewed with patient? Yes   SHORT TERM GOALS: (STG required if POC>30 days) Target Date: 05/28/22  Pt will obtain protective, custom orthotic for mobilization. Goal status: TBD/PRN  2.  Pt will demo/state understanding of initial HEP to improve pain levels and prerequisite motion. Goal status: INITIAL   LONG TERM GOALS: Target Date: 06/25/22  Pt will improve functional ability by decreased impairment per Quick DASH  assessment from 32% to 10% or better, for better quality of  life. Goal status: INITIAL  2.  Pt will improve grip strength in Rt dom hand from 10lbs to at least 50lbs for functional use at home and in IADLs. Goal status: INITIAL  3.  Pt will improve A/ROM in Rt MF TAM from 134* to at least 200*, to have functional motion for tasks like reach and grasp.  Goal status: INITIAL  4.  Pt will improve coordination skills in Rt hand, as seen by better score on 9HPT testing (to at least 21sec) to have increased functional ability to carry out fine motor tasks (fasteners, etc.) and more complex, coordinated IADLs (meal prep, sports, etc.).  Goal status: INITIAL    ASSESSMENT:  CLINICAL IMPRESSION: Patient is a 66 y.o. male who was seen today for occupational therapy evaluation for weak and contracted right hand status post infection and surgery.  He will benefit from outpatient occupational therapy to increase quality of life.   PERFORMANCE DEFICITS: in functional skills including ADLs, IADLs, coordination, dexterity, ROM, strength, fascial restrictions, flexibility, Fine motor control, Gross motor control, body mechanics, endurance, and UE functional use, cognitive skills including problem solving and safety awareness, and psychosocial skills including coping strategies, habits, and routines and behaviors.   IMPAIRMENTS: are limiting patient from ADLs, IADLs, and work.   COMORBIDITIES: may have co-morbidities  that affects occupational performance. Patient will benefit from skilled OT to address above impairments and improve overall function.  MODIFICATION OR ASSISTANCE TO COMPLETE EVALUATION: No modification of tasks or assist necessary to complete an evaluation.  OT OCCUPATIONAL PROFILE AND HISTORY: Problem focused assessment: Including review of records relating to presenting problem.  CLINICAL DECISION MAKING: Moderate - several treatment options, min-mod task modification necessary  REHAB POTENTIAL: Good  EVALUATION COMPLEXITY:  Low      PLAN:  OT FREQUENCY: 2x/week  OT DURATION: 6 weeks (through 06/25/22 as needed)   PLANNED INTERVENTIONS: self care/ADL training, therapeutic exercise, therapeutic activity, manual therapy, scar mobilization, passive range of motion, electrical stimulation, moist heat, cryotherapy, patient/family education, and coping strategies training  RECOMMENDED OTHER  SERVICES: none now   CONSULTED AND AGREED WITH PLAN OF CARE: Patient  PLAN FOR NEXT SESSION: Review initial home exercise program and scar mobilizations, do manual therapy as needed, educate on remaining home exercise program.  Consider dynamic orthotics to increase finger flexion and extension, also possible night extension orthotic to increase extension of finger at night.   Fannie Knee, OTR/L, CHT 05/11/2022, 5:36 PM

## 2022-05-11 ENCOUNTER — Other Ambulatory Visit: Payer: Self-pay

## 2022-05-11 ENCOUNTER — Encounter: Payer: Self-pay | Admitting: Rehabilitative and Restorative Service Providers"

## 2022-05-11 ENCOUNTER — Ambulatory Visit (INDEPENDENT_AMBULATORY_CARE_PROVIDER_SITE_OTHER): Payer: No Typology Code available for payment source | Admitting: Rehabilitative and Restorative Service Providers"

## 2022-05-11 DIAGNOSIS — M25641 Stiffness of right hand, not elsewhere classified: Secondary | ICD-10-CM | POA: Diagnosis not present

## 2022-05-11 DIAGNOSIS — R600 Localized edema: Secondary | ICD-10-CM | POA: Diagnosis not present

## 2022-05-11 DIAGNOSIS — R278 Other lack of coordination: Secondary | ICD-10-CM | POA: Diagnosis not present

## 2022-05-11 DIAGNOSIS — M79641 Pain in right hand: Secondary | ICD-10-CM | POA: Diagnosis not present

## 2022-05-11 DIAGNOSIS — M6281 Muscle weakness (generalized): Secondary | ICD-10-CM

## 2022-05-12 ENCOUNTER — Other Ambulatory Visit: Payer: Self-pay | Admitting: Internal Medicine

## 2022-05-12 ENCOUNTER — Telehealth: Payer: Self-pay | Admitting: Internal Medicine

## 2022-05-12 NOTE — Telephone Encounter (Signed)
Patient came in and dropped off a return to work form that he needs filled out by Dr. Sharlet Salina. Patient stated that he would like to be called when form is ready. Best callback number is [phone]. Form was placed in Dr. Nathanial Millman box at the front.

## 2022-05-12 NOTE — Telephone Encounter (Signed)
error 

## 2022-05-13 ENCOUNTER — Telehealth: Payer: Self-pay | Admitting: Orthopedic Surgery

## 2022-05-13 NOTE — Telephone Encounter (Signed)
Patient called. Would like a work note stating his restrictions. His call back number is [phone]

## 2022-05-13 NOTE — Telephone Encounter (Signed)
I called and spoke with patient he wanted a note from [place] in OT, however I advised that he needed to contact the surgeon who did his hand surgery for a note with restrictions, that Nate would not be able to provide that for him since he did not do the surgery. I gave him the phone number to [facility] and their address, I advised that he should have followed up with Dr. Tempie Donning already for a post operative appointment, he acted like he knew nothing about it.

## 2022-05-20 ENCOUNTER — Ambulatory Visit: Payer: No Typology Code available for payment source | Admitting: Rehabilitative and Restorative Service Providers"

## 2022-05-20 ENCOUNTER — Encounter: Payer: Self-pay | Admitting: Rehabilitative and Restorative Service Providers"

## 2022-05-20 DIAGNOSIS — R600 Localized edema: Secondary | ICD-10-CM | POA: Diagnosis not present

## 2022-05-20 DIAGNOSIS — M6281 Muscle weakness (generalized): Secondary | ICD-10-CM | POA: Diagnosis not present

## 2022-05-20 DIAGNOSIS — M25641 Stiffness of right hand, not elsewhere classified: Secondary | ICD-10-CM | POA: Diagnosis not present

## 2022-05-20 DIAGNOSIS — R278 Other lack of coordination: Secondary | ICD-10-CM | POA: Diagnosis not present

## 2022-05-20 NOTE — Therapy (Signed)
OUTPATIENT OCCUPATIONAL THERAPY TREATMENT NOTE  Patient Name: Ronald Castaneda MRN: [id number] DOB:Dec 06, 1955, 66 y.o., male Today's Date: 05/20/2022  PCP: Hoyt Koch, MD  REFERRING PROVIDER: Hoyt Koch, MD   END OF SESSION:  OT End of Session - 05/20/22 1149     Visit Number 2    Number of Visits 12    Date for OT Re-Evaluation 06/25/22    Authorization Type [employer]    OT Start Time 1150    OT Stop Time 1232    OT Time Calculation (min) 42 min    Equipment Utilized During Treatment orthotic materials    Activity Tolerance Patient tolerated treatment well;No increased pain;Patient limited by fatigue    Behavior During Therapy [facility] for tasks assessed/performed              Past Medical History:  Diagnosis Date   Depression    Diabetes mellitus without complication ([place])    HIV infection ([place])    Tobacco use    Past Surgical History:  Procedure Laterality Date   I & D EXTREMITY Right 03/09/2022   Procedure: IRRIGATION AND DEBRIDEMENT OF HAND;  Surgeon: Sherilyn Cooter, MD;  Location: [place];  Service: Orthopedics;  Laterality: Right;   None     Patient Active Problem List   Diagnosis Date Noted   Hypoglycemia due to insulin 04/02/2022   Infection of finger 03/11/2022   Right hand pain 03/09/2022   Puncture wound of right hand with infection 03/09/2022   DM (diabetes mellitus) ([place]) 03/05/2022   AKI (acute kidney injury) ([place]) 02/26/2022   Back pain 01/22/2022   Neck pain 01/22/2022   Chest wall pain 01/22/2022   Dyspnea on exertion    Atherosclerosis of aorta (HCC)    Hypercholesteremia    Benign hypertension    Smoking    COPD (chronic obstructive pulmonary disease) ([place]) 04/20/2021   Left hip pain 10/23/2020   Parotid cyst 07/20/2016   Cervical disc disorder with radiculopathy of cervical region 06/17/2016   Parotid nodule 06/17/2016   Erectile dysfunction 09/09/2015   Depression 04/01/2015   HIV disease ([place]) 02/27/2015    Routine general medical examination at a health care facility 05/03/2014   Lipoma of back 05/03/2014   Former smoker 05/03/2014    ONSET DATE: DOS: 03/09/22  REFERRING DIAG: C62.376 (ICD-10-CM) - Cellulitis of right hand   THERAPY DIAG:  Localized edema  Other lack of coordination  Stiffness of right hand, not elsewhere classified  Muscle weakness (generalized)  Rationale for Evaluation and Treatment: Rehabilitation  PERTINENT HISTORY: He underwent Rt middle finger dorsal abscess removal and A1 pulley release in October, and apparently did not f/u with hand surgeon or therapy and now has limited mobility with Rt hand, stiffness, etc.     Per order: "Needs hand PT had surgery did not do follow up. Right 3rd finger " "Per MD notes: "The cellulitis is resolved at this time. There is a lack of mobility and pain in the 3rd finger right hand. He did not follow up with surgeon. I am concerned he may have permanent loss of function if we do not start PT at this time. This is ordered and given contact information for surgeon's office I do feel he needs to see them back. He may need further procedure to help with mobilization if he does not progress. Rx tramadol 5 day supply to help with pain during PT. 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 He states being bitten by something in October, his hand swelled up and then he had to have his surgery.  He was unable to get any therapy after that.  He has been having difficulty using his right hand to do anything including hold objects or open jars or cut up food.  He states using his left hand for things and avoiding use of his right hand now.  He shakes my hand with his left hand."    PRECAUTIONS: None;  WEIGHT BEARING RESTRICTIONS: No  SUBJECTIVE:   SUBJECTIVE STATEMENT: He arrives with paperwork to be filled out for reimbursement for disability purposes and OT gives him the number and email  for medical records department.  These paperwork must be filled out by physician.  He also states trying some of his exercises and not having any pain today.    PAIN:  Are you having pain? Not now at rest  Rating: 0/10 at rest now  PATIENT GOALS: To get Rt hand moving better    OBJECTIVE: (All objective assessments below are from initial evaluation on: 05/11/22 unless otherwise specified.)    HAND DOMINANCE: Right   ADLs: Overall ADLs: States decreased ability to grab, hold household objects, pain and inability to open containers, perform FMS tasks (manipulate fasteners on clothing)   FUNCTIONAL OUTCOME MEASURES: Eval: Quck DASH 32% impairment today  (Higher % Score  =  More Impairment)     UPPER EXTREMITY ROM     Shoulder to Wrist AROM Right eval Rt 05/20/22  Wrist flexion 80   Wrist extension 40 45  (Blank rows = not tested)  Eval: He cannot close any of the fingers of his right hand to his distal palmar crease in a fist, but middle finger was the stiffest and measurements were taken there as a representation of the rest.  Thumb is moving well. Hand AROM Right eval Rt 05/20/22  Full Fist Ability (or Gap to Distal Palmar Crease) 6cm gap   Thumb Opposition Kapandji 10   Long MCP (0-90)  0 -61 0 - 65  Long PIP (0-100)  (-27*) - 69 (-19) - 72  Long DIP (0-70) 0- 31 0 - 43  (Blank rows = not tested)   UPPER EXTREMITY MMT:     MMT Right 05/11/22  Wrist flexion 5/5  Wrist extension 5/5  MF flexion 5/5  MF Ext 5/5  (Blank rows = not tested)  HAND FUNCTION: Eval: Observed weakness in affected hand.  Grip strength Right: 10 lbs, Left: 67 lbs   COORDINATION: Eval: Observed coordination impairments with affected hand. 9 Hole Peg Test Right: 24sec, Left: 21.7sec  SENSATION: Eval:  Light touch intact today  EDEMA:   Eval:  Mildly swollen in MF of Rt hand   OBSERVATIONS:   Eval: He has positive as a test of intrinsic tightness in the right hand.  He also shows  some possible arthritic changes at the PIP joint.  Surgical areas are well-healed but somewhat adhered with scar tissue.  He is not painful to palpation.   TODAY'S TREATMENT:  05/20/22: OT reviews initial home exercise program, going over each exercise as below.  He demonstrates each 1 back feeling stretches but no additional pain.  He also does new range of motion for measurements which show an improvement in total motion at the wrist and finger.  OT fabricates custom night middle finger extension orthotic to increase extension of finger at night.  It  fits well without pain and he feels a stretch into extension.  He is told to take off if he cannot sleep or is having increased pain or problems.  He states understanding  Exercises - Bend and Pull Back Wrist SLOWLY  - 4 x daily - 10-15 reps - Wrist Prayer Stretch  - 4 x daily - 3-5 reps - 15 sec hold - Tendon Glides  - 4-6 x daily - 3-5 reps - 2-3 seconds hold - Hand AROM PIP Blocking  - 4-6 x daily - 10-15 reps - Hand AROM Reverse Blocking  - 4-6 x daily - 10-15 reps - BACK KNUCKLE STRETCHES   - 4 x daily - 3-5 reps - 15 sec hold - HOOK Stretch  - 4 x daily - 3-5 reps - 15-20 sec hold - PUSH KNUCKLES DOWN  - 4 x daily - 3-5 reps - 15 seconds hold - Putty Squeezes  - 4-6 x daily - 1 sets - 10-15 reps  PATIENT EDUCATION: Education details: See tx section above for details  Person educated: Patient Education method: Veterinary surgeon, Teach back, Handouts  Education comprehension: States and demonstrates understanding, Additional Education required    HOME EXERCISE PROGRAM: Access Code: [email or web address] URL: [email or web address] Date: 05/11/2022 Prepared by: Benito Mccreedy   GOALS: Goals reviewed with patient? Yes   SHORT TERM GOALS: (STG required if POC>30 days) Target Date: 05/28/22  Pt will obtain protective, custom orthotic for mobilization. Goal status: Partially Met 05/20/22- now has extension orthotic for night  use  2.  Pt will demo/state understanding of initial HEP to improve pain levels and prerequisite motion. Goal status: INITIAL   LONG TERM GOALS: Target Date: 06/25/22  Pt will improve functional ability by decreased impairment per Quick DASH  assessment from 32% to 10% or better, for better quality of life. Goal status: INITIAL  2.  Pt will improve grip strength in Rt dom hand from 10lbs to at least 50lbs for functional use at home and in IADLs. Goal status: INITIAL  3.  Pt will improve A/ROM in Rt MF TAM from 134* to at least 200*, to have functional motion for tasks like reach and grasp.  Goal status: INITIAL  4.  Pt will improve coordination skills in Rt hand, as seen by better score on 9HPT testing (to at least 21sec) to have increased functional ability to carry out fine motor tasks (fasteners, etc.) and more complex, coordinated IADLs (meal prep, sports, etc.).  Goal status: INITIAL    ASSESSMENT:  CLINICAL IMPRESSION: 05/20/22: MF TAM is up to 161* now from 134*.  He is making progress and OT will continue per POC.  He does continue to not shake hands on the right, which shows a bad habit that needs to change and is likely holding back his motion and ability.   PLAN:  OT FREQUENCY: 2x/week  OT DURATION: 6 weeks (through 06/25/22 as needed)   PLANNED INTERVENTIONS: self care/ADL training, therapeutic exercise, therapeutic activity, manual therapy, scar mobilization, passive range of motion, electrical stimulation, moist heat, cryotherapy, patient/family education, and coping strategies training  CONSULTED AND AGREED WITH PLAN OF CARE: Patient  PLAN FOR NEXT SESSION:  Review HEP, do manual therapy and scar mobilizations as needed.  Also do functional activities to force use of right hand.  Continue to consider dynamic flexion orthotic if regaining motion becomes stubborn.   Benito Mccreedy, OTR/L, CHT 05/20/2022, 12:42 PM

## 2022-05-20 NOTE — Therapy (Signed)
OUTPATIENT OCCUPATIONAL THERAPY TREATMENT NOTE  Patient Name: Ronald Castaneda MRN: [id number] DOB:[birth year], 66 y.o., male Today's Date: 05/25/2022  PCP: Hoyt Koch, MD  REFERRING PROVIDER: Hoyt Koch, MD   END OF SESSION:  OT End of Session - 05/25/22 1148     Visit Number 3    Number of Visits 12    Date for OT Re-Evaluation 06/25/22    Authorization Type [employer]    OT Start Time 1148    OT Stop Time 1253    OT Time Calculation (min) 65 min    Equipment Utilized During Treatment orthotic materials    Activity Tolerance Patient tolerated treatment well;No increased pain;Patient limited by fatigue    Behavior During Therapy [facility] for tasks assessed/performed               Past Medical History:  Diagnosis Date   Depression    Diabetes mellitus without complication ([place])    HIV infection ([place])    Tobacco use    Past Surgical History:  Procedure Laterality Date   I & D EXTREMITY Right 03/09/2022   Procedure: IRRIGATION AND DEBRIDEMENT OF HAND;  Surgeon: Sherilyn Cooter, MD;  Location: [place];  Service: Orthopedics;  Laterality: Right;   None     Patient Active Problem List   Diagnosis Date Noted   Hypoglycemia due to insulin 04/02/2022   Infection of finger 03/11/2022   Right hand pain 03/09/2022   Puncture wound of right hand with infection 03/09/2022   DM (diabetes mellitus) ([place]) 03/05/2022   AKI (acute kidney injury) ([place]) 02/26/2022   Back pain 01/22/2022   Neck pain 01/22/2022   Chest wall pain 01/22/2022   Dyspnea on exertion    Atherosclerosis of aorta ([facility])    Hypercholesteremia    Benign hypertension    Smoking    COPD (chronic obstructive pulmonary disease) ([place]) 04/20/2021   Left hip pain 10/23/2020   Parotid cyst 07/20/2016   Cervical disc disorder with radiculopathy of cervical region 06/17/2016   Parotid nodule 06/17/2016   Erectile dysfunction 09/09/2015   Depression 04/01/2015   HIV disease ([place]) 02/27/2015    Routine general medical examination at a health care facility 05/03/2014   Lipoma of back 05/03/2014   Former smoker 05/03/2014    ONSET DATE: DOS: 03/09/22  REFERRING DIAG: V78.588 (ICD-10-CM) - Cellulitis of right hand   THERAPY DIAG:  Localized edema  Other lack of coordination  Stiffness of right hand, not elsewhere classified  Pain in right hand  Muscle weakness (generalized)  Rationale for Evaluation and Treatment: Rehabilitation  PERTINENT HISTORY: He underwent Rt middle finger dorsal abscess removal and A1 pulley release in October, and apparently did not f/u with hand surgeon or therapy and now has limited mobility with Rt hand, stiffness, etc.     Per order: "Needs hand PT had surgery did not do follow up. Right 3rd finger " "Per MD notes: "The cellulitis is resolved at this time. There is a lack of mobility and pain in the 3rd finger right hand. He did not follow up with surgeon. I am concerned he may have permanent loss of function if we do not start PT at this time. This is ordered and given contact information for surgeon's office I do feel he needs to see them back. He may need further procedure to help with mobilization if he does not progress. Rx tramadol 5 day supply to help with pain during PT. 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 He states being bitten by something in October, his hand swelled up and then he had to have his surgery.  He was unable to get any therapy after that.  He has been having difficulty using his right hand to do anything including hold objects or open jars or cut up food.  He states using his left hand for things and avoiding use of his right hand now.  He shakes my hand with his left hand."    PRECAUTIONS: None;  WEIGHT BEARING RESTRICTIONS: No  SUBJECTIVE:   SUBJECTIVE STATEMENT: He states feeling like his hand is looser and feeling better. The night brace did work to straighten  his finger, but did make it feel temporarily more stiff to bend after he took it off.  He used heat and exercise program he was given to increase flexion after that.   PAIN:  Are you having pain?  Not now at rest  Rating: 0/10 at rest now  PATIENT GOALS: To get Rt hand moving better    OBJECTIVE: (All objective assessments below are from initial evaluation on: 05/11/22 unless otherwise specified.)    HAND DOMINANCE: Right   ADLs: Overall ADLs: States decreased ability to grab, hold household objects, pain and inability to open containers, perform FMS tasks (manipulate fasteners on clothing)   FUNCTIONAL OUTCOME MEASURES: Eval: Quck DASH 32% impairment today  (Higher % Score  =  More Impairment)     UPPER EXTREMITY ROM     Shoulder to Wrist AROM Right eval Rt 05/20/22  Wrist flexion 80   Wrist extension 40 45  (Blank rows = not tested)  Eval: He cannot close any of the fingers of his right hand to his distal palmar crease in a fist, but middle finger was the stiffest and measurements were taken there as a representation of the rest.  Thumb is moving well. Hand AROM Right eval Rt 05/20/22 Rt 05/25/22  Full Fist Ability (or Gap to Distal Palmar Crease) 6cm gap    Thumb Opposition Kapandji 10    Long MCP (0-90)  0 -61 0 - 65 0 - 61 (Post manual: 0- 69)   Long PIP (0-100)  (-27*) - 69 (-19) - 72 (-18) - 74 (Post manual: (-20)- 76)   Long DIP (0-70) 0- 31 0 - 43 0- 43 (Post manual: 0- 40)   (Blank rows = not tested)   UPPER EXTREMITY MMT:     MMT Right 05/11/22  Wrist flexion 5/5  Wrist extension 5/5  MF flexion 5/5  MF Ext 5/5  (Blank rows = not tested)  HAND FUNCTION: Eval: Observed weakness in affected hand.  Grip strength Right: 10 lbs, Left: 67 lbs   COORDINATION: Eval: Observed coordination impairments with affected hand. 9 Hole Peg Test Right: 24sec, Left: 21.7sec  SENSATION: Eval:  Light touch intact today  EDEMA:   Eval:  Mildly swollen in MF of Rt  hand   OBSERVATIONS:   Eval: He has positive as a test of intrinsic tightness in the right hand.  He also shows some possible arthritic changes at the PIP joint.  Surgical areas are well-healed but somewhat adhered with scar tissue.  He is not painful to palpation.   TODAY'S TREATMENT:  05/25/22: He did not bring night orthotics for adjustment, states it does not need adjustment.  OT uses moist heat on him for 3 minutes with review of home exercise stretches.  After that OT does 10 minutes of stretching on him, index finger middle finger ring finger at the MCP joints IP joints and in composite flexion.  Also small joint mobilizations.  After this active range of motion measurements were checked again and he made about 7 degrees again in toto motion after manual therapy compared to before manual therapy today.  This is a version of the modified weeks test which demonstrates that he would benefit from orthotic mobilization of his stiff hand.  OT then fabricates a dynamic finger flexion orthotic and includes digits 2 3 and 4 to help mobilize his stiff right hand.  This it takes a while to construct as it is dynamic in nature, but at the end it fit well he states feeling a good stretch without any pain while wearing.  He was asked to wear it 2-3 times a day for 15-minute periods at least.  He was also asked to continue home exercise program stretches and night extension orthotic as well.  He states understanding and he will return this Thursday for new motion check.  To increase grip strength and use of right hand, he also does green flex bar pronation, supination, wrist flexion with concurrent grasp x 10 each, feeling mild fatigue at the end.  PATIENT EDUCATION: Education details: See tx section above for details  Person educated: Patient Education method: Verbal Instruction, Teach back, Handouts  Education comprehension: States and demonstrates understanding, Additional Education required    HOME  EXERCISE PROGRAM: Access Code: [email or web address] URL: [email or web address] Date: 05/11/2022 Prepared by: Benito Mccreedy   GOALS: Goals reviewed with patient? Yes   SHORT TERM GOALS: (STG required if POC>30 days) Target Date: 05/28/22  Pt will obtain protective, custom orthotic for mobilization. Goal status: 05/25/22: MET  2.  Pt will demo/state understanding of initial HEP to improve pain levels and prerequisite motion. Goal status: INITIAL   LONG TERM GOALS: Target Date: 06/25/22  Pt will improve functional ability by decreased impairment per Quick DASH  assessment from 32% to 10% or better, for better quality of life. Goal status: INITIAL  2.  Pt will improve grip strength in Rt dom hand from 10lbs to at least 50lbs for functional use at home and in IADLs. Goal status: INITIAL  3.  Pt will improve A/ROM in Rt MF TAM from 134* to at least 200*, to have functional motion for tasks like reach and grasp.  Goal status: INITIAL  4.  Pt will improve coordination skills in Rt hand, as seen by better score on 9HPT testing (to at least 21sec) to have increased functional ability to carry out fine motor tasks (fasteners, etc.) and more complex, coordinated IADLs (meal prep, sports, etc.).  Goal status: INITIAL    ASSESSMENT:  CLINICAL IMPRESSION: 05/25/22: The modified weeks test performed during this session shows that he can make progress from manual stretches, just needs to be consistent.  It also shows that he would benefit from an orthotic, which she did have fabricated today.  OT will monitor the use of this new dynamic orthotic and how it affects his function in upcoming sessions.  05/20/22: MF TAM is up to 161* now from 134*.  He is making progress and OT will continue per POC.  He does continue to not shake hands on the right, which shows a bad habit that needs to change and is likely holding back his motion and ability.   PLAN:  OT FREQUENCY: 2x/week  OT DURATION: 6  weeks (through 06/25/22  as needed)   PLANNED INTERVENTIONS: self care/ADL training, therapeutic exercise, therapeutic activity, manual therapy, scar mobilization, passive range of motion, electrical stimulation, moist heat, cryotherapy, patient/family education, and coping strategies training  CONSULTED AND AGREED WITH PLAN OF CARE: Patient  PLAN FOR NEXT SESSION:  Check new dynamic orthotic and night orthotic as needed, continue modalities and manual therapy as indicated, continue functional activities and strength for the right hand.  Motion checks and grip checks    Benito Mccreedy, OTR/L, CHT 05/25/2022, 1:34 PM

## 2022-05-25 ENCOUNTER — Ambulatory Visit (INDEPENDENT_AMBULATORY_CARE_PROVIDER_SITE_OTHER): Payer: No Typology Code available for payment source | Admitting: Rehabilitative and Restorative Service Providers"

## 2022-05-25 ENCOUNTER — Encounter: Payer: Self-pay | Admitting: Rehabilitative and Restorative Service Providers"

## 2022-05-25 DIAGNOSIS — M25641 Stiffness of right hand, not elsewhere classified: Secondary | ICD-10-CM | POA: Diagnosis not present

## 2022-05-25 DIAGNOSIS — R278 Other lack of coordination: Secondary | ICD-10-CM | POA: Diagnosis not present

## 2022-05-25 DIAGNOSIS — M6281 Muscle weakness (generalized): Secondary | ICD-10-CM

## 2022-05-25 DIAGNOSIS — M79641 Pain in right hand: Secondary | ICD-10-CM | POA: Diagnosis not present

## 2022-05-25 DIAGNOSIS — R600 Localized edema: Secondary | ICD-10-CM

## 2022-05-25 NOTE — Therapy (Signed)
OUTPATIENT OCCUPATIONAL THERAPY TREATMENT NOTE  Patient Name: Ronald Castaneda MRN: [id number] DOB:04/06/1956, 67 y.o., male Today's Date: 05/27/2022  PCP: Hoyt Koch, MD  REFERRING PROVIDER: Hoyt Koch, MD   END OF SESSION:  OT End of Session - 05/27/22 1146     Visit Number 4    Number of Visits 12    Date for OT Re-Evaluation 06/25/22    Authorization Type [employer]    OT Start Time 1146    OT Stop Time 3491    OT Time Calculation (min) 36 min    Activity Tolerance Patient tolerated treatment well;No increased pain;Patient limited by fatigue    Behavior During Therapy [facility] for tasks assessed/performed                Past Medical History:  Diagnosis Date   Depression    Diabetes mellitus without complication ([place])    HIV infection ([place])    Tobacco use    Past Surgical History:  Procedure Laterality Date   I & D EXTREMITY Right 03/09/2022   Procedure: IRRIGATION AND DEBRIDEMENT OF HAND;  Surgeon: Sherilyn Cooter, MD;  Location: [place];  Service: Orthopedics;  Laterality: Right;   None     Patient Active Problem List   Diagnosis Date Noted   Hypoglycemia due to insulin 04/02/2022   Infection of finger 03/11/2022   Right hand pain 03/09/2022   Puncture wound of right hand with infection 03/09/2022   DM (diabetes mellitus) ([place]) 03/05/2022   AKI (acute kidney injury) ([place]) 02/26/2022   Back pain 01/22/2022   Neck pain 01/22/2022   Chest wall pain 01/22/2022   Dyspnea on exertion    Atherosclerosis of aorta (HCC)    Hypercholesteremia    Benign hypertension    Smoking    COPD (chronic obstructive pulmonary disease) ([place]) 04/20/2021   Left hip pain 10/23/2020   Parotid cyst 07/20/2016   Cervical disc disorder with radiculopathy of cervical region 06/17/2016   Parotid nodule 06/17/2016   Erectile dysfunction 09/09/2015   Depression 04/01/2015   HIV disease ([place]) 02/27/2015   Routine general medical examination at a health care  facility 05/03/2014   Lipoma of back 05/03/2014   Former smoker 05/03/2014    ONSET DATE: DOS: 03/09/22  REFERRING DIAG: P91.505 (ICD-10-CM) - Cellulitis of right hand   THERAPY DIAG:  Localized edema  Other lack of coordination  Stiffness of right hand, not elsewhere classified  Muscle weakness (generalized)  Pain in right hand  Rationale for Evaluation and Treatment: Rehabilitation  PERTINENT HISTORY: He underwent Rt middle finger dorsal abscess removal and A1 pulley release in October, and apparently did not f/u with hand surgeon or therapy and now has limited mobility with Rt hand, stiffness, etc.     Per order: "Needs hand PT had surgery did not do follow up. Right 3rd finger " "Per MD notes: "The cellulitis is resolved at this time. There is a lack of mobility and pain in the 3rd finger right hand. He did not follow up with surgeon. I am concerned he may have permanent loss of function if we do not start PT at this time. This is ordered and given contact information for surgeon's office I do feel he needs to see them back. He may need further procedure to help with mobilization if he does not progress. Rx tramadol 5 day supply to help with pain during PT. 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 He states being bitten by something in October, his hand swelled up and then he had to have his surgery.  He was unable to get any therapy after that.  He has been having difficulty using his right hand to do anything including hold objects or open jars or cut up food.  He states using his left hand for things and avoiding use of his right hand now.  He shakes my hand with his left hand."    PRECAUTIONS: None;  WEIGHT BEARING RESTRICTIONS: No  SUBJECTIVE:   SUBJECTIVE STATEMENT: He states new orthotic is working well, not causing irritation, he did not bring it in for any adjustment.  PAIN:  Are you having pain?  Not now at rest   Rating: 0/10 at rest now  PATIENT GOALS: To get Rt hand moving better    OBJECTIVE: (All objective assessments below are from initial evaluation on: 05/11/22 unless otherwise specified.)    HAND DOMINANCE: Right   ADLs: Overall ADLs: States decreased ability to grab, hold household objects, pain and inability to open containers, perform FMS tasks (manipulate fasteners on clothing)   FUNCTIONAL OUTCOME MEASURES: Eval: Quck DASH 32% impairment today  (Higher % Score  =  More Impairment)     UPPER EXTREMITY ROM     Shoulder to Wrist AROM Right eval Rt 05/20/22  Wrist flexion 80   Wrist extension 40 45  (Blank rows = not tested)  Eval: He cannot close any of the fingers of his right hand to his distal palmar crease in a fist, but middle finger was the stiffest and measurements were taken there as a representation of the rest.  Thumb is moving well. Hand AROM Right eval Rt 05/20/22 Rt 05/25/22 Rt 05/27/22  Full Fist Ability (or Gap to Distal Palmar Crease) 6cm gap     Thumb Opposition [place] 10     Long MCP (0-90)  0 -61 0 - 65 0 - 61 (Post manual: 0- 69)  0 - 61  Long PIP (0-100)  (-27*) - 69 (-19) - 72 (-18) - 74 (Post manual: (-20)- 76)  (-18) - 74  Long DIP (0-70) 0- 31 0 - 43 0- 43 (Post manual: 0- 40)  0 - 48  (Blank rows = not tested)   UPPER EXTREMITY MMT:     MMT Right 05/11/22  Wrist flexion 5/5  Wrist extension 5/5  MF flexion 5/5  MF Ext 5/5  (Blank rows = not tested)  HAND FUNCTION: Eval: Observed weakness in affected hand.  Grip strength Right: 10 lbs, Left: 67 lbs   COORDINATION: Eval: Observed coordination impairments with affected hand. 9 Hole Peg Test Right: 24sec, Left: 21.7sec  EDEMA:   Eval:  Mildly swollen in MF of Rt hand   OBSERVATIONS:   Eval: He has positive as a test of intrinsic tightness in the right hand.  He also shows some possible arthritic changes at the PIP joint.  Surgical areas are well-healed but somewhat adhered with  scar tissue.  He is not painful to palpation.   TODAY'S TREATMENT:  05/27/22: He performs AROM at start that shows 5* improvement over start of last session.  OT reviews his home exercise program and updates that to include composite finger and wrist flexion stretches which he states understanding.  He performs back blocking exercises and stretches, and OT does manual therapy scar mobilization and manual stretches with joint mobilizations to MCP and PIP joints.  He tolerates  all well with slight discomfort but no pain at the end of session.  He states new dynamic orthotic is working well and not causing irritation, and so he did not bring it in for any adjustments today.  Exercises - Wrist Prayer Stretch  - 4 x daily - 3-5 reps - 15 sec hold - Wrist Flexion Stretch  - 4 x daily - 3-5 reps - 15 sec hold - Tendon Glides  - 4-6 x daily - 3-5 reps - 2-3 seconds hold - Hand AROM PIP Blocking  - 4-6 x daily - 10-15 reps - Hand AROM Reverse Blocking  - 4-6 x daily - 10-15 reps - BACK KNUCKLE STRETCHES   - 4 x daily - 3-5 reps - 15 sec hold - HOOK Stretch  - 4 x daily - 3-5 reps - 15-20 sec hold - PUSH KNUCKLES DOWN  - 4 x daily - 3-5 reps - 15 seconds hold - Putty Squeezes  - 4-6 x daily - 1 sets - 10-15 reps   PATIENT EDUCATION: Education details: See tx section above for details  Person educated: Patient Education method: Veterinary surgeon, Teach back, Handouts  Education comprehension: States and demonstrates understanding, Additional Education required    HOME EXERCISE PROGRAM: Access Code: [email or web address] URL: [email or web address] Date: 05/11/2022 Prepared by: Benito Mccreedy   GOALS: Goals reviewed with patient? Yes   SHORT TERM GOALS: (STG required if POC>30 days) Target Date: 05/28/22  Pt will obtain protective, custom orthotic for mobilization. Goal status: 05/25/22: MET  2.  Pt will demo/state understanding of initial HEP to improve pain levels and prerequisite  motion. Goal status: 05/27/22: MET   LONG TERM GOALS: Target Date: 06/25/22  Pt will improve functional ability by decreased impairment per Quick DASH  assessment from 32% to 10% or better, for better quality of life. Goal status: INITIAL  2.  Pt will improve grip strength in Rt dom hand from 10lbs to at least 50lbs for functional use at home and in IADLs. Goal status: INITIAL  3.  Pt will improve A/ROM in Rt MF TAM from 134* to at least 200*, to have functional motion for tasks like reach and grasp.  Goal status: INITIAL  4.  Pt will improve coordination skills in Rt hand, as seen by better score on 9HPT testing (to at least 21sec) to have increased functional ability to carry out fine motor tasks (fasteners, etc.) and more complex, coordinated IADLs (meal prep, sports, etc.).  Goal status: INITIAL    ASSESSMENT:  CLINICAL IMPRESSION: 05/27/22: Continue plan of care as he is progressing, though somewhat slowly.  We will monitor out 1 week now to see if his exercise program and use of orthotic is helping.    PLAN:  OT FREQUENCY: 2x/week  OT DURATION: 6 weeks (through 06/25/22 as needed)   PLANNED INTERVENTIONS: self care/ADL training, therapeutic exercise, therapeutic activity, manual therapy, scar mobilization, passive range of motion, electrical stimulation, moist heat, cryotherapy, patient/family education, and coping strategies training  CONSULTED AND AGREED WITH PLAN OF CARE: Patient  PLAN FOR NEXT SESSION:  Check knowledge of home exercise program motion strength and use of orthotic.  Benito Mccreedy, OTR/L, CHT 05/27/2022, 12:31 PM

## 2022-05-26 ENCOUNTER — Telehealth: Payer: Self-pay | Admitting: Internal Medicine

## 2022-05-26 NOTE — Telephone Encounter (Signed)
PT visits today in need of proof of their diagnosis of diabetes. I had PT fill out the proper forms to get their medical records sent to them.  PT did have me check on the exact date of their diagnosis. They believed it was during their visit that sent them to the hospital on 01/07/2022 but I could not see this on my end. I informed PT that we would get back up with him once we had exact date of diagnosis.  CB: [phone]

## 2022-05-27 ENCOUNTER — Ambulatory Visit (INDEPENDENT_AMBULATORY_CARE_PROVIDER_SITE_OTHER): Payer: No Typology Code available for payment source | Admitting: Rehabilitative and Restorative Service Providers"

## 2022-05-27 DIAGNOSIS — M79641 Pain in right hand: Secondary | ICD-10-CM

## 2022-05-27 DIAGNOSIS — M25641 Stiffness of right hand, not elsewhere classified: Secondary | ICD-10-CM | POA: Diagnosis not present

## 2022-05-27 DIAGNOSIS — M6281 Muscle weakness (generalized): Secondary | ICD-10-CM | POA: Diagnosis not present

## 2022-05-27 DIAGNOSIS — R600 Localized edema: Secondary | ICD-10-CM

## 2022-05-27 DIAGNOSIS — R278 Other lack of coordination: Secondary | ICD-10-CM

## 2022-05-27 NOTE — Telephone Encounter (Signed)
That was date of diagnosis

## 2022-05-27 NOTE — Telephone Encounter (Signed)
I spoke with patient and also printed out his AVS for that date and he will also be picking up his disability forms as well for his hand surgeon to fill out. Informed him if there will be anything else needed from Dr Sharlet Salina he will need to schedule a visit. Left papers up front for him to pick up

## 2022-05-31 NOTE — Therapy (Signed)
OUTPATIENT OCCUPATIONAL THERAPY TREATMENT NOTE  Patient Name: Keona Sheffler MRN: [id number] DOB:1956/01/08, 67 y.o., male Today's Date: 06/01/2022  PCP: Hoyt Koch, MD  REFERRING PROVIDER: Hoyt Koch, MD   END OF SESSION:  OT End of Session - 06/01/22 1138     Visit Number 5    Number of Visits 12    Date for OT Re-Evaluation 06/25/22    Authorization Type [employer]    OT Start Time 1139    OT Stop Time 1233    OT Time Calculation (min) 54 min    Activity Tolerance Patient tolerated treatment well;No increased pain;Patient limited by fatigue;Patient limited by pain    Behavior During Therapy [facility] for tasks assessed/performed              Past Medical History:  Diagnosis Date   Depression    Diabetes mellitus without complication ([place])    HIV infection ([place])    Tobacco use    Past Surgical History:  Procedure Laterality Date   I & D EXTREMITY Right 03/09/2022   Procedure: IRRIGATION AND DEBRIDEMENT OF HAND;  Surgeon: Sherilyn Cooter, MD;  Location: [place];  Service: Orthopedics;  Laterality: Right;   None     Patient Active Problem List   Diagnosis Date Noted   Hypoglycemia due to insulin 04/02/2022   Infection of finger 03/11/2022   Right hand pain 03/09/2022   Puncture wound of right hand with infection 03/09/2022   DM (diabetes mellitus) ([place]) 03/05/2022   AKI (acute kidney injury) ([place]) 02/26/2022   Back pain 01/22/2022   Neck pain 01/22/2022   Chest wall pain 01/22/2022   Dyspnea on exertion    Atherosclerosis of aorta (HCC)    Hypercholesteremia    Benign hypertension    Smoking    COPD (chronic obstructive pulmonary disease) ([place]) 04/20/2021   Left hip pain 10/23/2020   Parotid cyst 07/20/2016   Cervical disc disorder with radiculopathy of cervical region 06/17/2016   Parotid nodule 06/17/2016   Erectile dysfunction 09/09/2015   Depression 04/01/2015   HIV disease ([place]) 02/27/2015   Routine general medical  examination at a health care facility 05/03/2014   Lipoma of back 05/03/2014   Former smoker 05/03/2014    ONSET DATE: DOS: 03/09/22  REFERRING DIAG: G31.517 (ICD-10-CM) - Cellulitis of right hand   THERAPY DIAG:  Localized edema  Other lack of coordination  Pain in right hand  Muscle weakness (generalized)  Stiffness of right hand, not elsewhere classified  Rationale for Evaluation and Treatment: Rehabilitation  PERTINENT HISTORY: He underwent Rt middle finger dorsal abscess removal and A1 pulley release in October, and apparently did not f/u with hand surgeon or therapy and now has limited mobility with Rt hand, stiffness, etc.     Per order: "Needs hand PT had surgery did not do follow up. Right 3rd finger " "Per MD notes: "The cellulitis is resolved at this time. There is a lack of mobility and pain in the 3rd finger right hand. He did not follow up with surgeon. I am concerned he may have permanent loss of function if we do not start PT at this time. This is ordered and given contact information for surgeon's office I do feel he needs to see them back. He may need further procedure to help with mobilization if he does not progress. Rx tramadol 5 day supply to help with pain during PT. 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 He states being bitten by something in October, his hand swelled up and then he had to have his surgery.  He was unable to get any therapy after that.  He has been having difficulty using his right hand to do anything including hold objects or open jars or cut up food.  He states using his left hand for things and avoiding use of his right hand now.  He shakes my hand with his left hand."    PRECAUTIONS: None;  WEIGHT BEARING RESTRICTIONS: No  SUBJECTIVE:   SUBJECTIVE STATEMENT: He states wearing his orthotic and trying to do exercises.  He also states seeing the hand surgeon Dr. Tempie Donning yesterday who  will "keep him out of work for 1 month."  He does state continued concern for returning to work with limited use of right hand for forklift driving.   PAIN:  Are you having pain?  Not now at rest  Rating: 0/10 at rest now  PATIENT GOALS: To get Rt hand moving better    OBJECTIVE: (All objective assessments below are from initial evaluation on: 05/11/22 unless otherwise specified.)    HAND DOMINANCE: Right   ADLs: Overall ADLs: States decreased ability to grab, hold household objects, pain and inability to open containers, perform FMS tasks (manipulate fasteners on clothing)   FUNCTIONAL OUTCOME MEASURES: Eval: Quck DASH 32% impairment today  (Higher % Score  =  More Impairment)     UPPER EXTREMITY ROM     Shoulder to Wrist AROM Right eval Rt 05/20/22 Rt 06/01/22  Wrist flexion 80  76  Wrist extension 40 45 58  (Blank rows = not tested)  Eval: He cannot close any of the fingers of his right hand to his distal palmar crease in a fist, but middle finger was the stiffest and measurements were taken there as a representation of the rest.  Thumb is moving well. Hand AROM Right eval Rt 05/20/22 Rt 05/25/22 Rt 05/27/22 Rt 06/01/22  Full Fist Ability (or Gap to Distal Palmar Crease) 6cm gap      Thumb Opposition Kapandji 10      Long MCP (0-90)  0 -61 0 - 65 0 - 61 (Post manual: 0- 69)  0 - 61 0 - 62 (76 post manual therapy)  Long PIP (0-100)  (-27*) - 69 (-19) - 72 (-18) - 74 (Post manual: (-20)- 76)  (-18) - 74 (-20) - 73 (72post manual)  Long DIP (0-70) 0- 31 0 - 43 0- 43 (Post manual: 0- 40)  0 - 48 0 - 43 (47 post manual)  (Blank rows = not tested)   UPPER EXTREMITY MMT:     MMT Right 05/11/22  Wrist flexion 5/5  Wrist extension 5/5  MF flexion 5/5  MF Ext 5/5  (Blank rows = not tested)  HAND FUNCTION: 06/01/22:  12.8# Rt hand now   Eval: Observed weakness in affected hand.  Grip strength Right: 10 lbs, Left: 67 lbs   COORDINATION: Eval: Observed coordination  impairments with affected hand. 9 Hole Peg Test Right: 24sec, Left: 21.7sec  EDEMA:   Eval:  Mildly swollen in MF of Rt hand   OBSERVATIONS:   Eval: He has positive as a test of intrinsic tightness in the right hand.  He also shows some possible arthritic changes at the PIP joint.  Surgical areas are well-healed but somewhat adhered with scar tissue.  He is not painful to palpation.   TODAY'S TREATMENT:  06/01/22: His active range of motion is not much changed since last session, but after manual therapy is applied it does improve by almost 20 degrees.  Manual therapy today includes IASTM and scar mobilization techniques as well as vibration to volar and dorsal scars around the middle finger.  It also includes stretches to the MCP and IP joints of the middle finger as well as PNF stretches with patient active resistance to try to increase tendon gliding forces.  OT then goes over home exercise program with him having him demonstrate back blocking exercises, wrist stretches and gripping into therapy putty which is given out today as opposed to using his grip ball at home.  Additionally he performs active gripping and wrist flexion with green flex bar 3 reps with resistance held for 5 to 15 seconds.  He was educated to do PNF stretches on himself within his dynamic flexion orthotic as tolerated.  He was asked to bring in his orthotic next session to determine its effect and adjust as needed.  He was also told that he could wear this more often in the day up to 6 or 8 times for 15-minute periods as possible as well as long as not getting to sore.  He was also educated on new ideas for compensation (there act education) to increase grip size on objects as well as wear nonslip work Biomedical scientist to increase grip size and get a better grasp on objects.  These things will be trialed in upcoming sessions with resistive activities as tolerated.  He states understanding instructions for all    PATIENT  EDUCATION: Education details: See tx section above for details  Person educated: Patient Education method: Verbal Instruction, Teach back, Handouts  Education comprehension: States and demonstrates understanding, Additional Education required    HOME EXERCISE PROGRAM: Access Code: [email or web address] URL: [email or web address]    GOALS: Goals reviewed with patient? Yes   SHORT TERM GOALS: (STG required if POC>30 days) Target Date: 05/28/22  Pt will obtain protective, custom orthotic for mobilization. Goal status: 05/25/22: MET  2.  Pt will demo/state understanding of initial HEP to improve pain levels and prerequisite motion. Goal status: 05/27/22: MET   LONG TERM GOALS: Target Date: 06/25/22  Pt will improve functional ability by decreased impairment per Quick DASH  assessment from 32% to 10% or better, for better quality of life. Goal status: INITIAL  2.  Pt will improve grip strength in Rt dom hand from 10lbs to at least 50lbs for functional use at home and in IADLs. Goal status: INITIAL  3.  Pt will improve A/ROM in Rt MF TAM from 134* to at least 200*, to have functional motion for tasks like reach and grasp.  Goal status: INITIAL  4.  Pt will improve coordination skills in Rt hand, as seen by better score on 9HPT testing (to at least 21sec) to have increased functional ability to carry out fine motor tasks (fasteners, etc.) and more complex, coordinated IADLs (meal prep, sports, etc.).  Goal status: INITIAL    ASSESSMENT:  CLINICAL IMPRESSION: 06/01/22: He may not get full range of motion back due to overt scar adhesions and likely internal flexor and extensor adhesions, but we will also focus on some compensatory techniques.  He does make gains with stretches and manual techniques in sessions, and if he is consistent with stretches and exercises at home they should show as a positive result and range of motion measures.  If range of motion is not improving much this  would be suspicious for poor follow-through with home exercise program or orthotic wear.    PLAN:  OT FREQUENCY: 2x/week  OT DURATION: 6 weeks (through 06/25/22 as needed)   PLANNED INTERVENTIONS: self care/ADL training, therapeutic exercise, therapeutic activity, manual therapy, scar mobilization, passive range of motion, electrical stimulation, moist heat, cryotherapy, patient/family education, and coping strategies training  CONSULTED AND AGREED WITH PLAN OF CARE: Patient  PLAN FOR NEXT SESSION:  Continue with manual therapy and modalities, trying to break up scars internally and externally as able.  Perform more active hook fist to increase tendon gliding differential between FDS and FDP, continue with functional resistive activities with modified and compensatory techniques as well.   Benito Mccreedy, OTR/L, CHT 06/01/2022, 2:59 PM

## 2022-06-01 ENCOUNTER — Encounter: Payer: Self-pay | Admitting: Rehabilitative and Restorative Service Providers"

## 2022-06-01 ENCOUNTER — Ambulatory Visit (INDEPENDENT_AMBULATORY_CARE_PROVIDER_SITE_OTHER): Payer: No Typology Code available for payment source | Admitting: Rehabilitative and Restorative Service Providers"

## 2022-06-01 DIAGNOSIS — M79641 Pain in right hand: Secondary | ICD-10-CM

## 2022-06-01 DIAGNOSIS — R278 Other lack of coordination: Secondary | ICD-10-CM

## 2022-06-01 DIAGNOSIS — M6281 Muscle weakness (generalized): Secondary | ICD-10-CM

## 2022-06-01 DIAGNOSIS — M25641 Stiffness of right hand, not elsewhere classified: Secondary | ICD-10-CM

## 2022-06-01 DIAGNOSIS — R600 Localized edema: Secondary | ICD-10-CM | POA: Diagnosis not present

## 2022-06-04 NOTE — Therapy (Signed)
OUTPATIENT OCCUPATIONAL THERAPY TREATMENT NOTE  Patient Name: Ronald Castaneda MRN: [id number] DOB:11-29-1955, 67 y.o., male Today's Date: 06/08/2022  PCP: Hoyt Koch, MD  REFERRING PROVIDER: Hoyt Koch, MD   END OF SESSION:  OT End of Session - 06/08/22 1151     Visit Number 6    Number of Visits 12    Date for OT Re-Evaluation 06/25/22    Authorization Type [employer]    OT Start Time 1151    OT Stop Time 1233    OT Time Calculation (min) 42 min    Activity Tolerance Patient tolerated treatment well;No increased pain;Patient limited by fatigue;Patient limited by pain    Behavior During Therapy [facility] for tasks assessed/performed               Past Medical History:  Diagnosis Date   Depression    Diabetes mellitus without complication ([place])    HIV infection ([place])    Tobacco use    Past Surgical History:  Procedure Laterality Date   I & D EXTREMITY Right 03/09/2022   Procedure: IRRIGATION AND DEBRIDEMENT OF HAND;  Surgeon: Sherilyn Cooter, MD;  Location: [place];  Service: Orthopedics;  Laterality: Right;   None     Patient Active Problem List   Diagnosis Date Noted   Hypoglycemia due to insulin 04/02/2022   Infection of finger 03/11/2022   Right hand pain 03/09/2022   Puncture wound of right hand with infection 03/09/2022   DM (diabetes mellitus) ([place]) 03/05/2022   AKI (acute kidney injury) ([place]) 02/26/2022   Back pain 01/22/2022   Neck pain 01/22/2022   Chest wall pain 01/22/2022   Dyspnea on exertion    Atherosclerosis of aorta (HCC)    Hypercholesteremia    Benign hypertension    Smoking    COPD (chronic obstructive pulmonary disease) ([place]) 04/20/2021   Left hip pain 10/23/2020   Parotid cyst 07/20/2016   Cervical disc disorder with radiculopathy of cervical region 06/17/2016   Parotid nodule 06/17/2016   Erectile dysfunction 09/09/2015   Depression 04/01/2015   HIV disease ([place]) 02/27/2015   Routine general medical  examination at a health care facility 05/03/2014   Lipoma of back 05/03/2014   Former smoker 05/03/2014    ONSET DATE: DOS: 03/09/22  REFERRING DIAG: Z56.387 (ICD-10-CM) - Cellulitis of right hand   THERAPY DIAG:  Localized edema  Other lack of coordination  Pain in right hand  Stiffness of right hand, not elsewhere classified  Muscle weakness (generalized)  Rationale for Evaluation and Treatment: Rehabilitation  PERTINENT HISTORY: He underwent Rt middle finger dorsal abscess removal and A1 pulley release in October, and apparently did not f/u with hand surgeon or therapy and now has limited mobility with Rt hand, stiffness, etc.     Per order: "Needs hand PT had surgery did not do follow up. Right 3rd finger " "Per MD notes: "The cellulitis is resolved at this time. There is a lack of mobility and pain in the 3rd finger right hand. He did not follow up with surgeon. I am concerned he may have permanent loss of function if we do not start PT at this time. This is ordered and given contact information for surgeon's office I do feel he needs to see them back. He may need further procedure to help with mobilization if he does not progress. Rx tramadol 5 day supply to help with pain during PT. 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 He states being bitten by something in October, his hand swelled up and then he had to have his surgery.  He was unable to get any therapy after that.  He has been having difficulty using his right hand to do anything including hold objects or open jars or cut up food.  He states using his left hand for things and avoiding use of his right hand now.  He shakes my hand with his left hand."    PRECAUTIONS: None;  WEIGHT BEARING RESTRICTIONS: No  SUBJECTIVE:   SUBJECTIVE STATEMENT: He states doing very well and having a productive weekend.  He brings orthotics for adjustment in check today.   PAIN:  Are  you having pain?   Not now at rest  Rating: 0/10 at rest now  PATIENT GOALS: To get Rt hand moving better    OBJECTIVE: (All objective assessments below are from initial evaluation on: 05/11/22 unless otherwise specified.)    HAND DOMINANCE: Right   ADLs: Overall ADLs: States decreased ability to grab, hold household objects, pain and inability to open containers, perform FMS tasks (manipulate fasteners on clothing)   FUNCTIONAL OUTCOME MEASURES: Eval: Quck DASH 32% impairment today  (Higher % Score  =  More Impairment)     UPPER EXTREMITY ROM     Shoulder to Wrist AROM Right eval Rt 05/20/22 Rt 06/01/22  Wrist flexion 80  76  Wrist extension 40 45 58  (Blank rows = not tested)  Eval: He cannot close any of the fingers of his right hand to his distal palmar crease in a fist, but middle finger was the stiffest and measurements were taken there as a representation of the rest.  Thumb is moving well. Hand AROM Right eval Rt 05/20/22 Rt 05/25/22 Rt 05/27/22 Rt 06/01/22 Rt 06/08/22  Full Fist Ability (or Gap to Distal Palmar Crease) 6cm gap       Thumb Opposition Kapandji 10       Long MCP (0-90)  0 -61 0 - 65 0 - 61 (Post manual: 0- 69)  0 - 61 0 - 62 (76 post manual therapy) 0-69* (71* post therapy)  Long PIP (0-100)  (-27*) - 69 (-19) - 72 (-18) - 74 (Post manual: (-20)- 76)  (-18) - 74 (-20) - 73 (72post manual) (-17) - 69* (71* post therapy)  Long DIP (0-70) 0- 31 0 - 43 0- 43 (Post manual: 0- 40)  0 - 48 0 - 43 (47 post manual) 0- 43* (51* post therapy)  (Blank rows = not tested)   UPPER EXTREMITY MMT:     MMT Right 05/11/22  Wrist flexion 5/5  Wrist extension 5/5  MF flexion 5/5  MF Ext 5/5  (Blank rows = not tested)  HAND FUNCTION: 06/01/22:  12.8# Rt hand now   Eval: Observed weakness in affected hand.  Grip strength Right: 10 lbs, Left: 67 lbs   COORDINATION: Eval: Observed coordination impairments with affected hand. 9 Hole Peg Test Right: 24sec, Left:  21.7sec  EDEMA:   Eval:  Mildly swollen in MF of Rt hand   OBSERVATIONS:   06/08/22: During gripping and activities today, it is noticed that he has some crossing over of the fingers which could be in the long run holding back his flexion.  OT will try to address this with some kind of wedge between the fingers with active range of motion in upcoming sessions  Eval: He has positive as  a test of intrinsic tightness in the right hand.  He also shows some possible arthritic changes at the PIP joint.  Surgical areas are well-healed but somewhat adhered with scar tissue.  He is not painful to palpation.   TODAY'S TREATMENT:  06/08/22: He starts with new active range motion measurements which show only slight improvement today compared to the start of session last time.  OT then adjusts his dynamic finger flexion orthotic to apply even better stretch, he wears it and states feeling better stretch today.  OT also adjusted his night extension orthotic to remove index finger piece to only have the middle finger and now, because he states that feels more comfortable to him and gives him a better extension stretch.  Next he performs weightbearing and work simulations to increase the work of flexion in his hand.  He wears a medium weight work club with nonslip rubber glove over top and performs pushing and pulling and carrying to simulate lifting and pushing and pulling levers as below.  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  After this is over OT does another range of motion check which shows a few degrees better motion at each joint after this work is done.  This seems to show that pushing and working his hands with a forceful flexion may help free up scar adhesions and improved total grip.  We will continue to work on these efforts in therapy.  Biceps curls b/l 45# x10 Triceps b/l push 45# x10  Single hand row (lever simulation) 20# x 10  Farmer's Carry 15#  x120 ft   PATIENT EDUCATION: Education details: See tx section above for details  Person educated: Patient Education method: Veterinary surgeon, Teach back, Handouts  Education comprehension: States and demonstrates understanding, Additional Education required    HOME EXERCISE PROGRAM: Access Code: [email or web address] URL: [email or web address]    GOALS: Goals reviewed with patient? Yes   SHORT TERM GOALS: (STG required if POC>30 days) Target Date: 05/28/22  Pt will obtain protective, custom orthotic for mobilization. Goal status: 05/25/22: MET  2.  Pt will demo/state understanding of initial HEP to improve pain levels and prerequisite motion. Goal status: 05/27/22: MET   LONG TERM GOALS: Target Date: 06/25/22  Pt will improve functional ability by decreased impairment per Quick DASH  assessment from 32% to 10% or better, for better quality of life. Goal status: INITIAL  2.  Pt will improve grip strength in Rt dom hand from 10lbs to at least 50lbs for functional use at home and in IADLs. Goal status: INITIAL  3.  Pt will improve A/ROM in Rt MF TAM from 134* to at least 200*, to have functional motion for tasks like reach and grasp.  Goal status: INITIAL  4.  Pt will improve coordination skills in Rt hand, as seen by better score on 9HPT testing (to at least 21sec) to have increased functional ability to carry out fine motor tasks (fasteners, etc.) and more complex, coordinated IADLs (meal prep, sports, etc.).  Goal status: INITIAL    ASSESSMENT:  CLINICAL IMPRESSION: 06/08/22: Tried to address crossing over issue with wedge between fingers and active range of motion, stretching and squeezing hands tightly through strengthening is helping, but he is having a hard time maintaining these gains.  He was advised to please do these things throughout the day every day to try to help maintain the gains that he is receiving in therapy sessions.  He states understanding  OT  FREQUENCY: 2x/week  OT DURATION: 6 weeks (through 06/25/22 as needed)   PLANNED INTERVENTIONS: self care/ADL training, therapeutic exercise, therapeutic activity, manual therapy, scar mobilization, passive range of motion, electrical stimulation, moist heat, cryotherapy, patient/family education, and coping strategies training  CONSULTED AND AGREED WITH PLAN OF CARE: Patient  PLAN FOR NEXT SESSION:  Keep pushing and pulling working and stretching, try additional scar mobilization, continue work simulation and also try to prevent crossing over of digits with some kind of wedge system.   Benito Mccreedy, OTR/L, CHT 06/08/2022, 12:48 PM

## 2022-06-08 ENCOUNTER — Encounter: Payer: Self-pay | Admitting: Rehabilitative and Restorative Service Providers"

## 2022-06-08 ENCOUNTER — Ambulatory Visit: Payer: No Typology Code available for payment source | Admitting: Rehabilitative and Restorative Service Providers"

## 2022-06-08 DIAGNOSIS — R600 Localized edema: Secondary | ICD-10-CM | POA: Diagnosis not present

## 2022-06-08 DIAGNOSIS — M25641 Stiffness of right hand, not elsewhere classified: Secondary | ICD-10-CM

## 2022-06-08 DIAGNOSIS — R278 Other lack of coordination: Secondary | ICD-10-CM

## 2022-06-08 DIAGNOSIS — M79641 Pain in right hand: Secondary | ICD-10-CM

## 2022-06-08 DIAGNOSIS — M6281 Muscle weakness (generalized): Secondary | ICD-10-CM

## 2022-06-10 ENCOUNTER — Inpatient Hospital Stay: Admission: RE | Admit: 2022-06-10 | Payer: No Typology Code available for payment source | Source: Ambulatory Visit

## 2022-06-11 ENCOUNTER — Encounter: Payer: No Typology Code available for payment source | Admitting: Rehabilitative and Restorative Service Providers"

## 2022-06-11 ENCOUNTER — Encounter: Payer: Self-pay | Admitting: Internal Medicine

## 2022-06-11 ENCOUNTER — Other Ambulatory Visit: Payer: Self-pay | Admitting: Internal Medicine

## 2022-06-11 ENCOUNTER — Ambulatory Visit (INDEPENDENT_AMBULATORY_CARE_PROVIDER_SITE_OTHER): Payer: No Typology Code available for payment source | Admitting: Internal Medicine

## 2022-06-11 VITALS — BP 140/80 | HR 69 | Temp 98.2°F | Ht 69.0 in | Wt 171.0 lb

## 2022-06-11 DIAGNOSIS — M79641 Pain in right hand: Secondary | ICD-10-CM | POA: Diagnosis not present

## 2022-06-11 DIAGNOSIS — E119 Type 2 diabetes mellitus without complications: Secondary | ICD-10-CM | POA: Diagnosis not present

## 2022-06-11 DIAGNOSIS — E1369 Other specified diabetes mellitus with other specified complication: Secondary | ICD-10-CM | POA: Diagnosis not present

## 2022-06-11 DIAGNOSIS — Z794 Long term (current) use of insulin: Secondary | ICD-10-CM | POA: Diagnosis not present

## 2022-06-11 LAB — COMPREHENSIVE METABOLIC PANEL
ALT: 16 U/L (ref 0–53)
AST: 18 U/L (ref 0–37)
Albumin: 4 g/dL (ref 3.5–5.2)
Alkaline Phosphatase: 95 U/L (ref 39–117)
BUN: 16 mg/dL (ref 6–23)
CO2: 28 mEq/L (ref 19–32)
Calcium: 9.1 mg/dL (ref 8.4–10.5)
Chloride: 104 mEq/L (ref 96–112)
Creatinine, Ser: 1.13 mg/dL (ref 0.40–1.50)
GFR: 67.83 mL/min (ref >60.00)
Glucose, Bld: 100 mg/dL — ABNORMAL HIGH (ref 70–99)
Potassium: 4 mEq/L (ref 3.5–5.1)
Sodium: 142 mEq/L (ref 135–145)
Total Bilirubin: 0.2 mg/dL (ref 0.2–1.2)
Total Protein: 8.1 g/dL (ref 6.0–8.3)

## 2022-06-11 LAB — CBC
HCT: 40.8 % (ref 39.0–52.0)
Hemoglobin: 13.5 g/dL (ref 13.0–17.0)
MCHC: 33.2 g/dL (ref 30.0–36.0)
MCV: 78.5 fl (ref 78.0–100.0)
Platelets: 277 10*3/uL (ref 150.0–400.0)
RBC: 5.19 Mil/uL (ref 4.22–5.81)
RDW: 15.8 % — ABNORMAL HIGH (ref 11.5–15.5)
WBC: 8.1 10*3/uL (ref 4.0–10.5)

## 2022-06-11 LAB — MICROALBUMIN / CREATININE URINE RATIO
Creatinine,U: 51.8 mg/dL
Microalb Creat Ratio: 2.5 mg/g (ref 0.0–30.0)
Microalb, Ur: 1.3 mg/dL (ref 0.0–1.9)

## 2022-06-11 LAB — HEMOGLOBIN A1C: Hgb A1c MFr Bld: 7.2 % — ABNORMAL HIGH (ref 4.6–6.5)

## 2022-06-11 MED ORDER — FREESTYLE LIBRE 3 SENSOR MISC: 11 refills | Status: DC

## 2022-06-11 MED ORDER — INSULIN GLARGINE 100 UNIT/ML SOLOSTAR PEN: 18.0000 [IU] | PEN_INJECTOR | Freq: Every day | SUBCUTANEOUS | 11 refills | Status: DC

## 2022-06-11 NOTE — Assessment & Plan Note (Signed)
He does still have significant deformity and scar tissue right 3rd finger limiting ROM. Since he is under care of surgeon as far as work recommendations they should also fill out his disability forms and asked him to take those to them. They were given back to patient at visit.

## 2022-06-11 NOTE — Therapy (Incomplete)
OUTPATIENT OCCUPATIONAL THERAPY TREATMENT NOTE  Patient Name: Ronald Castaneda MRN: [id number] DOB:[birth year], 67 y.o., male Today's Date: 06/11/2022  PCP: Hoyt Koch, MD  REFERRING PROVIDER: Hoyt Koch, MD   END OF SESSION:      Past Medical History:  Diagnosis Date   Depression    Diabetes mellitus without complication ([place])    HIV infection ([place])    Tobacco use    Past Surgical History:  Procedure Laterality Date   I & D EXTREMITY Right 03/09/2022   Procedure: IRRIGATION AND DEBRIDEMENT OF HAND;  Surgeon: Sherilyn Cooter, MD;  Location: [place];  Service: Orthopedics;  Laterality: Right;   None     Patient Active Problem List   Diagnosis Date Noted   Hypoglycemia due to insulin 04/02/2022   Infection of finger 03/11/2022   Right hand pain 03/09/2022   Puncture wound of right hand with infection 03/09/2022   DM (diabetes mellitus) ([place]) 03/05/2022   AKI (acute kidney injury) ([place]) 02/26/2022   Back pain 01/22/2022   Neck pain 01/22/2022   Chest wall pain 01/22/2022   Dyspnea on exertion    Atherosclerosis of aorta (HCC)    Hypercholesteremia    Benign hypertension    Smoking    COPD (chronic obstructive pulmonary disease) ([place]) 04/20/2021   Left hip pain 10/23/2020   Parotid cyst 07/20/2016   Cervical disc disorder with radiculopathy of cervical region 06/17/2016   Parotid nodule 06/17/2016   Erectile dysfunction 09/09/2015   Depression 04/01/2015   HIV disease ([place]) 02/27/2015   Routine general medical examination at a health care facility 05/03/2014   Lipoma of back 05/03/2014   Former smoker 05/03/2014    ONSET DATE: DOS: 03/09/22  REFERRING DIAG: N82.956 (ICD-10-CM) - Cellulitis of right hand   THERAPY DIAG:  No diagnosis found.  Rationale for Evaluation and Treatment: Rehabilitation  PERTINENT HISTORY: He underwent Rt middle finger dorsal abscess removal and A1 pulley release in October, and apparently did not f/u with hand  surgeon or therapy and now has limited mobility with Rt hand, stiffness, etc.     Per order: "Needs hand PT had surgery did not do follow up. Right 3rd finger " "Per MD notes: "The cellulitis is resolved at this time. There is a lack of mobility and pain in the 3rd finger right hand. He did not follow up with surgeon. I am concerned he may have permanent loss of function if we do not start PT at this time. This is ordered and given contact information for surgeon's office I do feel he needs to see them back. He may need further procedure to help with mobilization if he does not progress. Rx tramadol 5 day supply to help with pain during PT. 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 He states being bitten by something in October, his hand swelled up and then he had to have his surgery.  He was unable to get any therapy after that.  He has been having difficulty using his right hand to do anything including hold objects or open jars or cut up food.  He states using his left hand for things and avoiding use of his right hand now.  He shakes my hand with his left hand."    PRECAUTIONS: None;  WEIGHT BEARING RESTRICTIONS: No  SUBJECTIVE:   SUBJECTIVE STATEMENT: He states ***   doing very well and having a productive weekend.  He  brings orthotics for adjustment in check today.   PAIN:  Are you having pain?  *** Not now at rest  Rating: 0/10 at rest now  PATIENT GOALS: To get Rt hand moving better    OBJECTIVE: (All objective assessments below are from initial evaluation on: 05/11/22 unless otherwise specified.)    HAND DOMINANCE: Right   ADLs: Overall ADLs: States decreased ability to grab, hold household objects, pain and inability to open containers, perform FMS tasks (manipulate fasteners on clothing)   FUNCTIONAL OUTCOME MEASURES: Eval: Quck DASH 32% impairment today  (Higher % Score  =  More Impairment)     UPPER EXTREMITY ROM      Shoulder to Wrist AROM Right eval Rt 05/20/22 Rt 06/01/22  Wrist flexion 80  76  Wrist extension 40 45 58  (Blank rows = not tested)  Eval: He cannot close any of the fingers of his right hand to his distal palmar crease in a fist, but middle finger was the stiffest and measurements were taken there as a representation of the rest.  Thumb is moving well. Hand AROM Right eval Rt 05/20/22 Rt 06/01/22 Rt 06/08/22 Rt 06/15/22  Full Fist Ability (or Gap to Distal Palmar Crease) 6cm gap      Thumb Opposition Kapandji 10      Long MCP (0-90)  0 -61 0 - 65 0 - 62 (76 post manual therapy) 0-69* (71* post therapy) 0 - ***  Long PIP (0-100)  (-27*) - 69 (-19) - 72 (-20) - 73 (72post manual) (-17) - 69* (71* post therapy) 0 - ***  Long DIP (0-70) 0- 31 0 - 43 0 - 43 (47 post manual) 0- 43* (51* post therapy) 0 - ***  (Blank rows = not tested)   UPPER EXTREMITY MMT:     MMT Right 05/11/22  Wrist flexion 5/5  Wrist extension 5/5  MF flexion 5/5  MF Ext 5/5  (Blank rows = not tested)  HAND FUNCTION: 06/15/22: Rt grip strength ***  06/01/22:  12.8# Rt hand now   Eval: Observed weakness in affected hand.  Grip strength Right: 10 lbs, Left: 67 lbs   COORDINATION: Eval: Observed coordination impairments with affected hand. 9 Hole Peg Test Right: 24sec, Left: 21.7sec  EDEMA:   Eval:  Mildly swollen in MF of Rt hand   OBSERVATIONS:   06/08/22: During gripping and activities today, it is noticed that he has some crossing over of the fingers which could be in the long run holding back his flexion.  OT will try to address this with some kind of wedge between the fingers with active range of motion in upcoming sessions  Eval: He has positive as a test of intrinsic tightness in the right hand.  He also shows some possible arthritic changes at the PIP joint.  Surgical areas are well-healed but somewhat adhered with scar tissue.  He is not painful to palpation.   TODAY'S TREATMENT:  06/15/22:  ***Keep pushing and pulling working and stretching, try additional scar mobilization, continue work simulation and also try to prevent crossing over of digits with some kind of wedge system.  06/08/22: He starts with new active range motion measurements which show only slight improvement today compared to the start of session last time.  OT then adjusts his dynamic finger flexion orthotic to apply even better stretch, he wears it and states feeling better stretch today.  OT also adjusted his night extension orthotic to remove index finger piece to only  have the middle finger and now, because he states that feels more comfortable to him and gives him a better extension stretch.  Next he performs weightbearing and work simulations to increase the work of flexion in his hand.  He wears a medium weight work club with nonslip rubber glove over top and performs pushing and pulling and carrying to simulate lifting and pushing and pulling levers as below.  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  After this is over OT does another range of motion check which shows a few degrees better motion at each joint after this work is done.  This seems to show that pushing and working his hands with a forceful flexion may help free up scar adhesions and improved total grip.  We will continue to work on these efforts in therapy.  Biceps curls b/l 45# x10 Triceps b/l push 45# x10  Single hand row (lever simulation) 20# x 10  Farmer's Carry 15# x120 ft   PATIENT EDUCATION: Education details: See tx section above for details  Person educated: Patient Education method: Veterinary surgeon, Teach back, Handouts  Education comprehension: States and demonstrates understanding, Additional Education required    HOME EXERCISE PROGRAM: Access Code: [email or web address] URL: [email or web address]    GOALS: Goals reviewed with patient? Yes   SHORT TERM GOALS: (STG required  if POC>30 days) Target Date: 05/28/22  Pt will obtain protective, custom orthotic for mobilization. Goal status: 05/25/22: MET  2.  Pt will demo/state understanding of initial HEP to improve pain levels and prerequisite motion. Goal status: 05/27/22: MET   LONG TERM GOALS: Target Date: 06/25/22  Pt will improve functional ability by decreased impairment per Quick DASH  assessment from 32% to 10% or better, for better quality of life. Goal status: INITIAL  2.  Pt will improve grip strength in Rt dom hand from 10lbs to at least 50lbs for functional use at home and in IADLs. Goal status: INITIAL  3.  Pt will improve A/ROM in Rt MF TAM from 134* to at least 200*, to have functional motion for tasks like reach and grasp.  Goal status: INITIAL  4.  Pt will improve coordination skills in Rt hand, as seen by better score on 9HPT testing (to at least 21sec) to have increased functional ability to carry out fine motor tasks (fasteners, etc.) and more complex, coordinated IADLs (meal prep, sports, etc.).  Goal status: INITIAL    ASSESSMENT:  CLINICAL IMPRESSION: 06/15/22: ***  06/08/22: Tried to address crossing over issue with wedge between fingers and active range of motion, stretching and squeezing hands tightly through strengthening is helping, but he is having a hard time maintaining these gains.  He was advised to please do these things throughout the day every day to try to help maintain the gains that he is receiving in therapy sessions.  He states understanding    OT FREQUENCY: 2x/week  OT DURATION: 6 weeks (through 06/25/22 as needed)   PLANNED INTERVENTIONS: self care/ADL training, therapeutic exercise, therapeutic activity, manual therapy, scar mobilization, passive range of motion, electrical stimulation, moist heat, cryotherapy, patient/family education, and coping strategies training  CONSULTED AND AGREED WITH PLAN OF CARE: Patient  PLAN FOR NEXT SESSION:  ***   Benito Mccreedy, OTR/L, CHT 06/11/2022, 8:50 AM

## 2022-06-11 NOTE — Assessment & Plan Note (Signed)
Checking HgA1c and microalbumin to creatinine ratio and CBC and CMP to assess control. He is using insulin regimen with lantus 18 units daily and humalog up to 10 units with meals. Using continuous glucose monitor to help avoid hypoglycemia which he has had several episodes since diagnosis.

## 2022-06-11 NOTE — Patient Instructions (Addendum)
Please call to reschedule the CT scan of the stomach since you did not go to that yesterday.

## 2022-06-11 NOTE — Progress Notes (Signed)
   Subjective:   Patient ID: Ronald Castaneda, male    DOB: 1955/11/04, 67 y.o.   MRN: [id number]  HPI The patient is a 67 YO man coming in for concerns about hand and insulins. Did not get CT scan yesterday on abdomen. Has seen hand surgeon since our last visit and is still out of work and still working with ot. Still limited ROM in the right hand.  Review of Systems  Constitutional: Negative.   HENT: Negative.    Eyes: Negative.   Respiratory:  Negative for cough, chest tightness and shortness of breath.   Cardiovascular:  Negative for chest pain, palpitations and leg swelling.  Gastrointestinal:  Negative for abdominal distention, abdominal pain, constipation, diarrhea, nausea and vomiting.  Musculoskeletal:  Positive for arthralgias and myalgias.  Skin: Negative.   Neurological: Negative.   Psychiatric/Behavioral: Negative.      Objective:  Physical Exam Constitutional:      Appearance: He is well-developed.  HENT:     Head: Normocephalic and atraumatic.  Cardiovascular:     Rate and Rhythm: Normal rate and regular rhythm.  Pulmonary:     Effort: Pulmonary effort is normal. No respiratory distress.     Breath sounds: Normal breath sounds. No wheezing or rales.  Abdominal:     General: Bowel sounds are normal. There is no distension.     Palpations: Abdomen is soft.     Tenderness: There is no abdominal tenderness. There is no rebound.  Musculoskeletal:        General: Deformity present.     Cervical back: Normal range of motion.  Skin:    General: Skin is warm and dry.  Neurological:     Mental Status: He is alert and oriented to person, place, and time.     Coordination: Coordination normal.     Vitals:   06/11/22 1022  BP: (!) 140/80  Pulse: 69  Temp: 98.2 F (36.8 C)  TempSrc: Oral  SpO2: 98%  Weight: 171 lb (77.6 kg)  Height: '5\' 9"'$  (1.753 m)    Assessment & Plan:

## 2022-06-14 ENCOUNTER — Other Ambulatory Visit: Payer: Self-pay

## 2022-06-14 MED ORDER — INSULIN PEN NEEDLE 32G X 4 MM MISC
11 refills | Status: DC
  Filled 2022-06-14: qty 100, 25d supply, fill #0

## 2022-06-15 ENCOUNTER — Encounter: Payer: No Typology Code available for payment source | Admitting: Rehabilitative and Restorative Service Providers"

## 2022-06-15 NOTE — Therapy (Incomplete)
OUTPATIENT OCCUPATIONAL THERAPY TREATMENT NOTE  Patient Name: Ronald Castaneda MRN: [id number] DOB:1955-09-22, 67 y.o., male Today's Date: 06/15/2022  PCP: Hoyt Koch, MD  REFERRING PROVIDER: Hoyt Koch, MD   END OF SESSION:      Past Medical History:  Diagnosis Date   Depression    Diabetes mellitus without complication ([place])    HIV infection ([place])    Tobacco use    Past Surgical History:  Procedure Laterality Date   I & D EXTREMITY Right 03/09/2022   Procedure: IRRIGATION AND DEBRIDEMENT OF HAND;  Surgeon: Sherilyn Cooter, MD;  Location: [facility];  Service: Orthopedics;  Laterality: Right;   None     Patient Active Problem List   Diagnosis Date Noted   Hypoglycemia due to insulin 04/02/2022   Infection of finger 03/11/2022   Right hand pain 03/09/2022   DM (diabetes mellitus) ([place]) 03/05/2022   AKI (acute kidney injury) ([place]) 02/26/2022   Back pain 01/22/2022   Neck pain 01/22/2022   Chest wall pain 01/22/2022   Dyspnea on exertion    Atherosclerosis of aorta (H[facility])    Hypercholesteremia    Benign hypertension    Smoking    COPD (chronic obstructive pulmonary disease) ([place]) 04/20/2021   Left hip pain 10/23/2020   Parotid cyst 07/20/2016   Cervical disc disorder with radiculopathy of cervical region 06/17/2016   Parotid nodule 06/17/2016   Erectile dysfunction 09/09/2015   Depression 04/01/2015   HIV disease ([place]) 02/27/2015   Routine general medical examination at a health care facility 05/03/2014   Lipoma of back 05/03/2014   Former smoker 05/03/2014    ONSET DATE: DOS: 03/09/22  REFERRING DIAG: K74.259 (ICD-10-CM) - Cellulitis of right hand   THERAPY DIAG:  No diagnosis found.  Rationale for Evaluation and Treatment: Rehabilitation  PERTINENT HISTORY: He underwent Rt middle finger dorsal abscess removal and A1 pulley release in October, and apparently did not f/u with hand surgeon or therapy and now has limited mobility with Rt  hand, stiffness, etc.     Per order: "Needs hand PT had surgery did not do follow up. Right 3rd finger " "Per MD notes: "The cellulitis is resolved at this time. There is a lack of mobility and pain in the 3rd finger right hand. He did not follow up with surgeon. I am concerned he may have permanent loss of function if we do not start PT at this time. This is ordered and given contact information for surgeon's office I do feel he needs to see them back. He may need further procedure to help with mobilization if he does not progress. Rx tramadol 5 day supply to help with pain during PT. 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 He states being bitten by something in October, his hand swelled up and then he had to have his surgery.  He was unable to get any therapy after that.  He has been having difficulty using his right hand to do anything including hold objects or open jars or cut up food.  He states using his left hand for things and avoiding use of his right hand now.  He shakes my hand with his left hand."    PRECAUTIONS: None;  WEIGHT BEARING RESTRICTIONS: No  SUBJECTIVE:   SUBJECTIVE STATEMENT: He states ***   doing very well and having a productive weekend.  He brings orthotics for adjustment in check today.   PAIN:  Are you having pain?  *** Not now at rest  Rating: 0/10 at rest now  PATIENT GOALS: To get Rt hand moving better    OBJECTIVE: (All objective assessments below are from initial evaluation on: 05/11/22 unless otherwise specified.)    HAND DOMINANCE: Right   ADLs: Overall ADLs: States decreased ability to grab, hold household objects, pain and inability to open containers, perform FMS tasks (manipulate fasteners on clothing)   FUNCTIONAL OUTCOME MEASURES: Eval: Quck DASH 32% impairment today  (Higher % Score  =  More Impairment)     UPPER EXTREMITY ROM     Shoulder to Wrist AROM Right eval Rt 05/20/22  Rt 06/01/22  Wrist flexion 80  76  Wrist extension 40 45 58  (Blank rows = not tested)  Eval: He cannot close any of the fingers of his right hand to his distal palmar crease in a fist, but middle finger was the stiffest and measurements were taken there as a representation of the rest.  Thumb is moving well. Hand AROM Right eval Rt 06/08/22 Rt 06/17/22  Full Fist Ability (or Gap to Distal Palmar Crease) 6cm gap    Thumb Opposition [place] 10    Long MCP (0-90)  0 -61 0-69* (71* post therapy) 0 - ***  Long PIP (0-100)  (-27*) - 69 (-17) - 69* (71* post therapy) 0 - ***  Long DIP (0-70) 0- 31 0- 43* (51* post therapy) 0 - ***  (Blank rows = not tested)   UPPER EXTREMITY MMT:     MMT Right 05/11/22  Wrist flexion 5/5  Wrist extension 5/5  MF flexion 5/5  MF Ext 5/5  (Blank rows = not tested)  HAND FUNCTION: 06/17/22: Rt grip strength ***  06/01/22:  12.8# Rt hand now   Eval: Observed weakness in affected hand.  Grip strength Right: 10 lbs, Left: 67 lbs   COORDINATION: Eval: Observed coordination impairments with affected hand. 9 Hole Peg Test Right: 24sec, Left: 21.7sec  EDEMA:   Eval:  Mildly swollen in MF of Rt hand   OBSERVATIONS:   06/08/22: During gripping and activities today, it is noticed that he has some crossing over of the fingers which could be in the long run holding back his flexion.  OT will try to address this with some kind of wedge between the fingers with active range of motion in upcoming sessions  Eval: He has positive as a test of intrinsic tightness in the right hand.  He also shows some possible arthritic changes at the PIP joint.  Surgical areas are well-healed but somewhat adhered with scar tissue.  He is not painful to palpation.   TODAY'S TREATMENT:  06/17/22: ***Keep pushing and pulling working and stretching, try additional scar mobilization, continue work simulation and also try to prevent crossing over of digits with some kind of wedge  system.  06/08/22: He starts with new active range motion measurements which show only slight improvement today compared to the start of session last time.  OT then adjusts his dynamic finger flexion orthotic to apply even better stretch, he wears it and states feeling better stretch today.  OT also adjusted his night extension orthotic to remove index finger piece to only have the middle finger and now, because he states that feels more comfortable to him and gives him a better extension stretch.  Next he performs weightbearing and work simulations to increase the work of flexion in his hand.  He wears a medium weight work  club with nonslip rubber glove over top and performs pushing and pulling and carrying to simulate lifting and pushing and pulling levers as below.  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  After this is over OT does another range of motion check which shows a few degrees better motion at each joint after this work is done.  This seems to show that pushing and working his hands with a forceful flexion may help free up scar adhesions and improved total grip.  We will continue to work on these efforts in therapy.  Biceps curls b/l 45# x10 Triceps b/l push 45# x10  Single hand row (lever simulation) 20# x 10  Farmer's Carry 15# x120 ft   PATIENT EDUCATION: Education details: See tx section above for details  Person educated: Patient Education method: Veterinary surgeon, Teach back, Handouts  Education comprehension: States and demonstrates understanding, Additional Education required    HOME EXERCISE PROGRAM: Access Code: [email or web address] URL: [email or web address]    GOALS: Goals reviewed with patient? Yes   SHORT TERM GOALS: (STG required if POC>30 days) Target Date: 05/28/22  Pt will obtain protective, custom orthotic for mobilization. Goal status: 05/25/22: MET  2.  Pt will demo/state understanding of initial HEP to  improve pain levels and prerequisite motion. Goal status: 05/27/22: MET   LONG TERM GOALS: Target Date: 06/25/22  Pt will improve functional ability by decreased impairment per Quick DASH  assessment from 32% to 10% or better, for better quality of life. Goal status: INITIAL  2.  Pt will improve grip strength in Rt dom hand from 10lbs to at least 50lbs for functional use at home and in IADLs. Goal status: INITIAL  3.  Pt will improve A/ROM in Rt MF TAM from 134* to at least 200*, to have functional motion for tasks like reach and grasp.  Goal status: INITIAL  4.  Pt will improve coordination skills in Rt hand, as seen by better score on 9HPT testing (to at least 21sec) to have increased functional ability to carry out fine motor tasks (fasteners, etc.) and more complex, coordinated IADLs (meal prep, sports, etc.).  Goal status: INITIAL    ASSESSMENT:  CLINICAL IMPRESSION: 06/17/22: ***  06/08/22: Tried to address crossing over issue with wedge between fingers and active range of motion, stretching and squeezing hands tightly through strengthening is helping, but he is having a hard time maintaining these gains.  He was advised to please do these things throughout the day every day to try to help maintain the gains that he is receiving in therapy sessions.  He states understanding    OT FREQUENCY: 2x/week  OT DURATION: 6 weeks (through 06/25/22 as needed)   PLANNED INTERVENTIONS: self care/ADL training, therapeutic exercise, therapeutic activity, manual therapy, scar mobilization, passive range of motion, electrical stimulation, moist heat, cryotherapy, patient/family education, and coping strategies training  CONSULTED AND AGREED WITH PLAN OF CARE: Patient  PLAN FOR NEXT SESSION:  ***   Benito Mccreedy, OTR/L, CHT 06/15/2022, 9:06 AM

## 2022-06-16 ENCOUNTER — Other Ambulatory Visit: Payer: Self-pay

## 2022-06-17 ENCOUNTER — Encounter: Payer: No Typology Code available for payment source | Admitting: Rehabilitative and Restorative Service Providers"

## 2022-06-17 NOTE — Therapy (Signed)
OUTPATIENT OCCUPATIONAL THERAPY TREATMENT & DISCHARGE NOTE  Patient Name: Ronald Castaneda MRN: [id number] DOB:11/13/1955, 68 y.o., male Today's Date: 06/22/2022  PCP: Hoyt Koch, MD  REFERRING PROVIDER: Hoyt Koch, MD   END OF SESSION:  OT End of Session - 06/22/22 1055     Visit Number 7    Number of Visits 12    Date for OT Re-Evaluation 06/25/22    Authorization Type [employer]    OT Start Time 1059    OT Stop Time 4098    OT Time Calculation (min) 33 min    Activity Tolerance Patient tolerated treatment well;No increased pain    Behavior During Therapy WFL for tasks assessed/performed                Past Medical History:  Diagnosis Date   Depression    Diabetes mellitus without complication ([place])    HIV infection ([place])    Tobacco use    Past Surgical History:  Procedure Laterality Date   I & D EXTREMITY Right 03/09/2022   Procedure: IRRIGATION AND DEBRIDEMENT OF HAND;  Surgeon: Sherilyn Cooter, MD;  Location: [facility];  Service: Orthopedics;  Laterality: Right;   None     Patient Active Problem List   Diagnosis Date Noted   Hypoglycemia due to insulin 04/02/2022   Infection of finger 03/11/2022   Right hand pain 03/09/2022   DM (diabetes mellitus) ([place]) 03/05/2022   AKI (acute kidney injury) ([place]) 02/26/2022   Back pain 01/22/2022   Neck pain 01/22/2022   Chest wall pain 01/22/2022   Dyspnea on exertion    Atherosclerosis of aorta ([facility])    Hypercholesteremia    Benign hypertension    Smoking    COPD (chronic obstructive pulmonary disease) ([place]) 04/20/2021   Left hip pain 10/23/2020   Parotid cyst 07/20/2016   Cervical disc disorder with radiculopathy of cervical region 06/17/2016   Parotid nodule 06/17/2016   Erectile dysfunction 09/09/2015   Depression 04/01/2015   HIV disease ([place]) 02/27/2015   Routine general medical examination at a health care facility 05/03/2014   Lipoma of back 05/03/2014   Former smoker  05/03/2014    ONSET DATE: DOS: 03/09/22  REFERRING DIAG: J19.147 (ICD-10-CM) - Cellulitis of right hand   THERAPY DIAG:  Localized edema  Muscle weakness (generalized)  Pain in right hand  Other lack of coordination  Stiffness of right hand, not elsewhere classified  Rationale for Evaluation and Treatment: Rehabilitation  PERTINENT HISTORY: He underwent Rt middle finger dorsal abscess removal and A1 pulley release in October, and apparently did not f/u with hand surgeon or therapy and now has limited mobility with Rt hand, stiffness, etc.     Per order: "Needs hand PT had surgery did not do follow up. Right 3rd finger " "Per MD notes: "The cellulitis is resolved at this time. There is a lack of mobility and pain in the 3rd finger right hand. He did not follow up with surgeon. I am concerned he may have permanent loss of function if we do not start PT at this time. This is ordered and given contact information for surgeon's office I do feel he needs to see them back. He may need further procedure to help with mobilization if he does not progress. Rx tramadol 5 day supply to help with pain during PT. 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 He states being bitten by  something in October, his hand swelled up and then he had to have his surgery.  He was unable to get any therapy after that.  He has been having difficulty using his right hand to do anything including hold objects or open jars or cut up food.  He states using his left hand for things and avoiding use of his right hand now.  He shakes my hand with his left hand."    PRECAUTIONS: None;  WEIGHT BEARING RESTRICTIONS: No  SUBJECTIVE:   SUBJECTIVE STATEMENT: He states having a new infection in his leg and cx past appointments bc he could barely walk, etc. He is on antibiotics now and feeling better, but still recovering.    PAIN:  Are you having pain?  Not now at rest or  in past week in Rt hand  Rating: 0/10 at rest now  PATIENT GOALS: To get Rt hand moving better    OBJECTIVE: (All objective assessments below are from initial evaluation on: 05/11/22 unless otherwise specified.)   HAND DOMINANCE: Right   ADLs: Overall ADLs: States decreased ability to grab, hold household objects, pain and inability to open containers, perform FMS tasks (manipulate fasteners on clothing)   FUNCTIONAL OUTCOME MEASURES: 06/22/22: Quick DASH: 29% today   Eval: Quck DASH 32% impairment today  (Higher % Score  =  More Impairment)     UPPER EXTREMITY ROM     Shoulder to Wrist AROM Right eval Rt 05/20/22 Rt 06/01/22 Rt 06/22/22  Wrist flexion 80  76 74  Wrist extension 40 45 58 64  (Blank rows = not tested)   Hand AROM Right eval Rt 06/08/22 Rt 06/22/22  Full Fist Ability (or Gap to Distal Palmar Crease) 6cm gap  5.5cm  Thumb Opposition [place] 10    Long MCP (0-90)  0 -61 0-69* (71* post therapy) 0 - 75  Long PIP (0-100)  (-27*) - 69 (-17) - 69* (71* post therapy) (-15) - 71  Long DIP (0-70) 0- 31 0- 43* (51* post therapy) 0 - 43  (Blank rows = not tested)   UPPER EXTREMITY MMT:     MMT Right 05/11/22  Wrist flexion 5/5  Wrist extension 5/5  MF flexion 5/5  MF Ext 5/5  (Blank rows = not tested)  HAND FUNCTION: 06/22/22: Rt grip strength 30# now and non-painful  06/01/22:  12.8# Rt hand now   Eval: Observed weakness in affected hand.  Grip strength Right: 10 lbs, Left: 67 lbs   COORDINATION: 06/22/22: 9HPT: 22 sec today   Eval: Observed coordination impairments with affected hand. 9 Hole Peg Test Right: 24sec, Left: 21.7sec  EDEMA:   06/22/22: none significant now   OBSERVATIONS:   06/22/22: scars are smoother and he has better overall use of right hand now  TODAY'S TREATMENT:  06/22/22: The end of his current plan of care, he performs new range of motion gripping activities and review of functional abilities for progress note today.  He also  discusses home and work tasks and talks through problem-solving scenarios for ways to compensate if needed.  He states that is much better and he is confident to return to work now.  He is still advised to use work gloves and modalities and stretches intermittently to continue his progress which will likely still occur.  He briefly reviews his home exercises as well and is comfortable with discharge today.  PATIENT EDUCATION: Education details: See tx section above for details  Person educated: Patient Education method: Verbal  Instruction, Teach back, Handouts  Education comprehension: States and demonstrates understanding, Additional Education required    HOME EXERCISE PROGRAM: Access Code: [email or web address] URL: [email or web address]    GOALS: Goals reviewed with patient? Yes   SHORT TERM GOALS: (STG required if POC>30 days) Target Date: 05/28/22  Pt will obtain protective, custom orthotic for mobilization. Goal status: 05/25/22: MET  2.  Pt will demo/state understanding of initial HEP to improve pain levels and prerequisite motion. Goal status: 05/27/22: MET   LONG TERM GOALS: Target Date: 06/25/22  Pt will improve functional ability by decreased impairment per Quick DASH  assessment from 32% to 10% or better, for better quality of life. Goal status: 06/22/22: Not Met, but now 29% improved   2.  Pt will improve grip strength in Rt dom hand from 10lbs to at least 50lbs for functional use at home and in IADLs. Goal status: 06/22/22: not met, but greatly improved to non-painful 30# now   3.  Pt will improve A/ROM in Rt MF TAM from 134* to at least 200*, to have functional motion for tasks like reach and grasp.  Goal status: 06/22/22: now 174* much improved   4.  Pt will improve coordination skills in Rt hand, as seen by better score on 9HPT testing (to at least 21sec) to have increased functional ability to carry out fine motor tasks (fasteners, etc.) and more complex, coordinated  IADLs (meal prep, sports, etc.).  Goal status: 06/22/22: not met, but improved to 22sec now    ASSESSMENT:  CLINICAL IMPRESSION: 06/22/22: He has made some excellent gains despite a rather chronic issue.  He gained 40 total degrees motion in his right middle finger and states that he can now pick up coins and uses them functionally.  He gained 20 pounds of grip strength and he now feels comfortable trying to return to work.  Even his coordination improved somewhat.  He has orthotics and full home exercise program and states knowing how to perform these things so he will discharge today to self-management but could always return in the future if he chooses.   OT FREQUENCY: Discharge today  OT DURATION: Discharge today  PLANNED INTERVENTIONS: self care/ADL training, therapeutic exercise, therapeutic activity, manual therapy, scar mobilization, passive range of motion, electrical stimulation, moist heat, cryotherapy, patient/family education, and coping strategies training  CONSULTED AND AGREED WITH PLAN OF CARE: Patient  PLAN FOR NEXT SESSION:  Discharge today   Benito Mccreedy, OTR/L, CHT 06/22/2022, 11:52 AM     OCCUPATIONAL THERAPY DISCHARGE SUMMARY  Visits from Start of Care: 7  Current functional level related to goals / functional outcomes: Pt has did not meet all goals, but made significant progress and is pleased with outcomes.   Remaining deficits: Pt has only mild functional deficits and no pain.   Education / Equipment: Pt has all needed materials and education. Pt understands how to continue on with self-management. See tx notes for more details.   Patient agrees to discharge due to max benefits received from outpatient occupational therapy / hand therapy at this time.   Benito Mccreedy, OTR/L, CHT 06/22/22

## 2022-06-18 ENCOUNTER — Other Ambulatory Visit: Payer: Self-pay

## 2022-06-18 ENCOUNTER — Emergency Department (HOSPITAL_COMMUNITY)
Admission: EM | Admit: 2022-06-18 | Discharge: 2022-06-18 | Disposition: A | Discharge status: Home / Self Care | Payer: No Typology Code available for payment source | Attending: Emergency Medicine | Admitting: Emergency Medicine

## 2022-06-18 DIAGNOSIS — Z7982 Long term (current) use of aspirin: Secondary | ICD-10-CM | POA: Diagnosis not present

## 2022-06-18 DIAGNOSIS — Z794 Long term (current) use of insulin: Secondary | ICD-10-CM | POA: Diagnosis not present

## 2022-06-18 DIAGNOSIS — E119 Type 2 diabetes mellitus without complications: Secondary | ICD-10-CM | POA: Insufficient documentation

## 2022-06-18 DIAGNOSIS — N390 Urinary tract infection, site not specified: Secondary | ICD-10-CM | POA: Insufficient documentation

## 2022-06-18 DIAGNOSIS — Z21 Asymptomatic human immunodeficiency virus [HIV] infection status: Secondary | ICD-10-CM | POA: Diagnosis not present

## 2022-06-18 DIAGNOSIS — N50812 Left testicular pain: Secondary | ICD-10-CM | POA: Insufficient documentation

## 2022-06-18 DIAGNOSIS — N50811 Right testicular pain: Secondary | ICD-10-CM | POA: Insufficient documentation

## 2022-06-18 DIAGNOSIS — Z7984 Long term (current) use of oral hypoglycemic drugs: Secondary | ICD-10-CM | POA: Insufficient documentation

## 2022-06-18 DIAGNOSIS — R300 Dysuria: Secondary | ICD-10-CM | POA: Diagnosis present

## 2022-06-18 DIAGNOSIS — Z87891 Personal history of nicotine dependence: Secondary | ICD-10-CM | POA: Insufficient documentation

## 2022-06-18 LAB — URINALYSIS, ROUTINE W REFLEX MICROSCOPIC
Bilirubin Urine: NEGATIVE
Glucose, UA: NEGATIVE mg/dL
Hgb urine dipstick: NEGATIVE
Ketones, ur: 5 mg/dL — AB
Nitrite: NEGATIVE
Protein, ur: 30 mg/dL — AB
Specific Gravity, Urine: 1.025 (ref 1.005–1.030)
WBC, UA: >50 WBC/hpf (ref 0–5)
pH: 5 (ref 5.0–8.0)

## 2022-06-18 MED ORDER — CEPHALEXIN 500 MG PO CAPS
500.0000 mg | ORAL_CAPSULE | Freq: Four times a day (QID) | ORAL | 0 refills | Status: DC
  Filled 2022-06-18: qty 40, 10d supply, fill #0

## 2022-06-18 MED ORDER — CEPHALEXIN 500 MG PO CAPS: 500.0000 mg | ORAL_CAPSULE | Freq: Four times a day (QID) | ORAL | 0 refills | Status: AC

## 2022-06-18 MED ORDER — CEPHALEXIN 500 MG PO CAPS
1000.0000 mg | ORAL_CAPSULE | Freq: Once | ORAL | Status: AC
  Administered 2022-06-18: 1000 mg via ORAL
  Filled 2022-06-18: qty 2

## 2022-06-18 MED ORDER — HYDROCODONE-ACETAMINOPHEN 5-325 MG PO TABS
1.0000 | ORAL_TABLET | Freq: Once | ORAL | Status: AC
  Administered 2022-06-18: 1 via ORAL
  Filled 2022-06-18: qty 1

## 2022-06-18 NOTE — ED Triage Notes (Signed)
Reports pain 'down below' that started yesterday in his testicles, got worse today, pain w/ urination. Denies urinary changes. Pain w/ ambulation and genital manipulation.

## 2022-06-18 NOTE — Discharge Instructions (Signed)
Thank you for allowing me to be part of your care today.  You received your first dose of antibiotic while in the ED to treat your suspected UTI.  Please pick up your antibiotic prescription tomorrow and take for the next 10 days.  I recommend following up with your primary care provider on Monday should your symptoms be persisting over the weekend without improvement.  Return to the ED if you experience worsening of your symptoms or have any new concerns.  I recommend taking Tylenol or ibuprofen as needed for swelling and pain.

## 2022-06-18 NOTE — ED Provider Notes (Signed)
[place] Provider Note   CSN: [id number] Arrival date & time: 06/18/22  1502     History  Chief Complaint  Patient presents with   Testicle Pain   Dysuria    Ronald Castaneda is a 67 y.o. male presents to the ED complaining of bilateral testicular pain, worse on the left, and dysuria.  Patient states his symptoms began yesterday and been worsening over the last 24 hours.  He states it is painful to walk as well as touch.  Denies fever, flank pain, penile discharge, difficulty urinating, decreased urination, urinary frequency.  Patient does have history significant for HIV, diabetes, and tobacco use.       Home Medications Prior to Admission medications   Medication Sig Start Date End Date Taking? Authorizing Provider  cephALEXin (KEFLEX) 500 MG capsule Take 1 capsule (500 mg total) by mouth 4 (four) times daily for 10 days. 06/18/22 06/28/22 Yes Terrance Lanahan R, PA  albuterol (VENTOLIN HFA) 108 (90 Base) MCG/ACT inhaler TAKE 2 PUFFS BY MOUTH EVERY 6 HOURS AS NEEDED FOR WHEEZE OR SHORTNESS OF BREATH 05/12/22   Hoyt Koch, MD  aspirin 81 MG chewable tablet Chew 1 tablet (81 mg total) by mouth daily. 04/22/21   Thurnell Lose, MD  blood glucose meter kit and supplies KIT Dispense based on patient and insurance preference. Use up to four times daily as directed. 02/28/22   Elodia Florence., MD  blood glucose meter kit and supplies KIT Use up to four times daily as directed. 03/01/22   Elodia Florence., MD  chlorhexidine (HIBICLENS) 4 % external liquid Daily soaks of right hand in warm, diluted Hibiclens solution. 03/15/22   Geradine Girt, DO  Continuous Blood Gluc Sensor (FREESTYLE LIBRE 3 SENSOR) MISC Place 1 sensor on the skin every 14 days. Use to check glucose continuously 06/11/22   Hoyt Koch, MD  Darunavir-Cobicistat-Emtricitabine-Tenofovir Alafenamide [facility]) 800-150-200-10 MG TABS Take 1 tablet by  mouth daily with breakfast. PLEASE DELIVERY MEDICATIONS 04/19/22 04/19/23  Michel Bickers, MD  dolutegravir (TIVICAY) 50 MG tablet Take 1 tablet (50 mg total) by mouth daily. 04/19/22 11/15/22  Michel Bickers, MD  Fingerstix Lancets MISC Use as needed to check blood sugars up to 4 times daily 03/01/22   Elodia Florence., MD  glucose blood test strip use as needed to check blood sugars up to 4 times daily 03/01/22   Elodia Florence., MD  ibuprofen (ADVIL) 400 MG tablet Take 400 mg by mouth every 6 (six) hours as needed for mild pain or moderate pain.    [provider]  insulin glargine (LANTUS) 100 UNIT/ML Solostar Pen Inject 18 Units into the skin daily. 06/11/22   Hoyt Koch, MD  insulin lispro (HUMALOG) 100 UNIT/ML KwikPen Inject 10 Units into the skin 3 (three) times daily with meals. 03/23/22   Hoyt Koch, MD  Insulin Pen Needle 32G X 4 MM MISC Use to inject insulin four times daily 06/14/22   Hoyt Koch, MD  meloxicam (MOBIC) 15 MG tablet TAKE 1 TABLET (15 MG TOTAL) BY MOUTH DAILY. 06/14/22   Hoyt Koch, MD  Nutritional Supplements (ENSURE ACTIVE PO) Take 1 Bottle by mouth as needed (nutritional supplements).    [provider]  oxymetazoline (AFRIN) 0.05 % nasal spray Place 1 spray into both nostrils 2 (two) times daily as needed for congestion.    [provider]  rosuvastatin (CRESTOR)  10 MG tablet Take 1 tablet (10 mg total) by mouth daily. 01/07/22   Hoyt Koch, MD  sildenafil (VIAGRA) 100 MG tablet Take 1 tablet (100 mg total) by mouth daily as needed for erectile dysfunction. 02/08/22   Hoyt Koch, MD  sulfamethoxazole-trimethoprim (BACTRIM DS) 800-160 MG tablet Take 1 tablet by mouth 2 (two) times daily. 03/23/22   Hoyt Koch, MD  insulin aspart (NOVOLOG) 100 UNIT/ML FlexPen Inject 3 Units into the skin 3 (three) times daily with meals. 02/28/22 03/01/22  Elodia Florence., MD       Allergies    Patient has no known allergies.    Review of Systems   Review of Systems  Constitutional:  Negative for chills and fever.  Genitourinary:  Positive for dysuria and testicular pain. Negative for decreased urine volume, difficulty urinating, flank pain, frequency, genital sores, hematuria, penile discharge, penile pain, scrotal swelling and urgency.    Physical Exam Updated Vital Signs BP (!) 155/89   Pulse 84   Temp 98.6 F (37 C) (Oral)   Resp 17   SpO2 99%  Physical Exam Vitals and nursing note reviewed. Exam conducted with a chaperone present.  Constitutional:      General: He is not in acute distress.    Appearance: Normal appearance. He is not ill-appearing or diaphoretic.  Cardiovascular:     Rate and Rhythm: Normal rate and regular rhythm.  Pulmonary:     Effort: Pulmonary effort is normal.  Genitourinary:    Penis: Normal. No discharge, swelling or lesions.      Testes: Cremasteric reflex is present.        Right: Tenderness present. Swelling not present.        Left: Tenderness and swelling present.     Comments: Generalized tenderness to bilateral testes with the left being worse than the right.  There is also mild scrotal swelling.  No evidence of penile discharge or lesions.  There does seem to be increased tenderness to palpation posteriorly to testicles.  Negative Prehn sign, patient reports pain is worse with palpation and movement. Neurological:     Mental Status: He is alert. Mental status is at baseline.  Psychiatric:        Mood and Affect: Mood normal.        Behavior: Behavior normal.     ED Results / Procedures / Treatments   Labs (all labs ordered are listed, but only abnormal results are displayed) Labs Reviewed  URINALYSIS, ROUTINE W REFLEX MICROSCOPIC - Abnormal; Notable for the following components:      Result Value   APPearance HAZY (*)    Ketones, ur 5 (*)    Protein, ur 30 (*)    Leukocytes,Ua LARGE (*)    Bacteria, UA  RARE (*)    All other components within normal limits  URINE CULTURE  GC/CHLAMYDIA PROBE AMP ([place]) NOT AT [facility]    EKG None  Radiology No results found.  Procedures Procedures    Medications Ordered in ED Medications  HYDROcodone-acetaminophen (NORCO/VICODIN) 5-325 MG per tablet 1 tablet (1 tablet Oral Given 06/18/22 1931)  cephALEXin (KEFLEX) capsule 1,000 mg (1,000 mg Oral Given 06/18/22 1935)    ED Course/ Medical Decision Making/ A&P                             Medical Decision Making Amount and/or Complexity of Data Reviewed Labs: ordered.  Risk Prescription  drug management.   Patient presents to the ED with concerns of bilateral testicular pain and dysuria that began yesterday.  Patient states his symptoms have been progressively worsening over the last 24 hours and he is now concerned.  He states that it is worse with walking and with palpation of his testicles.  Denies concern for STD/STI, lesion, penile discharge, or other urinary symptoms.  I ordered and personally interpreted UA which shows evidence of a large amount of leukocytes and there is bacteria present.  Suspect patient has a UTI.  Will send urine for culture.  GC/chlamydia urine cytology still pending.  Exam significant for generalized tenderness to bilateral testes with left being worse than the right.  There is also mild scrotal swelling.  No evidence of penile discharge or lesions.  There does seem to be increased tenderness to palpation posteriorly to testicles.  Negative Prehn sign.  Patient reports pain is worse with palpation and movement of testicles.  Will treat patient's pain while in the ED as well as give him his first 2 doses of antibiotic.  Patient will pick up rest of antibiotic tomorrow.  Advised patient to take Tylenol and ibuprofen as needed for discomfort and stressed the importance of really good hydration with water.  Recommended patient follow-up with his primary care physician on  Monday and should he continue to have concerns.  Advised patient to return to ED if his symptoms worsen.  The patient has been appropriately medically screened and/or stabilized in the ED. I have low suspicion for any other emergent medical condition which would require further screening, evaluation or treatment in the ED or require inpatient management. At time of discharge the patient is hemodynamically stable and in no acute distress. I have discussed work-up results and diagnosis with patient and answered all questions. Patient is agreeable with discharge plan. We discussed strict return precautions for returning to the emergency department and they verbalized understanding.          Final Clinical Impression(s) / ED Diagnoses Final diagnoses:  Lower urinary tract infectious disease  Pain in both testicles  Dysuria    Rx / DC Orders ED Discharge Orders          Ordered    cephALEXin (KEFLEX) 500 MG capsule  4 times daily        06/18/22 1935              Theressa Stamps [place], [place] 06/18/22 1939    Fredia Sorrow, MD 06/18/22 [phone]

## 2022-06-18 NOTE — ED Provider Triage Note (Signed)
Emergency Medicine Provider Triage Evaluation Note  Ronald Castaneda , a 67 y.o. male  was evaluated in triage.  Pt complains of left testicular pain that began yesterday.  He also reports mild dysuria.  He states it is more painful with walking.  Denies fever, penile discharge.  Pain was gradual onset and has been worsening over time.    Review of Systems  Positive: As above Negative: As above  Physical Exam  BP 121/86 (BP Location: Left Arm)   Pulse 89   Temp 97.9 F (36.6 C) (Oral)   Resp 16   SpO2 97%  Gen:   Awake, no distress   Resp:  Normal effort  MSK:   Moves extremities without difficulty  Other:  Left testicle is TTP posteriorly; negative Prehn sign  Medical Decision Making  Medically screening exam initiated at 4:03 PM.  Appropriate orders placed.  Ronald Castaneda was informed that the remainder of the evaluation will be completed by another provider, this initial triage assessment does not replace that evaluation, and the importance of remaining in the ED until their evaluation is complete.     Theressa Stamps R, [place] 06/18/22 [phone]

## 2022-06-19 ENCOUNTER — Other Ambulatory Visit (HOSPITAL_COMMUNITY): Payer: Self-pay

## 2022-06-21 LAB — GC/CHLAMYDIA PROBE AMP (~~LOC~~) NOT AT ARMC
Chlamydia: NEGATIVE
Comment: NEGATIVE
Comment: NORMAL
Neisseria Gonorrhea: NEGATIVE

## 2022-06-22 ENCOUNTER — Encounter: Payer: Self-pay | Admitting: Rehabilitative and Restorative Service Providers"

## 2022-06-22 ENCOUNTER — Ambulatory Visit (INDEPENDENT_AMBULATORY_CARE_PROVIDER_SITE_OTHER): Payer: No Typology Code available for payment source | Admitting: Rehabilitative and Restorative Service Providers"

## 2022-06-22 DIAGNOSIS — M79641 Pain in right hand: Secondary | ICD-10-CM | POA: Diagnosis not present

## 2022-06-22 DIAGNOSIS — R278 Other lack of coordination: Secondary | ICD-10-CM

## 2022-06-22 DIAGNOSIS — M6281 Muscle weakness (generalized): Secondary | ICD-10-CM | POA: Diagnosis not present

## 2022-06-22 DIAGNOSIS — M25641 Stiffness of right hand, not elsewhere classified: Secondary | ICD-10-CM

## 2022-06-22 DIAGNOSIS — R600 Localized edema: Secondary | ICD-10-CM

## 2022-06-24 ENCOUNTER — Encounter: Payer: No Typology Code available for payment source | Admitting: Rehabilitative and Restorative Service Providers"

## 2022-07-02 ENCOUNTER — Telehealth: Payer: Self-pay | Admitting: Internal Medicine

## 2022-07-02 ENCOUNTER — Encounter: Payer: Self-pay | Admitting: Internal Medicine

## 2022-07-02 ENCOUNTER — Other Ambulatory Visit: Payer: Self-pay

## 2022-07-02 ENCOUNTER — Other Ambulatory Visit (HOSPITAL_COMMUNITY): Payer: Self-pay

## 2022-07-02 ENCOUNTER — Ambulatory Visit: Payer: No Typology Code available for payment source | Admitting: Internal Medicine

## 2022-07-02 VITALS — BP 150/80 | HR 98 | Temp 97.9°F | Ht 69.0 in | Wt 165.0 lb

## 2022-07-02 DIAGNOSIS — J069 Acute upper respiratory infection, unspecified: Secondary | ICD-10-CM | POA: Insufficient documentation

## 2022-07-02 DIAGNOSIS — E1369 Other specified diabetes mellitus with other specified complication: Secondary | ICD-10-CM | POA: Diagnosis not present

## 2022-07-02 DIAGNOSIS — M79641 Pain in right hand: Secondary | ICD-10-CM | POA: Diagnosis not present

## 2022-07-02 DIAGNOSIS — Z794 Long term (current) use of insulin: Secondary | ICD-10-CM | POA: Diagnosis not present

## 2022-07-02 MED ORDER — FLUTICASONE PROPIONATE 50 MCG/ACT NA SUSP: 2.0000 | Freq: Every day | NASAL | 0 refills | Status: AC

## 2022-07-02 MED ORDER — FLUTICASONE PROPIONATE 50 MCG/ACT NA SUSP
2.0000 | Freq: Every day | NASAL | 0 refills | Status: DC
  Filled 2022-07-02: qty 16, 30d supply, fill #0

## 2022-07-02 NOTE — Assessment & Plan Note (Signed)
Rx flonase to help with symptoms of cold he is having. Lungs clear.

## 2022-07-02 NOTE — Assessment & Plan Note (Signed)
Reviewed release to work note from orthopedics and have given him a copy of note we previously used to describe his long term restrictions due to cervical disease and asked them to consider orthopedic restrictions on right hand as well which are described in their work note which patient was given back during visit.

## 2022-07-02 NOTE — Telephone Encounter (Signed)
PT calls today post visit and request that today's prescription for  fluticasone (FLONASE) 50 MCG/ACT nasal spray [id number] [place] [facility] [place]  [facility] [place] [facility] of [facility].  CB: [phone]

## 2022-07-02 NOTE — Progress Notes (Signed)
   Subjective:   Patient ID: Ronald Castaneda, male    DOB: August 05, 1955, 67 y.o.   MRN: [id number]  HPI The patient is a 67 YO man coming in for cold and follow up hand. Has note with him from orthopedic surgeon about his hand and release to work date 06/28/22. He has not returned to work yet. Wants note documenting his prior work restrictions from [place] in conjunction with restrictions from orthopedic for right hand.  Review of Systems  Constitutional:  Positive for appetite change. Negative for fatigue, fever and unexpected weight change.  HENT:  Positive for congestion. Negative for ear discharge, ear pain, sinus pain, sneezing, sore throat, tinnitus, trouble swallowing and voice change.   Eyes: Negative.   Respiratory:  Positive for cough. Negative for chest tightness, shortness of breath and wheezing.   Cardiovascular: Negative.  Negative for chest pain, palpitations and leg swelling.  Gastrointestinal: Negative.  Negative for abdominal distention, abdominal pain, constipation, diarrhea, nausea and vomiting.  Musculoskeletal: Negative.   Skin: Negative.   Neurological: Negative.   Psychiatric/Behavioral: Negative.      Objective:  Physical Exam Constitutional:      Appearance: He is well-developed.  HENT:     Head: Normocephalic and atraumatic.     Comments: Oropharynx with redness and clear drainage, nose with swollen turbinates, TMs normal bilaterally.  Neck:     Thyroid: No thyromegaly.  Cardiovascular:     Rate and Rhythm: Normal rate and regular rhythm.  Pulmonary:     Effort: Pulmonary effort is normal. No respiratory distress.     Breath sounds: Normal breath sounds. No wheezing or rales.  Abdominal:     General: Bowel sounds are normal. There is no distension.     Palpations: Abdomen is soft.     Tenderness: There is no abdominal tenderness. There is no rebound.  Musculoskeletal:        General: Tenderness present.     Cervical back: Normal range of motion.   Lymphadenopathy:     Cervical: No cervical adenopathy.  Skin:    General: Skin is warm and dry.  Neurological:     Mental Status: He is alert and oriented to person, place, and time.     Coordination: Coordination normal.     Vitals:   07/02/22 0916 07/02/22 0921  BP: (!) 150/80 (!) 150/80  Pulse: 98   Temp: 97.9 F (36.6 C)   TempSrc: Oral   SpO2: 92%   Weight: 165 lb (74.8 kg)   Height: '5\' 9"'$  (1.753 m)     Assessment & Plan:

## 2022-07-02 NOTE — Patient Instructions (Signed)
We have sent in flonase to use 2 sprays each nostril once day for the next 1-2 weeks

## 2022-07-02 NOTE — Assessment & Plan Note (Signed)
Will have to remove libre sensor for CT scan and will then need another sensor sooner than expected. Advised he should be able to get this from pharmacy and we can call if needed to verify he had a reason for shorter than usual usage.

## 2022-07-02 NOTE — Telephone Encounter (Signed)
Done

## 2022-07-03 ENCOUNTER — Other Ambulatory Visit: Payer: Self-pay | Admitting: Internal Medicine

## 2022-07-05 ENCOUNTER — Telehealth: Payer: Self-pay | Admitting: Internal Medicine

## 2022-07-05 MED ORDER — FREESTYLE LIBRE 3 SENSOR MISC
10 refills | Status: DC
  Filled 2022-09-18: qty 2, 28d supply, fill #0

## 2022-07-05 NOTE — Telephone Encounter (Signed)
Flonase was sent in 07/02/22. [facility]

## 2022-07-05 NOTE — Telephone Encounter (Signed)
Pt called wanting a refill for the Rx below:   Refills Start End   Continuous Blood Gluc Sensor (FREESTYLE LIBRE 3 SENSOR) MISC      l for   fluticasone (FLONASE) 50 MCG/ACT nasal spray

## 2022-07-09 ENCOUNTER — Other Ambulatory Visit: Payer: No Typology Code available for payment source

## 2022-08-01 ENCOUNTER — Other Ambulatory Visit: Payer: Self-pay | Admitting: Internal Medicine

## 2022-08-05 ENCOUNTER — Other Ambulatory Visit: Payer: No Typology Code available for payment source

## 2022-08-10 ENCOUNTER — Other Ambulatory Visit: Payer: Self-pay | Admitting: Internal Medicine

## 2022-08-16 ENCOUNTER — Other Ambulatory Visit: Payer: Self-pay

## 2022-08-16 ENCOUNTER — Other Ambulatory Visit: Payer: Self-pay | Admitting: Internal Medicine

## 2022-08-16 ENCOUNTER — Telehealth: Payer: Self-pay | Admitting: Internal Medicine

## 2022-08-16 DIAGNOSIS — E119 Type 2 diabetes mellitus without complications: Secondary | ICD-10-CM

## 2022-08-16 MED ORDER — INSULIN GLARGINE 100 UNIT/ML SOLOSTAR PEN: 18.0000 [IU] | PEN_INJECTOR | Freq: Every day | SUBCUTANEOUS | 11 refills | Status: DC

## 2022-08-16 NOTE — Telephone Encounter (Signed)
Patient wants to get his lantus insulin refilled 100 units  [facility] [address]

## 2022-08-30 ENCOUNTER — Inpatient Hospital Stay: Admission: RE | Admit: 2022-08-30 | Payer: No Typology Code available for payment source | Source: Ambulatory Visit

## 2022-09-15 ENCOUNTER — Ambulatory Visit: Payer: 59 | Admitting: Internal Medicine

## 2022-09-16 ENCOUNTER — Telehealth: Payer: Self-pay | Admitting: Internal Medicine

## 2022-09-16 ENCOUNTER — Other Ambulatory Visit (HOSPITAL_COMMUNITY): Payer: Self-pay

## 2022-09-16 ENCOUNTER — Other Ambulatory Visit: Payer: Self-pay

## 2022-09-16 ENCOUNTER — Ambulatory Visit (INDEPENDENT_AMBULATORY_CARE_PROVIDER_SITE_OTHER): Payer: No Typology Code available for payment source | Admitting: Internal Medicine

## 2022-09-16 VITALS — BP 183/102 | HR 70 | Temp 98.3°F | Resp 16 | Wt 178.0 lb

## 2022-09-16 DIAGNOSIS — B20 Human immunodeficiency virus [HIV] disease: Secondary | ICD-10-CM

## 2022-09-16 MED ORDER — SYMTUZA 800-150-200-10 MG PO TABS
1.0000 | ORAL_TABLET | Freq: Every day | ORAL | 11 refills | Status: AC
  Filled 2022-09-16 – 2022-10-28 (×4): qty 30, 30d supply, fill #0

## 2022-09-16 MED ORDER — TIVICAY 50 MG PO TABS
50.0000 mg | ORAL_TABLET | Freq: Every day | ORAL | 11 refills | Status: AC
  Filled 2022-09-16 – 2022-10-28 (×3): qty 30, 30d supply, fill #0

## 2022-09-16 NOTE — Assessment & Plan Note (Signed)
His infection has been under very good control in the recent past but I am worried that the problems with his pharmacy may have led to reactivation of his virus and possibly more drug resistance.  Pharmacy staff will change his prescriptions to [facility] where he can receive them by mail order.  I will update his lab work today and have him follow-up in 6 to 8 weeks.
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
## 2022-09-17 ENCOUNTER — Other Ambulatory Visit (HOSPITAL_COMMUNITY): Payer: Self-pay

## 2022-09-17 ENCOUNTER — Other Ambulatory Visit: Payer: Self-pay

## 2022-09-17 LAB — T-HELPER CELLS (CD4) COUNT (NOT AT ARMC)
CD4 % Helper T Cell: 23 % — ABNORMAL LOW (ref 33–65)
CD4 T Cell Abs: 550 /uL (ref 400–1790)

## 2022-09-18 ENCOUNTER — Other Ambulatory Visit (HOSPITAL_COMMUNITY): Payer: Self-pay

## 2022-09-18 ENCOUNTER — Encounter (HOSPITAL_COMMUNITY): Payer: Self-pay

## 2022-09-18 MED ORDER — INSULIN LISPRO (1 UNIT DIAL) 100 UNIT/ML (KWIKPEN)
10.0000 [IU] | PEN_INJECTOR | Freq: Three times a day (TID) | SUBCUTANEOUS | 7 refills | Status: DC
  Filled 2022-09-18: qty 9, 30d supply, fill #0

## 2022-09-18 MED ORDER — INSULIN PEN NEEDLE 32G X 4 MM MISC
11 refills | Status: DC
  Filled 2022-09-18: qty 100, 25d supply, fill #0

## 2022-09-18 MED ORDER — INSULIN GLARGINE 100 UNIT/ML SOLOSTAR PEN
18.0000 [IU] | PEN_INJECTOR | Freq: Every day | SUBCUTANEOUS | 0 refills | Status: DC
  Filled 2022-09-18: qty 15, 83d supply, fill #0

## 2022-09-19 LAB — HIV-1 RNA QUANT-NO REFLEX-BLD
HIV 1 RNA Quant: NOT DETECTED Copies/mL
HIV-1 RNA Quant, Log: NOT DETECTED Log cps/mL

## 2022-09-19 LAB — RPR: RPR Ser Ql: NONREACTIVE

## 2022-09-20 ENCOUNTER — Encounter: Payer: Self-pay | Admitting: Pharmacist

## 2022-09-20 ENCOUNTER — Other Ambulatory Visit (HOSPITAL_COMMUNITY): Payer: Self-pay

## 2022-09-20 ENCOUNTER — Other Ambulatory Visit: Payer: Self-pay

## 2022-09-21 NOTE — Telephone Encounter (Signed)
Error

## 2022-09-22 ENCOUNTER — Ambulatory Visit (INDEPENDENT_AMBULATORY_CARE_PROVIDER_SITE_OTHER): Payer: No Typology Code available for payment source | Admitting: Internal Medicine

## 2022-09-22 ENCOUNTER — Encounter: Payer: Self-pay | Admitting: Internal Medicine

## 2022-09-22 VITALS — BP 160/100 | HR 59 | Temp 98.5°F | Ht 69.0 in | Wt 177.0 lb

## 2022-09-22 DIAGNOSIS — I10 Essential (primary) hypertension: Secondary | ICD-10-CM | POA: Diagnosis not present

## 2022-09-22 MED ORDER — AMLODIPINE BESYLATE 10 MG PO TABS: 10.0000 mg | ORAL_TABLET | Freq: Every day | ORAL | 3 refills | Status: DC

## 2022-09-22 NOTE — Patient Instructions (Signed)
We will start you back on the amlodipine to take 1 pill daily.

## 2022-09-22 NOTE — Progress Notes (Signed)
   Subjective:   Patient ID: Ronald Castaneda, male    DOB: 1956-02-28, 67 y.o.   MRN: [id number]  Hypertension Pertinent negatives include no chest pain, palpitations or shortness of breath.   The patient is a 67 YO man coming in for high BP at ID.  Review of Systems  Constitutional: Negative.   HENT: Negative.    Eyes: Negative.   Respiratory:  Negative for cough, chest tightness and shortness of breath.   Cardiovascular:  Negative for chest pain, palpitations and leg swelling.  Gastrointestinal:  Negative for abdominal distention, abdominal pain, constipation, diarrhea, nausea and vomiting.  Musculoskeletal: Negative.   Skin: Negative.   Neurological: Negative.   Psychiatric/Behavioral: Negative.      Objective:  Physical Exam Constitutional:      Appearance: He is well-developed.  HENT:     Head: Normocephalic and atraumatic.  Cardiovascular:     Rate and Rhythm: Normal rate and regular rhythm.  Pulmonary:     Effort: Pulmonary effort is normal. No respiratory distress.     Breath sounds: Normal breath sounds. No wheezing or rales.  Abdominal:     General: Bowel sounds are normal. There is no distension.     Palpations: Abdomen is soft.     Tenderness: There is no abdominal tenderness. There is no rebound.  Musculoskeletal:     Cervical back: Normal range of motion.  Skin:    General: Skin is warm and dry.  Neurological:     Mental Status: He is alert and oriented to person, place, and time.     Coordination: Coordination normal.     Vitals:   09/22/22 0835 09/22/22 0837  BP: (!) 160/100 (!) 160/100  Pulse: (!) 59   Temp: 98.5 F (36.9 C)   TempSrc: Oral   SpO2: 96%   Weight: 177 lb (80.3 kg)   Height: 5\' 9"  (1.753 m)     Assessment & Plan:

## 2022-09-22 NOTE — Assessment & Plan Note (Signed)
BP has elevated moderately. No symptoms. He was stopped BP meds due to low BP with hand infection Oct 2023. Will resume prior medication amlodipine 10 mg daily. New rx done today. Counseled on side effects.

## 2022-09-23 ENCOUNTER — Other Ambulatory Visit: Payer: Self-pay

## 2022-09-24 ENCOUNTER — Other Ambulatory Visit: Payer: Self-pay | Admitting: Internal Medicine

## 2022-09-27 ENCOUNTER — Other Ambulatory Visit: Payer: Self-pay

## 2022-09-27 ENCOUNTER — Other Ambulatory Visit (HOSPITAL_COMMUNITY): Payer: Self-pay

## 2022-09-28 ENCOUNTER — Telehealth: Payer: Self-pay | Admitting: Internal Medicine

## 2022-09-28 NOTE — Telephone Encounter (Signed)
Spoke with patient and informed him of the medication for his BP and also told him to call his pharmacy to check with them about his prescription.

## 2022-09-28 NOTE — Telephone Encounter (Signed)
Patient called and said he was supposed to have a blood pressure medication sent to his pharmacy earlier this week. He doesn't know the name and his pharmacy said they haven't received anything. He would like a call back at [phone].

## 2022-09-29 ENCOUNTER — Other Ambulatory Visit: Payer: Self-pay | Admitting: Internal Medicine

## 2022-09-29 ENCOUNTER — Inpatient Hospital Stay: Admission: RE | Admit: 2022-09-29 | Payer: No Typology Code available for payment source | Source: Ambulatory Visit

## 2022-10-13 ENCOUNTER — Ambulatory Visit: Payer: 59 | Admitting: Internal Medicine

## 2022-10-19 ENCOUNTER — Other Ambulatory Visit (HOSPITAL_COMMUNITY): Payer: Self-pay

## 2022-10-22 ENCOUNTER — Other Ambulatory Visit (HOSPITAL_COMMUNITY): Payer: Self-pay

## 2022-10-25 ENCOUNTER — Encounter (HOSPITAL_COMMUNITY): Payer: Self-pay

## 2022-10-25 ENCOUNTER — Other Ambulatory Visit (HOSPITAL_COMMUNITY): Payer: Self-pay

## 2022-10-27 ENCOUNTER — Other Ambulatory Visit (HOSPITAL_COMMUNITY): Payer: Self-pay

## 2022-10-27 ENCOUNTER — Telehealth: Payer: Self-pay

## 2022-10-27 ENCOUNTER — Ambulatory Visit: Payer: Self-pay | Admitting: Internal Medicine

## 2022-10-27 NOTE — Telephone Encounter (Signed)
[facility]'s Tivicay and Symtuza. Per last provider note, patient was switched to [facility] not [facility] Symtuza and Tivicay together.   Called Kaenan to provide him with phone number for [facility] and asked him to call them to arrange delivery. Asked him to please call back if he has any problems getting his medication.   Sandie Ano, RN

## 2022-10-28 ENCOUNTER — Other Ambulatory Visit (HOSPITAL_COMMUNITY): Payer: Self-pay

## 2022-11-03 ENCOUNTER — Other Ambulatory Visit (HOSPITAL_COMMUNITY): Payer: Self-pay

## 2022-11-03 ENCOUNTER — Other Ambulatory Visit: Payer: Self-pay | Admitting: Internal Medicine

## 2022-11-04 ENCOUNTER — Other Ambulatory Visit (HOSPITAL_COMMUNITY): Payer: Self-pay

## 2022-11-09 ENCOUNTER — Inpatient Hospital Stay: Admission: RE | Admit: 2022-11-09 | Payer: No Typology Code available for payment source | Source: Ambulatory Visit

## 2022-11-12 ENCOUNTER — Other Ambulatory Visit (HOSPITAL_COMMUNITY): Payer: Self-pay

## 2022-11-17 ENCOUNTER — Other Ambulatory Visit: Payer: Self-pay

## 2022-11-23 ENCOUNTER — Other Ambulatory Visit (HOSPITAL_COMMUNITY): Payer: Self-pay

## 2022-11-30 ENCOUNTER — Ambulatory Visit: Payer: Self-pay | Admitting: Internal Medicine

## 2022-12-18 ENCOUNTER — Other Ambulatory Visit: Payer: Self-pay | Admitting: Internal Medicine

## 2022-12-21 ENCOUNTER — Other Ambulatory Visit (HOSPITAL_COMMUNITY): Payer: Self-pay

## 2022-12-21 ENCOUNTER — Other Ambulatory Visit: Payer: Self-pay

## 2023-01-20 ENCOUNTER — Other Ambulatory Visit: Payer: Self-pay | Admitting: Internal Medicine

## 2023-01-25 ENCOUNTER — Telehealth: Payer: Self-pay

## 2023-01-25 NOTE — Telephone Encounter (Signed)
Gave pt # to [facility] [phone] to call to sign up for a Medicare Part D plan.

## 2023-01-31 ENCOUNTER — Ambulatory Visit: Payer: Self-pay | Admitting: Family

## 2023-02-09 ENCOUNTER — Other Ambulatory Visit: Payer: Self-pay | Admitting: Internal Medicine

## 2023-02-21 ENCOUNTER — Ambulatory Visit: Payer: Self-pay | Admitting: Family

## 2023-03-19 ENCOUNTER — Other Ambulatory Visit: Payer: Self-pay | Admitting: Internal Medicine

## 2023-04-21 ENCOUNTER — Other Ambulatory Visit: Payer: Self-pay | Admitting: Internal Medicine

## 2023-06-13 ENCOUNTER — Telehealth: Payer: Self-pay | Admitting: Internal Medicine

## 2023-06-13 ENCOUNTER — Other Ambulatory Visit: Payer: Self-pay

## 2023-06-13 DIAGNOSIS — E119 Type 2 diabetes mellitus without complications: Secondary | ICD-10-CM

## 2023-06-13 MED ORDER — INSULIN GLARGINE 100 UNIT/ML SOLOSTAR PEN: 18.0000 [IU] | PEN_INJECTOR | Freq: Every day | SUBCUTANEOUS | 0 refills | Status: DC

## 2023-06-13 NOTE — Telephone Encounter (Signed)
Copied from CRM [id number]. Topic: Clinical - Prescription Issue >> Jun 13, 2023 10:26 AM Efraim Kaufmann C wrote: Reason for CRM: patient needs to order refills of insulin glargine (LANTUS) 100 UNIT/ML Solostar Pen. Agent was making a refills of med on list and asked pharmacy as [facility] and [facility]. Patient kept stating "it's the [facility]". Agent looked back through all of patient's previous medications and they have never filled a prescription at [facility], asked which [facility] and they stated "the one in [place], I don't have anymore information on it." By this point the call had hung up so agent searched back through medications to be sure and tried calling patient back multiple times but patient did not answer. Agent thinks [facility] may have [facility] but wants to be sure.  Patient was in need of this medication.

## 2023-06-13 NOTE — Telephone Encounter (Signed)
Done

## 2023-06-13 NOTE — Telephone Encounter (Signed)
Please advise 

## 2023-07-10 ENCOUNTER — Other Ambulatory Visit: Payer: Self-pay | Admitting: Internal Medicine

## 2023-09-13 ENCOUNTER — Other Ambulatory Visit: Payer: Self-pay | Admitting: Internal Medicine

## 2023-09-15 NOTE — Telephone Encounter (Signed)
 Patient is calling in to get a update on her insulin  he would like a call back

## 2023-10-12 NOTE — Progress Notes (Signed)
 The ASCVD Risk score (Arnett DK, et al., 2019) failed to calculate for the following reasons:   The systolic blood pressure is missing  Arlon Bergamo, BSN, Charity fundraiser

## 2023-11-22 ENCOUNTER — Telehealth: Payer: Self-pay | Admitting: Internal Medicine

## 2023-11-22 DIAGNOSIS — E119 Type 2 diabetes mellitus without complications: Secondary | ICD-10-CM

## 2023-11-22 NOTE — Telephone Encounter (Unsigned)
 Copied from CRM [id number]. Topic: Clinical - Medication Refill >> Nov 22, 2023 11:13 AM [place] J wrote: Medication: insulin  glargine (LANTUS  SOLOSTAR) 100 UNIT/ML Solostar Pen  Has the patient contacted their pharmacy? No (Agent: If no, request that the patient contact the pharmacy for the refill. If patient does not wish to contact the pharmacy document the reason why and proceed with request.) (Agent: If yes, when and what did the pharmacy advise?)  This is the patient's preferred pharmacy:   [id number] [address], [place] - [address] [place] [address] Phone: [phone] Fax: [phone]  Is this the correct pharmacy for this prescription? Yes If no, delete pharmacy and type the correct one.   Has the prescription been filled recently? No  Is the patient out of the medication? Yes  Has the patient been seen for an appointment in the last year OR does the patient have an upcoming appointment? No  Can we respond through MyChart? No  Agent: Please be advised that Rx refills may take up to 3 business days. We ask that you follow-up with your pharmacy.

## 2023-11-23 MED ORDER — INSULIN GLARGINE 100 UNIT/ML SOLOSTAR PEN: 18.0000 [IU] | PEN_INJECTOR | Freq: Every day | SUBCUTANEOUS | 11 refills | Status: DC

## 2023-12-05 ENCOUNTER — Ambulatory Visit (INDEPENDENT_AMBULATORY_CARE_PROVIDER_SITE_OTHER): Admitting: Internal Medicine

## 2023-12-05 ENCOUNTER — Encounter: Payer: Self-pay | Admitting: Internal Medicine

## 2023-12-05 VITALS — BP 142/86 | HR 88 | Temp 97.6°F | Ht 69.0 in | Wt 173.0 lb

## 2023-12-05 DIAGNOSIS — Z794 Long term (current) use of insulin: Secondary | ICD-10-CM

## 2023-12-05 DIAGNOSIS — B20 Human immunodeficiency virus [HIV] disease: Secondary | ICD-10-CM | POA: Diagnosis not present

## 2023-12-05 DIAGNOSIS — I10 Essential (primary) hypertension: Secondary | ICD-10-CM | POA: Diagnosis not present

## 2023-12-05 DIAGNOSIS — E1369 Other specified diabetes mellitus with other specified complication: Secondary | ICD-10-CM

## 2023-12-05 DIAGNOSIS — Z0000 Encounter for general adult medical examination without abnormal findings: Secondary | ICD-10-CM | POA: Diagnosis not present

## 2023-12-05 DIAGNOSIS — I700 Atherosclerosis of aorta: Secondary | ICD-10-CM

## 2023-12-05 DIAGNOSIS — J410 Simple chronic bronchitis: Secondary | ICD-10-CM

## 2023-12-05 DIAGNOSIS — E7800 Pure hypercholesterolemia, unspecified: Secondary | ICD-10-CM

## 2023-12-05 LAB — HEMOGLOBIN A1C: Hgb A1c MFr Bld: 8.3 % — ABNORMAL HIGH (ref 4.6–6.5)

## 2023-12-05 LAB — CBC
HCT: 43 % (ref 39.0–52.0)
Hemoglobin: 14.3 g/dL (ref 13.0–17.0)
MCHC: 33.2 g/dL (ref 30.0–36.0)
MCV: 76.4 fl — ABNORMAL LOW (ref 78.0–100.0)
Platelets: 259 K/uL (ref 150.0–400.0)
RBC: 5.63 Mil/uL (ref 4.22–5.81)
RDW: 16.3 % — ABNORMAL HIGH (ref 11.5–15.5)
WBC: 8.6 K/uL (ref 4.0–10.5)

## 2023-12-05 LAB — LIPID PANEL
Cholesterol: 158 mg/dL (ref 0–200)
HDL: 31 mg/dL — ABNORMAL LOW (ref >39.00)
LDL Cholesterol: 94 mg/dL (ref 0–99)
NonHDL: 127.42
Total CHOL/HDL Ratio: 5
Triglycerides: 169 mg/dL — ABNORMAL HIGH (ref 0.0–149.0)
VLDL: 33.8 mg/dL (ref 0.0–40.0)

## 2023-12-05 LAB — COMPREHENSIVE METABOLIC PANEL WITH GFR
ALT: 21 U/L (ref 0–53)
AST: 24 U/L (ref 0–37)
Albumin: 3.8 g/dL (ref 3.5–5.2)
Alkaline Phosphatase: 84 U/L (ref 39–117)
BUN: 19 mg/dL (ref 6–23)
CO2: 28 meq/L (ref 19–32)
Calcium: 9 mg/dL (ref 8.4–10.5)
Chloride: 105 meq/L (ref 96–112)
Creatinine, Ser: 1.49 mg/dL (ref 0.40–1.50)
GFR: 48.17 mL/min — ABNORMAL LOW (ref >60.00)
Glucose, Bld: 130 mg/dL — ABNORMAL HIGH (ref 70–99)
Potassium: 3.6 meq/L (ref 3.5–5.1)
Sodium: 142 meq/L (ref 135–145)
Total Bilirubin: 0.2 mg/dL (ref 0.2–1.2)
Total Protein: 8.5 g/dL — ABNORMAL HIGH (ref 6.0–8.3)

## 2023-12-05 LAB — MICROALBUMIN / CREATININE URINE RATIO
Creatinine,U: 223.5 mg/dL
Microalb Creat Ratio: 17 mg/g (ref 0.0–30.0)
Microalb, Ur: 3.8 mg/dL — ABNORMAL HIGH (ref 0.0–1.9)

## 2023-12-05 LAB — VITAMIN B12: Vitamin B-12: 513 pg/mL (ref 211–911)

## 2023-12-05 MED ORDER — AMLODIPINE BESYLATE 10 MG PO TABS: 10.0000 mg | ORAL_TABLET | Freq: Every day | ORAL | 3 refills | Status: DC

## 2023-12-05 MED ORDER — INSULIN PEN NEEDLE 32G X 4 MM MISC: 11 refills | Status: DC

## 2023-12-05 MED ORDER — INSULIN LISPRO (1 UNIT DIAL) 100 UNIT/ML (KWIKPEN): 2.0000 [IU] | PEN_INJECTOR | Freq: Three times a day (TID) | SUBCUTANEOUS | 11 refills | Status: AC

## 2023-12-05 MED ORDER — SILDENAFIL CITRATE 100 MG PO TABS: 100.0000 mg | ORAL_TABLET | ORAL | 5 refills | Status: AC | PRN

## 2023-12-05 MED ORDER — LANTUS SOLOSTAR 100 UNIT/ML ~~LOC~~ SOPN: 15.0000 [IU] | PEN_INJECTOR | Freq: Every day | SUBCUTANEOUS | 11 refills | Status: DC

## 2023-12-05 MED ORDER — ROSUVASTATIN CALCIUM 10 MG PO TABS: 10.0000 mg | ORAL_TABLET | Freq: Every day | ORAL | 3 refills | Status: DC

## 2023-12-05 NOTE — Progress Notes (Unsigned)
   Subjective:   Patient ID: Ronald Castaneda, male    DOB: Jun 12, 1955, 68 y.o.   MRN: [id number]  HPI The patient is a 68 YO man coming in for physical.  PMH, FMH, social history reviewed and updated  Review of Systems  Objective:  Physical Exam  Vitals:   12/05/23 0935  BP: (!) 152/86  Pulse: 88  Temp: 97.6 F (36.4 C)  TempSrc: Temporal  SpO2: 100%  Weight: 173 lb (78.5 kg)  Height: 5' 9 (1.753 m)    Assessment & Plan:

## 2023-12-05 NOTE — Patient Instructions (Signed)
 We have sent in the refills of the amlodipine  and crestor  for cholesterol.  We will check all the labs.  We have sent in the insulin  refills as well.

## 2023-12-06 ENCOUNTER — Encounter: Payer: Self-pay | Admitting: Internal Medicine

## 2023-12-06 LAB — T-HELPER CELLS (CD4) COUNT (NOT AT ARMC)
% CD 4 Pos. Lymph.: 13.3 % — ABNORMAL LOW (ref 30.8–58.5)
Absolute CD 4 Helper: 319 /uL — ABNORMAL LOW (ref 359–1519)
Basophils Absolute: 0.1 x10E3/uL (ref 0.0–0.2)
Basos: 1 %
EOS (ABSOLUTE): 0.1 x10E3/uL (ref 0.0–0.4)
Eos: 2 %
Hematocrit: 49.7 % (ref 37.5–51.0)
Hemoglobin: 15.1 g/dL (ref 13.0–17.7)
Immature Grans (Abs): 0 x10E3/uL (ref 0.0–0.1)
Immature Granulocytes: 0 %
Lymphocytes Absolute: 2.4 x10E3/uL (ref 0.7–3.1)
Lymphs: 27 %
MCH: 25 pg — ABNORMAL LOW (ref 26.6–33.0)
MCHC: 30.4 g/dL — ABNORMAL LOW (ref 31.5–35.7)
MCV: 82 fL (ref 79–97)
Monocytes Absolute: 0.8 x10E3/uL (ref 0.1–0.9)
Monocytes: 9 %
Neutrophils Absolute: 5.4 x10E3/uL (ref 1.4–7.0)
Neutrophils: 61 %
Platelets: 269 x10E3/uL (ref 150–450)
RBC: 6.05 x10E6/uL — ABNORMAL HIGH (ref 4.14–5.80)
RDW: 17.3 % — ABNORMAL HIGH (ref 11.6–15.4)
WBC: 8.8 x10E3/uL (ref 3.4–10.8)

## 2023-12-06 NOTE — Assessment & Plan Note (Signed)
 Not taking crestor  due to running out. Refilled today and checking lipid panel.

## 2023-12-06 NOTE — Assessment & Plan Note (Signed)
 Checking lipid panel and adjust as needed off meds due to running out. Refilled crestor  10 mg daily.

## 2023-12-06 NOTE — Assessment & Plan Note (Signed)
 No assessment or care since his prior provider retired. He was taking HAART. Checking viral load and CD4 count and will work to get him connected with a provider.

## 2023-12-06 NOTE — Assessment & Plan Note (Signed)
 Flu shot yearly. Pneumonia declines. Shingrix declines. Tetanus declines. Colonoscopy up to date. Counseled about sun safety and mole surveillance. Counseled about the dangers of distracted driving. Given 10 year screening recommendations.

## 2023-12-06 NOTE — Assessment & Plan Note (Signed)
 BP elevated off meds. Refilled amlodipine  10 mg daily and will follow up to ensure BP at goal on meds. Checking CMP.

## 2023-12-06 NOTE — Assessment & Plan Note (Signed)
 Checking HGA1c, UACR, CMP, lipid panel and reminded about eye exam. Taking lantus  and humalog  adjust as needed. Is not on ACE-I/ARB or statin currently.

## 2023-12-06 NOTE — Assessment & Plan Note (Signed)
 Denies SOB on exertion today and has quit smoking. Encouraged him to continue lifelong cessation. Has albuterol  prn if needed not using.

## 2023-12-07 LAB — RPR: RPR Ser Ql: NONREACTIVE

## 2023-12-07 LAB — HIV-1 RNA QUANT-NO REFLEX-BLD
HIV 1 RNA Quant: 32700 {copies}/mL — ABNORMAL HIGH
HIV-1 RNA Quant, Log: 4.51 {Log_copies}/mL — ABNORMAL HIGH

## 2023-12-08 ENCOUNTER — Ambulatory Visit: Payer: Self-pay | Admitting: Internal Medicine

## 2023-12-09 NOTE — Progress Notes (Signed)
 Called patient and went over labs results patient states that he does not want to go back on HIV med right now but wil lgo back to taking metformin please send in prescription for patient

## 2023-12-12 ENCOUNTER — Other Ambulatory Visit: Payer: Self-pay | Admitting: Family

## 2023-12-12 MED ORDER — METFORMIN HCL 500 MG PO TABS: 500.0000 mg | ORAL_TABLET | Freq: Two times a day (BID) | ORAL | 3 refills | Status: DC

## 2024-02-15 ENCOUNTER — Other Ambulatory Visit (HOSPITAL_COMMUNITY): Payer: Self-pay

## 2024-02-17 ENCOUNTER — Ambulatory Visit

## 2024-03-12 ENCOUNTER — Encounter: Payer: Self-pay | Admitting: Internal Medicine

## 2024-03-12 ENCOUNTER — Ambulatory Visit: Admitting: Internal Medicine

## 2024-03-12 VITALS — BP 120/72 | HR 59 | Temp 97.9°F | Ht 69.0 in | Wt 166.0 lb

## 2024-03-12 DIAGNOSIS — Z794 Long term (current) use of insulin: Secondary | ICD-10-CM

## 2024-03-12 DIAGNOSIS — E785 Hyperlipidemia, unspecified: Secondary | ICD-10-CM

## 2024-03-12 DIAGNOSIS — R42 Dizziness and giddiness: Secondary | ICD-10-CM | POA: Diagnosis not present

## 2024-03-12 DIAGNOSIS — I10 Essential (primary) hypertension: Secondary | ICD-10-CM

## 2024-03-12 DIAGNOSIS — E1169 Type 2 diabetes mellitus with other specified complication: Secondary | ICD-10-CM | POA: Diagnosis not present

## 2024-03-12 LAB — POCT GLYCOSYLATED HEMOGLOBIN (HGB A1C): Hemoglobin A1C: 10.1 % — AB (ref 4.0–5.6)

## 2024-03-12 MED ORDER — METFORMIN HCL 500 MG PO TABS: 500.0000 mg | ORAL_TABLET | Freq: Two times a day (BID) | ORAL | 3 refills | Status: AC

## 2024-03-12 NOTE — Assessment & Plan Note (Signed)
 Adjustments made to address potential contribution to dizziness. Reduce amlodipine  dose to half a tablet daily.

## 2024-03-12 NOTE — Assessment & Plan Note (Signed)
 Hyperglycemia is poorly controlled with a Hemoglobin A1c of 10% and consistently high blood glucose levels, including morning readings over 200 mg/dL. Symptoms of dizziness and lightheadedness may be due to hyperglycemia and dehydration. Increase Lantus  to 18 units daily. Start metformin  as prescribed 500 mg BID. Continue Humalog  2 units with meals. Monitor blood glucose levels closely and report any hypoglycemic episodes.

## 2024-03-12 NOTE — Assessment & Plan Note (Signed)
Taking crestor 10 mg daily and will continue.  °

## 2024-03-12 NOTE — Patient Instructions (Addendum)
 Your sugars are up today and the HgA1c is 10.1 today. We can go up to 18 units of lantus  as well.  We will add metformin  to take 1 pill daily for the first week then 1 pill twice a day. The most likely side effect is some diarrhea which usually gets better within the first few weeks.  Think about going back to the ID doctor to get back on the medicine.  We will have you take only a half of the amlodipine  instead of a whole pill to help with the lightheadedness.

## 2024-03-12 NOTE — Assessment & Plan Note (Signed)
 Intermittent symptoms may be related to high blood sugar and dehydration, with potential influence from antihypertensive medication. Reduce amlodipine  dose to half a tablet daily (total 5 mg). Monitor for improvement in symptoms as blood sugar levels are managed.

## 2024-03-12 NOTE — Progress Notes (Signed)
 Subjective:   Patient ID: Ronald Castaneda, male    DOB: 1956-03-20, 68 y.o.   MRN: [id number]  Discussed the use of AI scribe software for clinical note transcription with the patient, who gave verbal consent to proceed.  History of Present Illness Ronald Castaneda is a 68 year old male with diabetes who presents with dizziness and high blood sugar levels.  He experiences 'weird dizzy spells' when walking around the house, feeling as though he might pass out. These episodes necessitate stopping or sitting down. During these spells, he also experiences sweating and nervousness.  His blood sugar levels have been consistently high, with morning readings over 200 mg/dL and an J8r of 89%. He is on Lantus  15 units and Humalog  2 units with meals but has not been taking metformin  due to being unaware it was called in. He fears eating due to potential blood sugar spikes, noting a morning level of 250 mg/dL despite fasting.  He has a history of HIV with previous blood work showing a viral load and a drop in CD4 count due to being off his medication. However, he feels good and is primarily concerned about his blood sugar levels.  He has made lifestyle changes, including quitting smoking and drinking, and drinks water regularly. His partner still smokes, but he has quit without aids.  He is concerned about traveling to see his daughter, who is facing serious health issues, due to his own health concerns.  Review of Systems  Constitutional: Negative.   HENT: Negative.    Eyes: Negative.   Respiratory:  Negative for cough, chest tightness and shortness of breath.   Cardiovascular:  Negative for chest pain, palpitations and leg swelling.  Gastrointestinal:  Negative for abdominal distention, abdominal pain, constipation, diarrhea, nausea and vomiting.  Musculoskeletal: Negative.   Skin: Negative.   Neurological:  Positive for dizziness and light-headedness.  Psychiatric/Behavioral: Negative.       Objective:  Physical Exam Constitutional:      Appearance: He is well-developed.  HENT:     Head: Normocephalic and atraumatic.  Cardiovascular:     Rate and Rhythm: Normal rate and regular rhythm.  Pulmonary:     Effort: Pulmonary effort is normal. No respiratory distress.     Breath sounds: Normal breath sounds. No wheezing or rales.  Abdominal:     General: Bowel sounds are normal. There is no distension.     Palpations: Abdomen is soft.     Tenderness: There is no abdominal tenderness.  Musculoskeletal:     Cervical back: Normal range of motion.  Skin:    General: Skin is warm and dry.  Neurological:     Mental Status: He is alert and oriented to person, place, and time.     Coordination: Coordination normal.     Vitals:   03/12/24 0809  BP: 120/72  Pulse: (!) 59  Temp: 97.9 F (36.6 C)  TempSrc: Temporal  SpO2: 95%  Weight: 166 lb (75.3 kg)  Height: 5' 9 (1.753 m)    Assessment and Plan Assessment & Plan Type 2 diabetes mellitus with hyperglycemia   Hyperglycemia is poorly controlled with a Hemoglobin A1c of 10% and consistently high blood glucose levels, including morning readings over 200 mg/dL. Symptoms of dizziness and lightheadedness may be due to hyperglycemia and dehydration. Increase Lantus  to 18 units daily. Start metformin  as prescribed 500 mg BID. Continue Humalog  2 units with meals. Monitor blood glucose levels closely and report any hypoglycemic episodes.  Lightheadedness and dizziness  Intermittent symptoms may be related to high blood sugar and dehydration, with potential influence from antihypertensive medication. Reduce amlodipine  dose to half a tablet daily (total 5 mg). Monitor for improvement in symptoms as blood sugar levels are managed.  Hypertension   Adjustments made to address potential contribution to dizziness. Reduce amlodipine  dose to half a tablet daily.

## 2024-03-13 ENCOUNTER — Telehealth: Payer: Self-pay

## 2024-03-13 NOTE — Telephone Encounter (Signed)
 Copied from CRM [id number]. Topic: Clinical - Prescription Issue >> Mar 13, 2024 12:08 PM Rea ORN wrote: Reason for CRM: PCP told pt to take half of amlodipine  as 5 mg instead of 10 mg. Pt is asking if PCP can order 5 mg because he is having trouble cuttinng in half. Please call back

## 2024-03-14 ENCOUNTER — Other Ambulatory Visit: Payer: Self-pay

## 2024-03-14 MED ORDER — AMLODIPINE BESYLATE 5 MG PO TABS: 5.0000 mg | ORAL_TABLET | Freq: Every day | ORAL | 3 refills | Status: AC

## 2024-03-14 MED ORDER — AMLODIPINE BESYLATE 5 MG PO TABS: 5.0000 mg | ORAL_TABLET | Freq: Every day | ORAL | 3 refills | Status: DC

## 2024-03-14 NOTE — Telephone Encounter (Signed)
 Per DPR LVM amlodipine  5mg  sent in to pharmacy.

## 2024-03-14 NOTE — Telephone Encounter (Signed)
**Note De-identified  [place] Obfuscation** Please advise 

## 2024-03-14 NOTE — Telephone Encounter (Signed)
 Sent in

## 2024-03-14 NOTE — Addendum Note (Signed)
 Addended by: ROLLENE NORRIS A on: 03/14/2024 11:05 AM   Modules accepted: Orders

## 2024-03-14 NOTE — Telephone Encounter (Unsigned)
 Copied from CRM [id number]. Topic: Clinical - Medication Question >> Mar 14, 2024  3:13 PM Aisha D wrote: Reason for CRM: Pt stated that the amLODipine  (NORVASC ) 5 MG tablet was sent to the incorrect pharmacy. Pt stated that he needs the medication sent to [facility] [address], [place] - [address] and would like a callback with an update.

## 2024-03-21 ENCOUNTER — Telehealth: Payer: Self-pay

## 2024-03-21 NOTE — Telephone Encounter (Signed)
 Ronald Castaneda is on the scheduling list and due for an appt. I LVM, no recent MyChart activity.

## 2024-03-23 NOTE — Telephone Encounter (Signed)
 Spoke with Rafan and scheduled for 11/10.  Bilbo Carcamo, BSN, RN

## 2024-04-02 ENCOUNTER — Ambulatory Visit: Admitting: Internal Medicine

## 2024-04-10 ENCOUNTER — Telehealth: Payer: Self-pay

## 2024-04-10 NOTE — Telephone Encounter (Signed)
 Jaxsyn missed 11/10 appointment, called to reschedule. No answer. Left HIPAA compliant voicemail requesting callback.   Jerrard Bradburn, BSN, RN

## 2024-04-16 ENCOUNTER — Ambulatory Visit (INDEPENDENT_AMBULATORY_CARE_PROVIDER_SITE_OTHER)

## 2024-04-16 VITALS — BP 122/68 | HR 82 | Ht 69.0 in | Wt 169.0 lb

## 2024-04-16 DIAGNOSIS — Z1211 Encounter for screening for malignant neoplasm of colon: Secondary | ICD-10-CM

## 2024-04-16 DIAGNOSIS — Z0000 Encounter for general adult medical examination without abnormal findings: Secondary | ICD-10-CM | POA: Diagnosis not present

## 2024-04-16 DIAGNOSIS — Z122 Encounter for screening for malignant neoplasm of respiratory organs: Secondary | ICD-10-CM

## 2024-04-16 DIAGNOSIS — Z87891 Personal history of nicotine dependence: Secondary | ICD-10-CM | POA: Diagnosis not present

## 2024-04-16 NOTE — Progress Notes (Signed)
 Chief Complaint  Patient presents with   Medicare Wellness     Subjective:   Ronald Castaneda is a 68 y.o. male who presents for a Medicare Annual Wellness Visit.  Allergies (verified) Patient has no known allergies.   History: Past Medical History:  Diagnosis Date   Depression    Diabetes mellitus without complication (HCC)    HIV infection (HCC)    Tobacco use    Past Surgical History:  Procedure Laterality Date   I & D EXTREMITY Right 03/09/2022   Procedure: IRRIGATION AND DEBRIDEMENT OF HAND;  Surgeon: Romona Harari, MD;  Location: [facility];  Service: Orthopedics;  Laterality: Right;   None     Family History  Problem Relation Age of Onset   Hypertension Mother    Healthy Sister    Healthy Brother    Diabetes type I Daughter    Social History   Occupational History   Not on file  Tobacco Use   Smoking status: Former    Current packs/day: 0.50    Average packs/day: 0.5 packs/day for 46.0 years (23.0 ttl pk-yrs)    Types: Cigarettes   Smokeless tobacco: Never   Tobacco comments:    stressed at work, unable to decrease at this time; thinking about cutting down it's getting old  Vaping Use   Vaping status: Never Used  Substance and Sexual Activity   Alcohol use: Not Currently   Drug use: No   Sexual activity: Yes    Partners: Female    Birth control/protection: Condom    Comment: given condoms   Tobacco Counseling Counseling given: Not Answered Tobacco comments: stressed at work, unable to decrease at this time; thinking about cutting down it's getting old  SDOH Screenings   Food Insecurity: No Food Insecurity (04/16/2024)  Housing: Unknown (04/16/2024)  Transportation Needs: No Transportation Needs (04/16/2024)  Utilities: Not At Risk (04/16/2024)  Depression (PHQ2-9): Low Risk  (04/16/2024)  Physical Activity: Insufficiently Active (04/16/2024)  Social Connections: Moderately Isolated (04/16/2024)  Stress: No Stress Concern Present  (04/16/2024)  Tobacco Use: Medium Risk (04/16/2024)  Health Literacy: Adequate Health Literacy (04/16/2024)   See flowsheets for full screening details  Depression Screen PHQ 2 & 9 Depression Scale- Over the past 2 weeks, how often have you been bothered by any of the following problems? Little interest or pleasure in doing things: 0 Feeling down, depressed, or hopeless (PHQ Adolescent also includes...irritable): 0 PHQ-2 Total Score: 0 Trouble falling or staying asleep, or sleeping too much: 0 Feeling tired or having little energy: 0 Poor appetite or overeating (PHQ Adolescent also includes...weight loss): 0 Feeling bad about yourself - or that you are a failure or have let yourself or your family down: 0 Trouble concentrating on things, such as reading the newspaper or watching television (PHQ Adolescent also includes...like school work): 0 Moving or speaking so slowly that other people could have noticed. Or the opposite - being so fidgety or restless that you have been moving around a lot more than usual: 0 Thoughts that you would be better off dead, or of hurting yourself in some way: 0 PHQ-9 Total Score: 0 If you checked off any problems, how difficult have these problems made it for you to do your work, take care of things at home, or get along with other people?: Not difficult at all  Depression Treatment Depression Interventions/Treatment : EYV7-0 Score <4 Follow-up Not Indicated     Goals Addressed  This Visit's Progress     Patient Stated (pt-stated)        Patient stated he plans to continue exercising and needs to walk more        Visit info / Clinical Intake: Medicare Wellness Visit Type:: Initial Annual Wellness Visit Persons participating in visit:: patient Medicare Wellness Visit Mode:: In-person (required for WTM) Information given by:: patient Interpreter Needed?: No Pre-visit prep was completed: yes AWV questionnaire completed by patient  prior to visit?: no Living arrangements:: lives with spouse/significant other Patient's Overall Health Status Rating: good Typical amount of pain: none Does pain affect daily life?: no Are you currently prescribed opioids?: no  Dietary Habits and Nutritional Risks How many meals a day?: 3 (2-3) Eats fruit and vegetables daily?: yes Most meals are obtained by: preparing own meals In the last 2 weeks, have you had any of the following?: none Diabetic:: (!) yes Any non-healing wounds?: no How often do you check your BS?: 1; as needed (fasting - 120) Would you like to be referred to a Nutritionist or for Diabetic Management? : no  Functional Status Activities of Daily Living (to include ambulation/medication): Independent Ambulation: Independent with device- listed below Home Assistive Devices/Equipment: Eyeglasses Medication Administration: Independent Home Management: Independent Manage your own finances?: yes Primary transportation is: family/friends (Spouse drives) Concerns about vision?: no *vision screening is required for WTM* Concerns about hearing?: no  Fall Screening Falls in the past year?: 0 Number of falls in past year: 0 Was there an injury with Fall?: 0 Fall Risk Category Calculator: 0 Patient Fall Risk Level: Low Fall Risk  Fall Risk Patient at Risk for Falls Due to: No Fall Risks Fall risk Follow up: Falls evaluation completed; Falls prevention discussed  Home and Transportation Safety: All rugs have non-skid backing?: yes All stairs or steps have railings?: N/A, no stairs Grab bars in the bathtub or shower?: (!) no Have non-skid surface in bathtub or shower?: yes Good home lighting?: yes Regular seat belt use?: yes Hospital stays in the last year:: no  Cognitive Assessment Difficulty concentrating, remembering, or making decisions? : no Will 6CIT or Mini Cog be Completed: yes What year is it?: 0 points What month is it?: 0 points Give patient an  address phrase to remember (5 components): [address] [place], [place] About what time is it?: 0 points Count backwards from 20 to 1: 0 points Say the months of the year in reverse: 0 points Repeat the address phrase from earlier: 0 points 6 CIT Score: 0 points  Advance Directives (For Healthcare) Does Patient Have a Medical Advance Directive?: No Would patient like information on creating a medical advance directive?: No - Patient declined  Reviewed/Updated  Reviewed/Updated: Reviewed All (Medical, Surgical, Family, Medications, Allergies, Care Teams, Patient Goals)        Objective:    Today's Vitals   04/16/24 1359  BP: 122/68  Pulse: 82  SpO2: 98%  Weight: 169 lb (76.7 kg)  Height: 5' 9 (1.753 m)   Body mass index is 24.96 kg/m.  Current Medications (verified) Outpatient Encounter Medications as of 04/16/2024  Medication Sig   albuterol  (VENTOLIN  HFA) 108 (90 Base) MCG/ACT inhaler TAKE 2 PUFFS BY MOUTH EVERY 6 HOURS AS NEEDED FOR WHEEZE OR SHORTNESS OF BREATH   amLODipine  (NORVASC ) 5 MG tablet Take 1 tablet (5 mg total) by mouth daily.   aspirin  81 MG chewable tablet Chew 1 tablet (81 mg total) by mouth daily.   Fingerstix Lancets MISC Use  as needed to check blood sugars up to 4 times daily   fluticasone  (FLONASE ) 50 MCG/ACT nasal spray Place 2 sprays into both nostrils daily.   glucose blood test strip use as needed to check blood sugars up to 4 times daily   ibuprofen (ADVIL) 400 MG tablet Take 400 mg by mouth every 6 (six) hours as needed for mild pain or moderate pain.   insulin  glargine (LANTUS  SOLOSTAR) 100 UNIT/ML Solostar Pen Inject 15 Units into the skin daily.   insulin  lispro (HUMALOG ) 100 UNIT/ML KwikPen Inject 2-5 Units into the skin 3 (three) times daily with meals.   Insulin  Pen Needle 32G X 4 MM MISC Use to inject insulin  four times daily   metFORMIN  (GLUCOPHAGE ) 500 MG tablet Take 1 tablet (500 mg total) by mouth 2 (two) times daily with a meal.    rosuvastatin  (CRESTOR ) 10 MG tablet Take 1 tablet (10 mg total) by mouth daily.   sildenafil  (VIAGRA ) 100 MG tablet Take 1 tablet (100 mg total) by mouth as needed for erectile dysfunction.   cyclobenzaprine  (FLEXERIL ) 5 MG tablet TAKE 1 TABLET BY MOUTH THREE TIMES A DAY AS NEEDED FOR MUSCLE SPASMS (Patient not taking: Reported on 04/16/2024)   [DISCONTINUED] insulin  aspart (NOVOLOG ) 100 UNIT/ML FlexPen Inject 3 Units into the skin 3 (three) times daily with meals.   No facility-administered encounter medications on file as of 04/16/2024.   Hearing/Vision screen Hearing Screening - Comments:: Denies hearing difficulties   Vision Screening - Comments:: Wears rx glasses - up to date with routine eye exams with [facility] Immun[facility] and Health Maintenance Health Maintenance  Topic Date Due   OPHTHALMOLOGY EXAM  Never done   Lung Cancer Screening  04/20/2022   Zoster Vaccines- Shingrix (1 of 2) 07/17/2024 (Originally [birth year]26/1975)   Influenza Vaccine  08/21/2024 (Originally 12/23/2023)   DTaP/Tdap/Td (1 - Tdap) 04/16/2025 (Originally [birth year]26/1975)   Pneumococcal Vaccine: 50+ Years (2 of 2 - PCV) 04/16/2025 (Originally 03/16/2017)   Colonoscopy  04/16/2025 (Originally 02/25/2001)   HEMOGLOBIN A1C  09/10/2024   Diabetic kidney evaluation - eGFR measurement  12/04/2024   Diabetic kidney evaluation - Urine ACR  12/04/2024   FOOT EXAM  12/04/2024   Medicare Annual Wellness (AWV)  04/16/2025   Hepatitis C Screening  Completed   Meningococcal B Vaccine  Aged Out   Hepatitis B Vaccines 19-59 Average Risk  Discontinued   COVID-19 Vaccine  Discontinued        Assessment/Plan:  This is a routine wellness examination for Lorrie.  I have recommended that this patient have a immunization for Tdap, Influenza, Pneumonia, and Shingles but he declines at this time. I have discussed the risks and benefits of this procedure with him. The patient verbalizes understanding. Pt declined to have a Colonoscopy  but prefers to do the Cologuard.  Patient Care Team: Rollene Almarie LABOR, MD as PCP - General (Internal Medicine)  I have personally reviewed and noted the following in the patient's chart:   Medical and social history Use of alcohol, tobacco or illicit drugs  Current medications and supplements including opioid prescriptions. Functional ability and status Nutritional status Physical activity Advanced directives List of other physicians Hospitalizations, surgeries, and ER visits in previous 12 months Vitals Screenings to include cognitive, depression, and falls Referrals and appointments  Orders Placed This Encounter  Procedures   Cologuard   Ambulatory Referral for Lung Cancer Scre    Referral Priority:   Routine    Referral Type:   Consultation  Referral Reason:   Specialty Services Required    Referred to Provider:   Ruthell Lauraine FALCON, NP    Number of Visits Requested:   1   In addition, I have reviewed and discussed with patient certain preventive protocols, quality metrics, and best practice recommendations. A written personalized care plan for preventive services as well as general preventive health recommendations were provided to patient.   Verdie CHRISTELLA Saba, CMA   04/16/2024   No follow-ups on file.  After Visit Summary: (In Person-Declined) Patient declined AVS at this time.  Nurse Notes: Ordered a Cologuard and Lung Cancer Screening (former smoker).  Pt plans to schedule an appt w/[facility] for a Diabetic Eye exam by end of 04/2024.

## 2024-04-16 NOTE — Patient Instructions (Signed)
 Ronald Castaneda,  Thank you for taking the time for your Medicare Wellness Visit. I appreciate your continued commitment to your health goals. Please review the care plan we discussed, and feel free to reach out if I can assist you further.  Please note that Annual Wellness Visits do not include a physical exam. Some assessments may be limited, especially if the visit was conducted virtually. If needed, we may recommend an in-person follow-up with your provider.  Ongoing Care Seeing your primary care provider every 3 to 6 months helps us  monitor your health and provide consistent, personalized care.   Referrals If a referral was made during today's visit and you haven't received any updates within two weeks, please contact the referred provider directly to check on the status.  Recommended Screenings:  Health Maintenance  Topic Date Due   Eye exam for diabetics  Never done   DTaP/Tdap/Td vaccine (1 - Tdap) Never done   Zoster (Shingles) Vaccine (1 of 2) Never done   Colon Cancer Screening  Never done   Pneumococcal Vaccine for age over 69 (2 of 2 - PCV) 03/16/2017   Screening for Lung Cancer  04/20/2022   Flu Shot  08/21/2024*   Hemoglobin A1C  09/10/2024   Yearly kidney function blood test for diabetes  12/04/2024   Yearly kidney health urinalysis for diabetes  12/04/2024   Complete foot exam   12/04/2024   Medicare Annual Wellness Visit  04/16/2025   Hepatitis C Screening  Completed   Meningitis B Vaccine  Aged Out   Hepatitis B Vaccine  Discontinued   COVID-19 Vaccine  Discontinued  *Topic was postponed. The date shown is not the original due date.       06/18/2022    3:51 PM  Advanced Directives  Does Patient Have a Medical Advance Directive? No    Vision: Annual vision screenings are recommended for early detection of glaucoma, cataracts, and diabetic retinopathy. These exams can also reveal signs of chronic conditions such as diabetes and high blood pressure.  Dental:  Annual dental screenings help detect early signs of oral cancer, gum disease, and other conditions linked to overall health, including heart disease and diabetes.

## 2024-04-17 ENCOUNTER — Telehealth: Payer: Self-pay | Admitting: Internal Medicine

## 2024-04-17 NOTE — Telephone Encounter (Signed)
 Copied from CRM #[id number]. Topic: Clinical - Medication Question >> Apr 17, 2024  4:00 PM Terri G wrote: Reason for CRM: Yolanda from [facility] is calling regarding fax that was sent on 11/23 ; they was needing a complete list of patients medications. Wanted to verify if Dr.Crawford received fax. Did verify fax number.

## 2024-04-26 ENCOUNTER — Telehealth: Payer: Self-pay

## 2024-04-26 NOTE — Telephone Encounter (Signed)
 Spoke with pt concerning this message. Pt stated that this company [facility]) was to send medications to his home. There was not a contact number left to return call. Asked pt if he had a number but was unable to locate it at this time. Pt to call back with number for the company so that I can f/u.

## 2024-04-26 NOTE — Telephone Encounter (Signed)
 Copied from CRM #[id number]. Topic: Clinical - Prescription Issue >> Apr 26, 2024  3:09 PM Viola F wrote: Reason for CRM: Patient called to give Danielle the correct number to [facility]. The phone number is [phone]

## 2024-04-27 NOTE — Telephone Encounter (Signed)
 Called company regarding this matter. Took my information and advised that someone would call me back.

## 2024-05-03 NOTE — Telephone Encounter (Signed)
 Spoke with [facility]. Stated that pt has a PPO plan and is eligible for in/out of network. No verification is required.

## 2024-05-03 NOTE — Telephone Encounter (Signed)
Addressed in subsequent message

## 2024-06-04 ENCOUNTER — Encounter: Payer: Self-pay | Admitting: *Deleted

## 2024-06-04 NOTE — Progress Notes (Signed)
 Ronald Castaneda                                          MRN: [id number]   06/04/2024   The [facility] Quality Team Specialist reviewed this patient medical record for the purposes of chart review for care gap closure. The following were reviewed: chart review for care gap closure-glycemic status assessment.    [facility] Quality Team

## 2024-06-11 ENCOUNTER — Encounter: Payer: Self-pay | Admitting: Internal Medicine

## 2024-06-11 ENCOUNTER — Ambulatory Visit: Admitting: Internal Medicine

## 2024-06-11 ENCOUNTER — Ambulatory Visit

## 2024-06-11 VITALS — BP 120/70 | HR 75 | Temp 98.2°F | Ht 69.0 in | Wt 160.6 lb

## 2024-06-11 DIAGNOSIS — R051 Acute cough: Secondary | ICD-10-CM

## 2024-06-11 DIAGNOSIS — B20 Human immunodeficiency virus [HIV] disease: Secondary | ICD-10-CM | POA: Diagnosis not present

## 2024-06-11 DIAGNOSIS — Z794 Long term (current) use of insulin: Secondary | ICD-10-CM

## 2024-06-11 DIAGNOSIS — E1369 Other specified diabetes mellitus with other specified complication: Secondary | ICD-10-CM | POA: Diagnosis not present

## 2024-06-11 LAB — CBC
HCT: 39.4 % (ref 39.0–52.0)
Hemoglobin: 13.2 g/dL (ref 13.0–17.0)
MCHC: 33.5 g/dL (ref 30.0–36.0)
MCV: 75 fl — ABNORMAL LOW (ref 78.0–100.0)
Platelets: 387 K/uL (ref 150.0–400.0)
RBC: 5.25 Mil/uL (ref 4.22–5.81)
RDW: 15.3 % (ref 11.5–15.5)
WBC: 8 K/uL (ref 4.0–10.5)

## 2024-06-11 LAB — COMPREHENSIVE METABOLIC PANEL WITH GFR
ALT: 14 U/L (ref 3–53)
AST: 19 U/L (ref 5–37)
Albumin: 3.4 g/dL — ABNORMAL LOW (ref 3.5–5.2)
Alkaline Phosphatase: 57 U/L (ref 39–117)
BUN: 14 mg/dL (ref 6–23)
CO2: 29 meq/L (ref 19–32)
Calcium: 8.7 mg/dL (ref 8.4–10.5)
Chloride: 100 meq/L (ref 96–112)
Creatinine, Ser: 1.19 mg/dL (ref 0.40–1.50)
GFR: 62.86 mL/min
Glucose, Bld: 101 mg/dL — ABNORMAL HIGH (ref 70–99)
Potassium: 3.3 meq/L — ABNORMAL LOW (ref 3.5–5.1)
Sodium: 140 meq/L (ref 135–145)
Total Bilirubin: 0.4 mg/dL (ref 0.2–1.2)
Total Protein: 7.9 g/dL (ref 6.0–8.3)

## 2024-06-11 LAB — MICROALBUMIN / CREATININE URINE RATIO
Creatinine,U: 573.4 mg/dL
Microalb Creat Ratio: 29 mg/g (ref 0.0–30.0)
Microalb, Ur: 16.6 mg/dL — ABNORMAL HIGH (ref 0.7–1.9)

## 2024-06-11 LAB — HEMOGLOBIN A1C: Hgb A1c MFr Bld: 8.5 % — ABNORMAL HIGH (ref 4.6–6.5)

## 2024-06-11 LAB — LIPASE: Lipase: 34 U/L (ref 11.0–59.0)

## 2024-06-11 MED ORDER — AMOXICILLIN-POT CLAVULANATE 875-125 MG PO TABS: 1.0000 | ORAL_TABLET | Freq: Two times a day (BID) | ORAL | 0 refills | Status: AC

## 2024-06-11 MED ORDER — AZITHROMYCIN 250 MG PO TABS: ORAL_TABLET | ORAL | 0 refills | Status: AC

## 2024-06-11 NOTE — Progress Notes (Signed)
 "  Subjective:   Patient ID: Ronald Castaneda, male    DOB: 08-02-1955, 69 y.o.   MRN: [id number]  Discussed the use of AI scribe software for clinical note transcription with the patient, who gave verbal consent to proceed.  History of Present Illness Ronald Castaneda is a 69 year old male who presents with persistent cough, dizziness, and shortness of breath.  He has been experiencing a persistent cough and congestion for the past two weeks, accompanied by chills and dizziness. The dizziness is severe enough to make him stop while walking.  He experiences sharp pains in the epigastric region, especially when coughing or during bowel movements, requiring him to lay still until they subside. He describes his bowel movements as painful, with a sensation of a clamp on his stomach, causing sharp pains that require him to move gradually. Coughing exacerbates the stomach pain.  He reports feeling short of breath, particularly after exertion, such as taking out the trash, which left him unable to catch his breath and communicate with his wife. His breathing becomes rough when coughing or even while eating, leading to shortness of breath.  He has been sweating profusely, requiring frequent changes of T-shirts. He is concerned about getting sick again due to open pores from sweating.  He has a history of diabetes and notes that his blood sugar levels have been low, dropping to 70, which he attributes to feeling sickly and a lack of appetite. He tries to manage his blood sugar by keeping snacks like honey buns nearby to prevent hypoglycemia. He has been inconsistent with his medication due to his reduced appetite.  No recent chest pain and he confirms he has not smoked cigarettes for a while.  Review of Systems  Constitutional:  Positive for activity change, appetite change, chills and fatigue. Negative for fever and unexpected weight change.  HENT:  Positive for congestion, postnasal drip, rhinorrhea and  sinus pressure. Negative for ear discharge, ear pain, sinus pain, sneezing, sore throat, tinnitus, trouble swallowing and voice change.   Eyes: Negative.   Respiratory:  Positive for cough and shortness of breath. Negative for chest tightness and wheezing.   Cardiovascular: Negative.  Negative for chest pain, palpitations and leg swelling.  Gastrointestinal: Negative.  Negative for abdominal distention, abdominal pain, constipation, diarrhea, nausea and vomiting.  Musculoskeletal:  Positive for arthralgias and myalgias.  Skin: Negative.   Neurological: Negative.   Psychiatric/Behavioral: Negative.      Objective:  Physical Exam Constitutional:      Appearance: He is well-developed.  HENT:     Head: Normocephalic and atraumatic.     Comments: Oropharynx with redness and clear drainage, nose with swollen turbinates, TMs normal bilaterally.  Neck:     Thyroid : No thyromegaly.  Cardiovascular:     Rate and Rhythm: Normal rate and regular rhythm.  Pulmonary:     Effort: Pulmonary effort is normal. No respiratory distress.     Breath sounds: Rhonchi present. No wheezing or rales.  Abdominal:     Palpations: Abdomen is soft.  Musculoskeletal:        General: Tenderness present.     Cervical back: Normal range of motion.  Lymphadenopathy:     Cervical: No cervical adenopathy.  Skin:    General: Skin is warm and dry.  Neurological:     Mental Status: He is alert and oriented to person, place, and time.     Vitals:   06/11/24 0935  BP: 120/70  Pulse: 75  Temp: 98.2  F (36.8 C)  TempSrc: Oral  SpO2: 96%  Weight: 160 lb 9.6 oz (72.8 kg)  Height: 5' 9 (1.753 m)    Assessment and Plan Assessment & Plan Acute respiratory infection, likely pneumonia   Suspected pneumonia due to persistent symptoms and dizziness, despite a clear lung examination. Ordered blood work and chest x-ray. Prescribed azithromycin : two pills on day one, then one daily. Prescribed amoxicillin -clavulanate:  one pill twice daily for one week. Given his HIV will check viral load and CD4 count. If CD4 <250 he could be at risk for opportunistic lung infection which is why we did CXR.   Other specified diabetes mellitus with hypoglycemia risk   Endo has seen and evaluated more likely type 1.5 given late onset but family hx type 1 diabetes (his child has). He is on insulin  regimen and metformin . Hypoglycemia likely due to decreased appetite and medication intake during illness. Checking HgA1c, UACR, CMP and adjust as needed. He has not been to endo in some time.  HIV positive, not on HAART Checking HIV viral load and CD4 count   "

## 2024-06-11 NOTE — Patient Instructions (Addendum)
 We will check the labs today and a chest x-ray.  We have sent in azithromycin  to take 2 pills today. Then starting tomorrow take 1 pill daily until gone.  We have also sent in augmentin  to take 1 pill twice a day for 1 week. '

## 2024-06-12 ENCOUNTER — Ambulatory Visit: Payer: Self-pay | Admitting: Internal Medicine

## 2024-06-13 LAB — HIV-1 RNA QUANT-NO REFLEX-BLD
HIV 1 RNA Quant: 9970 {copies}/mL — ABNORMAL HIGH
HIV-1 RNA Quant, Log: 4 {Log_copies}/mL — ABNORMAL HIGH

## 2024-06-19 ENCOUNTER — Telehealth: Payer: Self-pay

## 2024-06-19 ENCOUNTER — Other Ambulatory Visit: Payer: Self-pay

## 2024-06-19 MED ORDER — INSULIN PEN NEEDLE 32G X 4 MM MISC: 11 refills | Status: DC

## 2024-06-19 MED ORDER — ROSUVASTATIN CALCIUM 10 MG PO TABS: 10.0000 mg | ORAL_TABLET | Freq: Every day | ORAL | 3 refills | Status: DC

## 2024-06-19 MED ORDER — LANTUS SOLOSTAR 100 UNIT/ML ~~LOC~~ SOPN: 15.0000 [IU] | PEN_INJECTOR | Freq: Every day | SUBCUTANEOUS | 11 refills | Status: DC

## 2024-06-19 MED ORDER — LANTUS SOLOSTAR 100 UNIT/ML ~~LOC~~ SOPN: 15.0000 [IU] | PEN_INJECTOR | Freq: Every day | SUBCUTANEOUS | 11 refills | Status: AC

## 2024-06-19 MED ORDER — ROSUVASTATIN CALCIUM 10 MG PO TABS: 10.0000 mg | ORAL_TABLET | Freq: Every day | ORAL | 3 refills | Status: AC

## 2024-06-19 MED ORDER — INSULIN PEN NEEDLE 32G X 4 MM MISC: 11 refills | Status: AC

## 2024-06-19 NOTE — Telephone Encounter (Signed)
 Spoke with pharmacist @[facility]  and notified me that the location given by pt was the wrong location that services the pt. Given a different location and updated in pt's chart. Rxs have been sent in to new location. Pt is aware.

## 2024-06-19 NOTE — Addendum Note (Signed)
 Addended by: EZZARD EDSEL HERO on: 06/19/2024 02:01 PM   Modules accepted: Orders

## 2024-06-19 NOTE — Telephone Encounter (Signed)
 Copied from CRM [id number]. Topic: Clinical - Medication Refill >> Jun 19, 2024  9:55 AM Robinson H wrote: Medication: rosuvastatin  (CRESTOR ) 10 MG tablet, insulin  glargine (LANTUS  SOLOSTAR) 100 UNIT/ML Solostar Pen, Nano pen needles 2nd gen 43zgmm  Has the patient contacted their pharmacy? Yes, has to go through mail order since insurance changed (Agent: If no, request that the patient contact the pharmacy for the refill. If patient does not wish to contact the pharmacy document the reason why and proceed with request.) (Agent: If yes, when and what did the pharmacy advise?)  This is the patient's preferred pharmacy:  [facility] [place] [address] [place] [address] Phone: [phone] Fax: [phone] (Patient wasn't sure which location 2 in system)  Is this the correct pharmacy for this prescription? Yes If no, delete pharmacy and type the correct one.   Has the prescription been filled recently? No  Is the patient out of the medication? Yes  Has the patient been seen for an appointment in the last year OR does the patient have an upcoming appointment? Yes  Can we respond through MyChart? Yes  Agent: Please be advised that Rx refills may take up to 3 business days. We ask that you follow-up with your pharmacy.

## 2024-06-20 ENCOUNTER — Telehealth: Payer: Self-pay

## 2024-06-20 NOTE — Telephone Encounter (Signed)
 Attempt to Re-Engage in Care  No office visit or HIV labs completed at [facility] within the last 12 months. Patient is considered out of care.   Last [facility] Visit: 09/16/22  Last HIV Viral Load:  HIV 1 RNA Quant  Date Value Ref Range Status  06/11/2024 9,970 (H) NOT DETECTED copies/mL Final    Last CD4 Count:   Latest Reference Range & Units 12/05/23 10:08  Absolute CD 4 Helper 359 - 1,519 /uL 319 (L)  % CD 4 Pos. Lymph. 30.8 - 58.5 % 13.3 (L)  (L): Data is abnormally low  Medication Dispense History:   Dispensed Sold Days Supply Quantity Provider Pharmacy  SYMTUZA  800-150-200-10 MG TAB 09/27/2022 No sold data 30 30 each Elaine Rush, MD [facility] [phone] - [facility]...  SYMTUZA  800-150-200-10 MG TAB 08/17/2022 No sold data 30 30 each Elaine Rush, MD [facility] [phone] - [facility]...  SYMTUZA  800-150-200-10 MG TAB 07/20/2022 No sold data 30 30 each Elaine Rush, MD [facility] [phone] - [facility]...    Dispensed Sold Days Supply Quantity Provider Pharmacy  TIVICAY  50 MG TABLET 10/05/2022 No sold data 90 90 each Elaine Rush, MD [facility] [phone] - [facility]...  TIVICAY  50 MG TABLET 07/22/2022 No sold data 90 90 each Elaine Rush, MD [facility] [phone] - [facility]...  TIVICAY  50 MG TABLET 04/16/2022 No sold data 90 90 each Elaine Rush, MD [facility] [phone] - [facility]...   Interventions: Called [facility] to offer appointment. He states he is currently battling a cold. Offered to schedule his appointment a couple of weeks out. He declines and states he is more concerned with his diabetes. Discussed that untreated HIV can worsen complications from diabetes and increase his risk of serious infections. He states he will call if he wants an appointment and disconnected the call.  Duration of Services: 10 minutes  Lillyana Majette, BSN, [facility]

## 2025-04-26 ENCOUNTER — Ambulatory Visit

## 2025-04-26 ENCOUNTER — Encounter: Admitting: Internal Medicine
